# Patient Record
Sex: Male | Born: 1945 | Race: White | Hispanic: No | Marital: Married | Smoking: Never smoker
Health system: Southern US, Community
[De-identification: ages and names within clinical notes are randomized; demographics above are authoritative.]

## PROBLEM LIST (undated history)

## (undated) DIAGNOSIS — I10 Essential (primary) hypertension: Secondary | ICD-10-CM

## (undated) DIAGNOSIS — F329 Major depressive disorder, single episode, unspecified: Secondary | ICD-10-CM

## (undated) DIAGNOSIS — G3109 Other frontotemporal dementia: Secondary | ICD-10-CM

## (undated) DIAGNOSIS — F32A Depression, unspecified: Secondary | ICD-10-CM

## (undated) DIAGNOSIS — G1220 Motor neuron disease, unspecified: Secondary | ICD-10-CM

## (undated) DIAGNOSIS — C801 Malignant (primary) neoplasm, unspecified: Secondary | ICD-10-CM

## (undated) DIAGNOSIS — F0280 Dementia in other diseases classified elsewhere without behavioral disturbance: Secondary | ICD-10-CM

## (undated) HISTORY — DX: Major depressive disorder, single episode, unspecified: F32.9

## (undated) HISTORY — PX: SPINE SURGERY: SHX786

## (undated) HISTORY — DX: Malignant (primary) neoplasm, unspecified: C80.1

## (undated) HISTORY — DX: Depression, unspecified: F32.A

## (undated) HISTORY — DX: Essential (primary) hypertension: I10

---

## 2010-10-01 ENCOUNTER — Ambulatory Visit: Payer: Self-pay | Admitting: Family Medicine

## 2010-10-04 ENCOUNTER — Ambulatory Visit: Payer: Self-pay | Admitting: Family Medicine

## 2010-11-13 ENCOUNTER — Other Ambulatory Visit: Payer: Self-pay | Admitting: Rheumatology

## 2010-12-02 ENCOUNTER — Ambulatory Visit: Payer: Self-pay | Admitting: Family Medicine

## 2010-12-13 ENCOUNTER — Ambulatory Visit: Payer: Self-pay | Admitting: Family Medicine

## 2010-12-15 HISTORY — PX: SKIN CANCER EXCISION: SHX779

## 2011-12-30 DIAGNOSIS — F411 Generalized anxiety disorder: Secondary | ICD-10-CM | POA: Diagnosis not present

## 2011-12-30 DIAGNOSIS — M129 Arthropathy, unspecified: Secondary | ICD-10-CM | POA: Diagnosis not present

## 2011-12-30 DIAGNOSIS — Z23 Encounter for immunization: Secondary | ICD-10-CM | POA: Diagnosis not present

## 2011-12-30 DIAGNOSIS — G3109 Other frontotemporal dementia: Secondary | ICD-10-CM | POA: Diagnosis not present

## 2011-12-30 DIAGNOSIS — F3289 Other specified depressive episodes: Secondary | ICD-10-CM | POA: Diagnosis not present

## 2011-12-30 DIAGNOSIS — F0280 Dementia in other diseases classified elsewhere without behavioral disturbance: Secondary | ICD-10-CM | POA: Diagnosis not present

## 2011-12-30 DIAGNOSIS — F329 Major depressive disorder, single episode, unspecified: Secondary | ICD-10-CM | POA: Diagnosis not present

## 2012-02-13 DIAGNOSIS — F0280 Dementia in other diseases classified elsewhere without behavioral disturbance: Secondary | ICD-10-CM | POA: Diagnosis not present

## 2012-02-13 DIAGNOSIS — L989 Disorder of the skin and subcutaneous tissue, unspecified: Secondary | ICD-10-CM | POA: Diagnosis not present

## 2012-02-17 DIAGNOSIS — D485 Neoplasm of uncertain behavior of skin: Secondary | ICD-10-CM | POA: Diagnosis not present

## 2012-02-17 DIAGNOSIS — B359 Dermatophytosis, unspecified: Secondary | ICD-10-CM | POA: Diagnosis not present

## 2012-02-17 DIAGNOSIS — D1800 Hemangioma unspecified site: Secondary | ICD-10-CM | POA: Diagnosis not present

## 2012-02-17 DIAGNOSIS — L819 Disorder of pigmentation, unspecified: Secondary | ICD-10-CM | POA: Diagnosis not present

## 2012-02-17 DIAGNOSIS — L608 Other nail disorders: Secondary | ICD-10-CM | POA: Diagnosis not present

## 2012-02-24 DIAGNOSIS — L988 Other specified disorders of the skin and subcutaneous tissue: Secondary | ICD-10-CM | POA: Diagnosis not present

## 2012-02-24 DIAGNOSIS — D1800 Hemangioma unspecified site: Secondary | ICD-10-CM | POA: Diagnosis not present

## 2012-02-24 DIAGNOSIS — D485 Neoplasm of uncertain behavior of skin: Secondary | ICD-10-CM | POA: Diagnosis not present

## 2012-02-27 DIAGNOSIS — Z79899 Other long term (current) drug therapy: Secondary | ICD-10-CM | POA: Diagnosis not present

## 2012-02-27 DIAGNOSIS — B359 Dermatophytosis, unspecified: Secondary | ICD-10-CM | POA: Diagnosis not present

## 2012-04-06 DIAGNOSIS — L608 Other nail disorders: Secondary | ICD-10-CM | POA: Diagnosis not present

## 2012-04-06 DIAGNOSIS — R21 Rash and other nonspecific skin eruption: Secondary | ICD-10-CM | POA: Diagnosis not present

## 2012-04-06 DIAGNOSIS — Z79899 Other long term (current) drug therapy: Secondary | ICD-10-CM | POA: Diagnosis not present

## 2012-04-06 DIAGNOSIS — B359 Dermatophytosis, unspecified: Secondary | ICD-10-CM | POA: Diagnosis not present

## 2012-06-01 DIAGNOSIS — H6120 Impacted cerumen, unspecified ear: Secondary | ICD-10-CM | POA: Diagnosis not present

## 2012-07-08 DIAGNOSIS — Z1331 Encounter for screening for depression: Secondary | ICD-10-CM | POA: Diagnosis not present

## 2012-07-08 DIAGNOSIS — F3289 Other specified depressive episodes: Secondary | ICD-10-CM | POA: Diagnosis not present

## 2012-07-08 DIAGNOSIS — Z1339 Encounter for screening examination for other mental health and behavioral disorders: Secondary | ICD-10-CM | POA: Diagnosis not present

## 2012-07-08 DIAGNOSIS — Z0000 Encounter for general adult medical examination without abnormal findings: Secondary | ICD-10-CM | POA: Diagnosis not present

## 2012-07-08 DIAGNOSIS — F329 Major depressive disorder, single episode, unspecified: Secondary | ICD-10-CM | POA: Diagnosis not present

## 2012-07-08 DIAGNOSIS — Z23 Encounter for immunization: Secondary | ICD-10-CM | POA: Diagnosis not present

## 2012-08-09 DIAGNOSIS — Z23 Encounter for immunization: Secondary | ICD-10-CM | POA: Diagnosis not present

## 2012-08-09 DIAGNOSIS — F329 Major depressive disorder, single episode, unspecified: Secondary | ICD-10-CM | POA: Diagnosis not present

## 2012-08-09 DIAGNOSIS — G4730 Sleep apnea, unspecified: Secondary | ICD-10-CM | POA: Diagnosis not present

## 2012-08-09 DIAGNOSIS — F3289 Other specified depressive episodes: Secondary | ICD-10-CM | POA: Diagnosis not present

## 2012-08-17 DIAGNOSIS — F0280 Dementia in other diseases classified elsewhere without behavioral disturbance: Secondary | ICD-10-CM | POA: Diagnosis not present

## 2012-08-17 DIAGNOSIS — G3109 Other frontotemporal dementia: Secondary | ICD-10-CM | POA: Diagnosis not present

## 2012-11-01 DIAGNOSIS — Z23 Encounter for immunization: Secondary | ICD-10-CM | POA: Diagnosis not present

## 2012-11-01 DIAGNOSIS — J069 Acute upper respiratory infection, unspecified: Secondary | ICD-10-CM | POA: Diagnosis not present

## 2012-11-01 DIAGNOSIS — Z1331 Encounter for screening for depression: Secondary | ICD-10-CM | POA: Diagnosis not present

## 2013-01-11 DIAGNOSIS — F0390 Unspecified dementia without behavioral disturbance: Secondary | ICD-10-CM | POA: Diagnosis not present

## 2013-01-11 DIAGNOSIS — G4700 Insomnia, unspecified: Secondary | ICD-10-CM | POA: Diagnosis not present

## 2013-01-11 DIAGNOSIS — R5381 Other malaise: Secondary | ICD-10-CM | POA: Diagnosis not present

## 2013-01-11 DIAGNOSIS — F0280 Dementia in other diseases classified elsewhere without behavioral disturbance: Secondary | ICD-10-CM | POA: Diagnosis not present

## 2013-01-11 DIAGNOSIS — G3109 Other frontotemporal dementia: Secondary | ICD-10-CM | POA: Diagnosis not present

## 2013-01-11 DIAGNOSIS — R5383 Other fatigue: Secondary | ICD-10-CM | POA: Diagnosis not present

## 2013-01-11 DIAGNOSIS — Z79899 Other long term (current) drug therapy: Secondary | ICD-10-CM | POA: Diagnosis not present

## 2013-01-11 LAB — BASIC METABOLIC PANEL
BUN: 16 mg/dL (ref 4–21)
CREATININE: 1.2 mg/dL (ref 0.6–1.3)
GLUCOSE: 87 mg/dL
POTASSIUM: 4.2 mmol/L (ref 3.4–5.3)
SODIUM: 142 mmol/L (ref 137–147)

## 2013-01-11 LAB — HEPATIC FUNCTION PANEL
ALT: 20 U/L (ref 10–40)
AST: 14 U/L (ref 14–40)
Alkaline Phosphatase: 92 U/L (ref 25–125)

## 2013-01-11 LAB — CBC AND DIFFERENTIAL
HEMATOCRIT: 44 % (ref 41–53)
Hemoglobin: 15 g/dL (ref 13.5–17.5)
Neutrophils Absolute: 3 /uL
Platelets: 297 10*3/uL (ref 150–399)
WBC: 6.2 10^3/mL

## 2013-01-11 LAB — TSH: TSH: 0.81 u[IU]/mL (ref 0.41–5.90)

## 2013-03-15 DIAGNOSIS — F0280 Dementia in other diseases classified elsewhere without behavioral disturbance: Secondary | ICD-10-CM | POA: Diagnosis not present

## 2013-03-23 DIAGNOSIS — Z79899 Other long term (current) drug therapy: Secondary | ICD-10-CM | POA: Diagnosis not present

## 2013-03-23 DIAGNOSIS — G4700 Insomnia, unspecified: Secondary | ICD-10-CM | POA: Diagnosis not present

## 2013-03-23 DIAGNOSIS — J329 Chronic sinusitis, unspecified: Secondary | ICD-10-CM | POA: Diagnosis not present

## 2013-03-23 DIAGNOSIS — J209 Acute bronchitis, unspecified: Secondary | ICD-10-CM | POA: Diagnosis not present

## 2013-05-10 DIAGNOSIS — G3109 Other frontotemporal dementia: Secondary | ICD-10-CM | POA: Diagnosis not present

## 2013-05-10 DIAGNOSIS — I10 Essential (primary) hypertension: Secondary | ICD-10-CM | POA: Diagnosis not present

## 2013-05-10 DIAGNOSIS — G4730 Sleep apnea, unspecified: Secondary | ICD-10-CM | POA: Diagnosis not present

## 2013-05-10 DIAGNOSIS — M5412 Radiculopathy, cervical region: Secondary | ICD-10-CM | POA: Diagnosis not present

## 2013-05-10 DIAGNOSIS — F0280 Dementia in other diseases classified elsewhere without behavioral disturbance: Secondary | ICD-10-CM | POA: Diagnosis not present

## 2013-07-12 DIAGNOSIS — G3109 Other frontotemporal dementia: Secondary | ICD-10-CM | POA: Diagnosis not present

## 2013-07-12 DIAGNOSIS — E669 Obesity, unspecified: Secondary | ICD-10-CM | POA: Diagnosis not present

## 2013-07-12 DIAGNOSIS — J309 Allergic rhinitis, unspecified: Secondary | ICD-10-CM | POA: Diagnosis not present

## 2013-07-12 DIAGNOSIS — I10 Essential (primary) hypertension: Secondary | ICD-10-CM | POA: Diagnosis not present

## 2013-07-12 DIAGNOSIS — F0280 Dementia in other diseases classified elsewhere without behavioral disturbance: Secondary | ICD-10-CM | POA: Diagnosis not present

## 2014-03-20 DIAGNOSIS — G3101 Pick's disease: Secondary | ICD-10-CM | POA: Diagnosis not present

## 2014-03-20 DIAGNOSIS — F0280 Dementia in other diseases classified elsewhere without behavioral disturbance: Secondary | ICD-10-CM | POA: Diagnosis not present

## 2014-04-20 DIAGNOSIS — L989 Disorder of the skin and subcutaneous tissue, unspecified: Secondary | ICD-10-CM | POA: Diagnosis not present

## 2014-04-20 DIAGNOSIS — D1039 Benign neoplasm of other parts of mouth: Secondary | ICD-10-CM | POA: Diagnosis not present

## 2014-05-03 ENCOUNTER — Ambulatory Visit: Payer: Self-pay | Admitting: Family Medicine

## 2014-05-03 DIAGNOSIS — J189 Pneumonia, unspecified organism: Secondary | ICD-10-CM | POA: Diagnosis not present

## 2014-05-03 DIAGNOSIS — R509 Fever, unspecified: Secondary | ICD-10-CM | POA: Diagnosis not present

## 2014-05-03 DIAGNOSIS — R059 Cough, unspecified: Secondary | ICD-10-CM | POA: Diagnosis not present

## 2014-05-03 DIAGNOSIS — R079 Chest pain, unspecified: Secondary | ICD-10-CM | POA: Diagnosis not present

## 2014-05-03 DIAGNOSIS — R05 Cough: Secondary | ICD-10-CM | POA: Diagnosis not present

## 2014-05-03 DIAGNOSIS — J42 Unspecified chronic bronchitis: Secondary | ICD-10-CM | POA: Diagnosis not present

## 2014-06-08 DIAGNOSIS — R509 Fever, unspecified: Secondary | ICD-10-CM | POA: Diagnosis not present

## 2014-06-08 DIAGNOSIS — R059 Cough, unspecified: Secondary | ICD-10-CM | POA: Diagnosis not present

## 2014-06-08 DIAGNOSIS — J189 Pneumonia, unspecified organism: Secondary | ICD-10-CM | POA: Diagnosis not present

## 2014-06-08 DIAGNOSIS — R05 Cough: Secondary | ICD-10-CM | POA: Diagnosis not present

## 2014-06-08 DIAGNOSIS — R079 Chest pain, unspecified: Secondary | ICD-10-CM | POA: Diagnosis not present

## 2014-12-27 DIAGNOSIS — H9193 Unspecified hearing loss, bilateral: Secondary | ICD-10-CM | POA: Diagnosis not present

## 2014-12-27 DIAGNOSIS — G3109 Other frontotemporal dementia: Secondary | ICD-10-CM | POA: Diagnosis not present

## 2014-12-27 DIAGNOSIS — H6123 Impacted cerumen, bilateral: Secondary | ICD-10-CM | POA: Diagnosis not present

## 2015-03-21 DIAGNOSIS — F0280 Dementia in other diseases classified elsewhere without behavioral disturbance: Secondary | ICD-10-CM | POA: Diagnosis not present

## 2015-03-21 DIAGNOSIS — G3109 Other frontotemporal dementia: Secondary | ICD-10-CM | POA: Diagnosis not present

## 2015-05-21 ENCOUNTER — Other Ambulatory Visit: Payer: Self-pay | Admitting: Family Medicine

## 2015-05-21 DIAGNOSIS — G4700 Insomnia, unspecified: Secondary | ICD-10-CM

## 2015-05-21 MED ORDER — MIRTAZAPINE 15 MG PO TABS: 15.0000 mg | ORAL_TABLET | Freq: Every day | ORAL | Status: DC

## 2015-06-17 ENCOUNTER — Other Ambulatory Visit: Payer: Self-pay | Admitting: Family Medicine

## 2016-01-05 ENCOUNTER — Other Ambulatory Visit: Payer: Self-pay | Admitting: Family Medicine

## 2016-01-06 NOTE — Telephone Encounter (Signed)
Needs appointment

## 2016-02-27 ENCOUNTER — Other Ambulatory Visit: Payer: Self-pay | Admitting: Family Medicine

## 2016-02-27 NOTE — Telephone Encounter (Signed)
Pt needs appt for f/u--not seen in about 1 year

## 2016-03-12 ENCOUNTER — Other Ambulatory Visit: Payer: Self-pay | Admitting: Family Medicine

## 2016-03-12 ENCOUNTER — Ambulatory Visit (INDEPENDENT_AMBULATORY_CARE_PROVIDER_SITE_OTHER): Payer: Medicare Other | Admitting: Family Medicine

## 2016-03-12 VITALS — BP 122/84 | HR 68 | Temp 97.7°F | Resp 14 | Wt 281.0 lb

## 2016-03-12 DIAGNOSIS — F0280 Dementia in other diseases classified elsewhere without behavioral disturbance: Secondary | ICD-10-CM | POA: Insufficient documentation

## 2016-03-12 DIAGNOSIS — M5412 Radiculopathy, cervical region: Secondary | ICD-10-CM | POA: Insufficient documentation

## 2016-03-12 DIAGNOSIS — F329 Major depressive disorder, single episode, unspecified: Secondary | ICD-10-CM

## 2016-03-12 DIAGNOSIS — G3109 Other frontotemporal dementia: Secondary | ICD-10-CM

## 2016-03-12 DIAGNOSIS — I10 Essential (primary) hypertension: Secondary | ICD-10-CM | POA: Diagnosis not present

## 2016-03-12 DIAGNOSIS — H9193 Unspecified hearing loss, bilateral: Secondary | ICD-10-CM

## 2016-03-12 DIAGNOSIS — M353 Polymyalgia rheumatica: Secondary | ICD-10-CM

## 2016-03-12 DIAGNOSIS — G4730 Sleep apnea, unspecified: Secondary | ICD-10-CM | POA: Insufficient documentation

## 2016-03-12 DIAGNOSIS — J309 Allergic rhinitis, unspecified: Secondary | ICD-10-CM | POA: Insufficient documentation

## 2016-03-12 DIAGNOSIS — F0390 Unspecified dementia without behavioral disturbance: Secondary | ICD-10-CM | POA: Insufficient documentation

## 2016-03-12 DIAGNOSIS — M810 Age-related osteoporosis without current pathological fracture: Secondary | ICD-10-CM | POA: Insufficient documentation

## 2016-03-12 DIAGNOSIS — F32A Depression, unspecified: Secondary | ICD-10-CM | POA: Insufficient documentation

## 2016-03-12 DIAGNOSIS — G8929 Other chronic pain: Secondary | ICD-10-CM | POA: Insufficient documentation

## 2016-03-12 NOTE — Progress Notes (Signed)
Patient ID: Vincent Ayala, male   DOB: [birth year], 70 y.o.   MRN: [id number]    Subjective:  HPI  Hypertension, follow-up:  BP Readings from Last 3 Encounters:  12/27/14 124/82  03/12/16 122/84    He was last seen for hypertension 1 years ago.  BP at that visit was 124/82 . Management since that visit includes none. He reports good compliance with treatment. He is not having side effects.  He is not exercising. He is adherent to low salt diet.   Outside blood pressures are not being checked. He is experiencing none.  Patient denies chest pain, chest pressure/discomfort, claudication, dyspnea, exertional chest pressure/discomfort, fatigue, irregular heart beat, lower extremity edema, near-syncope, orthopnea and palpitations.    Wt Readings from Last 3 Encounters:  12/27/14 276 lb (125.193 kg)  03/12/16 281 lb (127.461 kg)  ------------------------------------------------------------------------   Depression- Pt reports that he feels depressed everyday. He is taking 150 mg Sertraline. Wife reports that she does not know that adjusting medications would help because he is just not used to not doing things that he used to.  Dementia- Wife reports that dementia has gotten worse and does not remember people, but feels like pt is doing well considering the amount of time he has had this disease.   Prior to Admission medications   Medication Sig Start Date End Date Taking? Authorizing Provider  ALPRAZolam Duanne Moron) 0.5 MG tablet Take 0.5 mg by mouth at bedtime as needed for anxiety.    Historical Provider, MD  calcium-vitamin D 250-100 MG-UNIT tablet Take 1 tablet by mouth 2 (two) times daily.    Historical Provider, MD  fluticasone (FLONASE) 50 MCG/ACT nasal spray Place into both nostrils daily.    Historical Provider, MD  lamoTRIgine (LAMICTAL) 100 MG tablet Take 100 mg by mouth 2 (two) times daily.    Historical Provider, MD  losartan (COZAAR) 50 MG tablet Take 50 mg by mouth daily.     Historical Provider, MD  Melatonin 10 MG CAPS Take by mouth.    Historical Provider, MD  memantine (NAMENDA) 10 MG tablet Take 10 mg by mouth 2 (two) times daily.    Historical Provider, MD  mirtazapine (REMERON) 15 MG tablet TAKE ONE TABLET BY MOUTH AT BEDTIME 05/21/15   Jerrol Banana., MD  mirtazapine (REMERON) 15 MG tablet Take 1 tablet (15 mg total) by mouth at bedtime. 05/21/15   Jerrol Banana., MD  Multiple Vitamin (MULTIVITAMIN) tablet Take 1 tablet by mouth daily.    Historical Provider, MD  sertraline (ZOLOFT) 100 MG tablet TAKE ONE & ONE-HALF TABLETS BY MOUTH ONCE DAILY 02/27/16   Jerrol Banana., MD    Patient Active Problem List   Diagnosis Date Noted  . Dementia 03/12/2016  . Depression 03/12/2016  . 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 Sleep apnea 03/12/2016  . Chronic pain 03/12/2016  . 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 Hypertension 03/12/2016  . Allergic rhinitis 03/12/2016  . Osteoporosis 03/12/2016  . Polymyalgia rheumatica ([place]) 03/12/2016  . Cervical radiculopathy 03/12/2016    No past medical history on file.  Social History   Social History  . Marital Status: Married    Spouse Name: N/A  . Number of Children: N/A  . Years of Education: N/A   Occupational History  . Not on file.   Social History Main Topics  . Smoking status: Not on file  . Smokeless tobacco: Not on file  . Alcohol Use: Not on  file  . Drug Use: Not on file  . Sexual Activity: Not on file   Other Topics Concern  . Not on file   Social History Narrative  . No narrative on file    Allergies  Allergen Reactions  . Penicillins     Review of Systems  Constitutional: Negative.   HENT: Negative.   Eyes: Negative.   Respiratory: Negative.   Cardiovascular: Negative.   Gastrointestinal: Negative.   Genitourinary: Negative.   Musculoskeletal: Negative.   Skin: Negative.   Neurological: Negative.   Endo/Heme/Allergies: Negative.     Psychiatric/Behavioral: Positive for depression and memory loss.     There is no immunization history on file for this patient. Objective:  BP 122/84 mmHg  Pulse 68  Temp(Src) 97.7 F (36.5 C) (Oral)  Resp 14  Wt 281 lb (127.461 kg)  Physical Exam  Constitutional: He is oriented to person, place, and time and well-developed, well-nourished, and in no distress.  HENT:  Head: Normocephalic and atraumatic.  Right Ear: External ear normal.  Left Ear: External ear normal.  Nose: Nose normal.  Eyes: Conjunctivae and EOM are normal. Pupils are equal, round, and reactive to light.  Neck: Normal range of motion. Neck supple.  Cardiovascular: Normal rate, regular rhythm, normal heart sounds and intact distal pulses.   Pulmonary/Chest: Effort normal and breath sounds normal.  Abdominal: Soft.  Musculoskeletal: Normal range of motion.  Neurological: He is alert and oriented to person, place, and time. He has normal reflexes. Gait normal. GCS score is 15.  Skin: Skin is warm and dry.  Psychiatric: Mood, affect and judgment normal.    No results found for: WBC, HGB, HCT, PLT, GLUCOSE, CHOL, TRIG, HDL, LDLDIRECT, LDLCALC, TSH, PSA, INR, GLUF, HGBA1C, MICROALBUR  CMP  No results found for: NA, K, CL, CO2, GLUCOSE, BUN, CREATININE, CALCIUM, PROT, ALBUMIN, AST, ALT, ALKPHOS, BILITOT, GFRNONAA, GFRAA  Assessment and Plan :  1. Frontotemporal dementia Progressive. Medications of controlled patient's outbursts of violence and temper. Followed by neurology,Dr Manuella Ghazi, who is doing a great job caring for this patient. More than 50% of this visit spent in counseling and overall management of disease states. 2. Essential hypertension Stable  - CBC with Differential/Platelet - TSH - Comprehensive metabolic panel  3. Sleep apnea Encouraged the use of CPAP. Due to dementia and did not think the patient will be able to cooperate with this request. 4. Depression 5. Poor dentition Strongly  recommended seeing dentist at least to consider pulling teeth and getting dentures. Patient was seen and examined by Dr. Miguel Aschoff, and noted scribed by Webb Laws, CMA I have done the exam and reviewed the above chart and it is accurate to the best of my knowledge.  Miguel Aschoff MD [facility] 03/12/2016 11:36 AM

## 2016-03-13 LAB — COMPREHENSIVE METABOLIC PANEL
ALK PHOS: 74 IU/L (ref 39–117)
ALT: 21 IU/L (ref 0–44)
AST: 18 IU/L (ref 0–40)
Albumin/Globulin Ratio: 1.8 (ref 1.2–2.2)
Albumin: 4.3 g/dL (ref 3.5–4.8)
BILIRUBIN TOTAL: 0.3 mg/dL (ref 0.0–1.2)
BUN / CREAT RATIO: 13 (ref 10–22)
BUN: 16 mg/dL (ref 8–27)
CHLORIDE: 102 mmol/L (ref 96–106)
CO2: 22 mmol/L (ref 18–29)
CREATININE: 1.27 mg/dL (ref 0.76–1.27)
Calcium: 9.3 mg/dL (ref 8.6–10.2)
GFR calc Af Amer: 66 mL/min/{1.73_m2} (ref >59)
GFR calc non Af Amer: 57 mL/min/{1.73_m2} — ABNORMAL LOW (ref >59)
GLUCOSE: 109 mg/dL — AB (ref 65–99)
Globulin, Total: 2.4 g/dL (ref 1.5–4.5)
Potassium: 4.1 mmol/L (ref 3.5–5.2)
Sodium: 142 mmol/L (ref 134–144)
Total Protein: 6.7 g/dL (ref 6.0–8.5)

## 2016-03-13 LAB — CBC WITH DIFFERENTIAL/PLATELET
BASOS ABS: 0.1 10*3/uL (ref 0.0–0.2)
Basos: 1 %
EOS (ABSOLUTE): 0.3 10*3/uL (ref 0.0–0.4)
Eos: 5 %
HEMOGLOBIN: 15 g/dL (ref 12.6–17.7)
Hematocrit: 44 % (ref 37.5–51.0)
Immature Grans (Abs): 0 10*3/uL (ref 0.0–0.1)
Immature Granulocytes: 0 %
LYMPHS ABS: 2.1 10*3/uL (ref 0.7–3.1)
Lymphs: 33 %
MCH: 31.1 pg (ref 26.6–33.0)
MCHC: 34.1 g/dL (ref 31.5–35.7)
MCV: 91 fL (ref 79–97)
MONOCYTES: 8 %
Monocytes Absolute: 0.5 10*3/uL (ref 0.1–0.9)
NEUTROS ABS: 3.4 10*3/uL (ref 1.4–7.0)
Neutrophils: 53 %
PLATELETS: 298 10*3/uL (ref 150–379)
RBC: 4.83 x10E6/uL (ref 4.14–5.80)
RDW: 14.1 % (ref 12.3–15.4)
WBC: 6.3 10*3/uL (ref 3.4–10.8)

## 2016-03-13 LAB — TSH: TSH: 0.898 u[IU]/mL (ref 0.450–4.500)

## 2016-03-19 DIAGNOSIS — G3109 Other frontotemporal dementia: Secondary | ICD-10-CM | POA: Diagnosis not present

## 2016-03-19 DIAGNOSIS — F0280 Dementia in other diseases classified elsewhere without behavioral disturbance: Secondary | ICD-10-CM | POA: Diagnosis not present

## 2016-03-19 DIAGNOSIS — F39 Unspecified mood [affective] disorder: Secondary | ICD-10-CM | POA: Diagnosis not present

## 2016-04-10 ENCOUNTER — Other Ambulatory Visit: Payer: Self-pay

## 2016-04-10 MED ORDER — SERTRALINE HCL 100 MG PO TABS: 150.0000 mg | ORAL_TABLET | Freq: Every day | ORAL | Status: DC

## 2016-05-04 ENCOUNTER — Other Ambulatory Visit: Payer: Self-pay | Admitting: Family Medicine

## 2016-05-05 ENCOUNTER — Other Ambulatory Visit: Payer: Self-pay

## 2016-05-05 MED ORDER — SERTRALINE HCL 100 MG PO TABS: 150.0000 mg | ORAL_TABLET | Freq: Every day | ORAL | Status: DC

## 2016-06-06 ENCOUNTER — Other Ambulatory Visit: Payer: Self-pay | Admitting: Family Medicine

## 2016-06-18 ENCOUNTER — Other Ambulatory Visit: Payer: Self-pay | Admitting: Family Medicine

## 2016-08-07 ENCOUNTER — Other Ambulatory Visit: Payer: Self-pay

## 2016-08-07 MED ORDER — SERTRALINE HCL 100 MG PO TABS: 150.0000 mg | ORAL_TABLET | Freq: Every day | ORAL | 3 refills | Status: DC

## 2016-08-07 MED ORDER — MIRTAZAPINE 15 MG PO TABS: 15.0000 mg | ORAL_TABLET | Freq: Every day | ORAL | 1 refills | Status: DC

## 2016-08-07 MED ORDER — LOSARTAN POTASSIUM 50 MG PO TABS: 50.0000 mg | ORAL_TABLET | Freq: Every day | ORAL | 3 refills | Status: DC

## 2016-08-07 NOTE — Addendum Note (Signed)
Addended by: Arnette Norris on: 08/07/2016 10:40 AM   Modules accepted: Orders

## 2016-08-07 NOTE — Addendum Note (Signed)
Addended by: Arnette Norris on: 08/07/2016 10:41 AM   Modules accepted: Orders

## 2016-10-01 ENCOUNTER — Encounter: Payer: Self-pay | Admitting: Family Medicine

## 2016-10-01 ENCOUNTER — Ambulatory Visit (INDEPENDENT_AMBULATORY_CARE_PROVIDER_SITE_OTHER): Payer: Medicare Other | Admitting: Family Medicine

## 2016-10-01 VITALS — BP 128/78 | HR 78 | Temp 98.0°F | Resp 16 | Ht 76.0 in | Wt 281.0 lb

## 2016-10-01 DIAGNOSIS — Z0000 Encounter for general adult medical examination without abnormal findings: Secondary | ICD-10-CM

## 2016-10-01 DIAGNOSIS — Z23 Encounter for immunization: Secondary | ICD-10-CM

## 2016-10-01 NOTE — Progress Notes (Signed)
Patient: Vincent Ayala, Male    DOB: [birth year], 70 y.o.   MRN: [id number] Visit Date: 10/01/2016  Today's Provider: Wilhemena Durie, MD   Chief Complaint  Patient presents with  . Annual Wellness Exam   Subjective:    Annual wellness visit Vincent Ayala is a 70 y.o. male. He feels well. He reports exercising none. He reports he is sleeping fairly well.  Colonoscopy- never.    Review of Systems  Constitutional: Negative.   HENT: Negative.   Eyes: Negative.   Respiratory: Negative.   Cardiovascular: Negative.   Gastrointestinal: Negative.   Endocrine: Negative.   Genitourinary: Negative.   Musculoskeletal: Negative.   Skin: Negative.   Allergic/Immunologic: Negative.   Neurological: Negative.   Hematological: Negative.   Psychiatric/Behavioral: Negative.     Social History   Social History  . Marital status: Married    Spouse name: N/A  . Number of children: N/A  . Years of education: N/A   Occupational History  . Not on file.   Social History Main Topics  . Smoking status: Never Smoker  . Smokeless tobacco: Not on file  . Alcohol use Not on file  . Drug use: Unknown  . Sexual activity: Not on file   Other Topics Concern  . Not on file   Social History Narrative  . No narrative on file    No past medical history on file.   Patient Active Problem List   Diagnosis Date Noted  . Dementia 03/12/2016  . Depression 03/12/2016  . 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 Sleep apnea 03/12/2016  . Chronic pain 03/12/2016  . 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 Hypertension 03/12/2016  . Allergic rhinitis 03/12/2016  . Osteoporosis 03/12/2016  . Polymyalgia rheumatica ([place]) 03/12/2016  . Cervical radiculopathy 03/12/2016    Past Surgical History:  Procedure Laterality Date  . 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 SPINE SURGERY     ruptured disc    His family history is not  on file.    Current Meds  Medication Sig  . ALPRAZolam (XANAX) 0.5 MG tablet Take 0.5 mg by mouth at bedtime as needed for anxiety.  . calcium-vitamin D 250-100 MG-UNIT tablet Take 1 tablet by mouth 2 (two) times daily.  Marland Kitchen lamoTRIgine (LAMICTAL) 100 MG tablet Take 100 mg by mouth 2 (two) times daily.  Marland Kitchen losartan (COZAAR) 50 MG tablet Take 1 tablet (50 mg total) by mouth daily.  . Melatonin 10 MG CAPS Take by mouth.  . memantine (NAMENDA) 10 MG tablet Take 10 mg by mouth 2 (two) times daily.  . mirtazapine (REMERON) 15 MG tablet Take 1 tablet (15 mg total) by mouth at bedtime.  . Multiple Vitamin (MULTIVITAMIN) tablet Take 1 tablet by mouth daily.  . sertraline (ZOLOFT) 100 MG tablet Take 1.5 tablets (150 mg total) by mouth daily.    Patient Care Team: Jerrol Banana., MD as PCP - General (Family Medicine)    Objective:   Vitals: BP 128/78 (BP Location: Left Arm, Patient Position: Sitting, Cuff Size: Normal)   Pulse 78   Temp 98 F (36.7 C)   Resp 16   Ht 6\' 4"  (1.93 m)   Wt 281 lb (127.5 kg)   BMI 34.20 kg/m   Physical Exam  Constitutional: He is oriented to person, place, and time. He appears well-developed and well-nourished.  HENT:  Head: Normocephalic and atraumatic.  Right Ear: External ear normal.  Left Ear: External ear normal.  Nose: Nose normal.  Mouth/Throat: Oropharynx is clear and moist.  Bilateral TMs occluded with wax.  Patient has poor dentition  Eyes: Conjunctivae and EOM are normal. Pupils are equal, round, and reactive to light.  Neck: Normal range of motion. Neck supple.  Cardiovascular: Normal rate, regular rhythm, normal heart sounds and intact distal pulses.   Pulmonary/Chest: Effort normal and breath sounds normal.  Abdominal: Soft. Bowel sounds are normal.  Musculoskeletal: Normal range of motion.  Neurological: He is alert and oriented to person, place, and time.  Skin: Skin is warm and dry.  Psychiatric: He has a normal mood and affect.  His behavior is normal.    Activities of Daily Living In your present state of health, do you have any difficulty performing the following activities: 10/01/2016 03/12/2016  Hearing? Y N  Vision? N N  Difficulty concentrating or making decisions? Tempie Donning  Walking or climbing stairs? N N  Dressing or bathing? N N  Doing errands, shopping? Y Y  Some recent data might be hidden    Fall Risk Assessment Fall Risk  10/01/2016 03/12/2016  Falls in the past year? No No     Depression Screen PHQ 2/9 Scores 10/01/2016 03/12/2016  PHQ - 2 Score 0 6    Cognitive Testing - 6-CIT  Correct? Score   What year is it? no 4 0 or 4  What month is it? no 3 0 or 3  Memorize:    Vincent Ayala,  41,  [address]  [place],      What time is it? (within 1 hour) no 3 0 or 3  Count backwards from 20 yes 0 0, 2, or 4  Name the months of the year no 4 0, 2, or 4  Repeat name & address above no 8 0, 2, 4, 6, 8, or 10       TOTAL SCORE  22/28   Interpretation:  Abnormal- 22  Normal (0-7) Abnormal (8-28)       Assessment & Plan:     Annual Wellness Visit  Reviewed patient's Family Medical History Reviewed and updated list of patient's medical providers Assessment of cognitive impairment was done Assessed patient's functional ability Established a written schedule for health screening [place] Completed and Reviewed  Exercise Activities and Dietary recommendations Goals    None      Immunization History  Administered Date(s) Administered  . Influenza, High Dose Seasonal PF 10/01/2016  . Pneumococcal Polysaccharide-23 12/30/2011  . Td 07/20/1995    Health Maintenance  Topic Date Due  . Hepatitis C Screening  03-24-1946  . COLONOSCOPY  07/05/1995  . ZOSTAVAX  07/04/2005  . TETANUS/TDAP  07/19/2005  . PNA vac Low Risk Adult (2 of 2 - PCV13) 12/29/2012  . INFLUENZA VACCINE  10/12/2016 (Originally 07/15/2016)      Discussed health benefits of physical activity, and  encouraged him to engage in regular exercise appropriate for his age and condition.   1. Medicare annual wellness visit, subsequent   2. Need for influenza vaccination  - Flu vaccine HIGH DOSE PF (Fluzone High dose)    I have done the exam and reviewed the above chart and it is accurate to the best of my knowledge.  Anayla Giannetti Cranford Mon, MD  [facility]

## 2016-10-01 NOTE — Patient Instructions (Signed)
May start Debrox ear drops once a week to soften wax in both ears.

## 2016-12-17 ENCOUNTER — Other Ambulatory Visit: Payer: Self-pay

## 2016-12-17 MED ORDER — MIRTAZAPINE 15 MG PO TABS: 15.0000 mg | ORAL_TABLET | Freq: Every day | ORAL | 1 refills | Status: DC

## 2016-12-17 NOTE — Telephone Encounter (Signed)
Fax from [facility] stating patient changed pharmacy and they need RX refill for Mirtazapine sent over-aa

## 2017-03-30 DIAGNOSIS — F0280 Dementia in other diseases classified elsewhere without behavioral disturbance: Secondary | ICD-10-CM | POA: Diagnosis not present

## 2017-03-30 DIAGNOSIS — G479 Sleep disorder, unspecified: Secondary | ICD-10-CM | POA: Diagnosis not present

## 2017-03-30 DIAGNOSIS — F39 Unspecified mood [affective] disorder: Secondary | ICD-10-CM | POA: Diagnosis not present

## 2017-03-30 DIAGNOSIS — G3109 Other frontotemporal dementia: Secondary | ICD-10-CM | POA: Diagnosis not present

## 2017-04-01 ENCOUNTER — Encounter: Payer: Self-pay | Admitting: Family Medicine

## 2017-04-01 ENCOUNTER — Ambulatory Visit (INDEPENDENT_AMBULATORY_CARE_PROVIDER_SITE_OTHER): Payer: PPO | Admitting: Family Medicine

## 2017-04-01 VITALS — BP 112/66 | HR 84 | Temp 97.4°F | Resp 16 | Wt 282.0 lb

## 2017-04-01 DIAGNOSIS — I10 Essential (primary) hypertension: Secondary | ICD-10-CM | POA: Diagnosis not present

## 2017-04-01 DIAGNOSIS — Z23 Encounter for immunization: Secondary | ICD-10-CM | POA: Diagnosis not present

## 2017-04-01 DIAGNOSIS — R2681 Unsteadiness on feet: Secondary | ICD-10-CM | POA: Diagnosis not present

## 2017-04-01 DIAGNOSIS — G3109 Other frontotemporal dementia: Secondary | ICD-10-CM | POA: Diagnosis not present

## 2017-04-01 DIAGNOSIS — L989 Disorder of the skin and subcutaneous tissue, unspecified: Secondary | ICD-10-CM

## 2017-04-01 DIAGNOSIS — F3289 Other specified depressive episodes: Secondary | ICD-10-CM

## 2017-04-01 DIAGNOSIS — F0280 Dementia in other diseases classified elsewhere without behavioral disturbance: Secondary | ICD-10-CM | POA: Diagnosis not present

## 2017-04-01 DIAGNOSIS — Z13220 Encounter for screening for lipoid disorders: Secondary | ICD-10-CM

## 2017-04-01 NOTE — Progress Notes (Addendum)
Patient: Vincent Ayala. [place] Male    DOB: January 28, 1946   71 y.o.   MRN: [id number] Visit Date: 04/01/2017  Today's Provider: Wilhemena Durie, MD   Chief Complaint  Patient presents with  . Hypertension  . Depression   Subjective:    HPI      Hypertension, follow-up:  BP Readings from Last 3 Encounters:  04/01/17 112/66  10/01/16 128/78  12/27/14 124/82    He was last seen for hypertension 6 months ago.  BP at that visit was 128/78. Management since that visit includes none. He reports excellent compliance with treatment. He is not having side effects.  He is not exercising. He is adherent to low salt diet.   Outside blood pressures are not being checked. He is experiencing fatigue.  Patient denies chest pain, chest pressure/discomfort, claudication, dyspnea, exertional chest pressure/discomfort, irregular heart beat, lower extremity edema, near-syncope, orthopnea, palpitations and syncope.   Cardiovascular risk factors include advanced age (older than 67 for men, 39 for women), hypertension, male gender and obesity (BMI >= 30 kg/m2).    Weight trend: increasing steadily Wt Readings from Last 3 Encounters:  04/01/17 282 lb (127.9 kg)  10/01/16 281 lb (127.5 kg)  12/27/14 276 lb (125.2 kg)    Current diet: well balanced. Wife states pt eats a cheeseburger from [employer] at least once a day. Pt also has a sweet tooth. Pt eats one danish and two honey buns every day.  ------------------------------------------------------------------------  Depression, Follow-up  He  was last seen for this 6 months ago. Changes made at last visit include none.   He reports excellent compliance with treatment. He is not having side effects.   He reports excellent tolerance of treatment. Current symptoms include: fatigue He feels he is Unchanged since last visit. Pt was having insomnia. Pt saw neurology earlier this week, and Vincent Ayala decreased pt's Mirtazapine to 7.5 mg  po qhs, and increased his Melatonin to 6 mg po qhs. Pt is now sleeping better.  ------------------------------------------------------------------------ Wife reports that pt's gait is becoming unsteady. He is now shuffling his feet. Denies falls.   Allergies  Allergen Reactions  . Penicillins      Current Outpatient Prescriptions:  .  calcium-vitamin D 250-100 MG-UNIT tablet, Take 1 tablet by mouth 2 (two) times daily., Disp: , Rfl:  .  lamoTRIgine (LAMICTAL) 100 MG tablet, Take 100 mg by mouth 2 (two) times daily., Disp: , Rfl:  .  losartan (COZAAR) 50 MG tablet, Take 1 tablet (50 mg total) by mouth daily., Disp: 90 tablet, Rfl: 3 .  Melatonin 3 MG TABS, Take 6 mg by mouth at bedtime. , Disp: , Rfl:  .  memantine (NAMENDA) 10 MG tablet, Take 10 mg by mouth 2 (two) times daily., Disp: , Rfl:  .  mirtazapine (REMERON) 15 MG tablet, Take 1 tablet (15 mg total) by mouth at bedtime. (Patient taking differently: Take 7.5 mg by mouth at bedtime. ), Disp: 90 tablet, Rfl: 1 .  Multiple Vitamin (MULTIVITAMIN) tablet, Take 1 tablet by mouth daily., Disp: , Rfl:  .  sertraline (ZOLOFT) 100 MG tablet, Take 1.5 tablets (150 mg total) by mouth daily., Disp: 150 tablet, Rfl: 3  Review of Systems  Constitutional: Positive for fatigue and unexpected weight change (gain). Negative for activity change, appetite change, chills, diaphoresis and fever.  HENT: Negative.   Eyes: Negative.   Respiratory: Negative for shortness of breath.   Cardiovascular: Negative for chest pain, palpitations and  leg swelling.  Endocrine: Negative.   Musculoskeletal: Negative.   Allergic/Immunologic: Negative.   Hematological: Negative.   Psychiatric/Behavioral: Negative for dysphoric mood, sleep disturbance and suicidal ideas.    Social History  Substance Use Topics  . Smoking status: Never Smoker  . Smokeless tobacco: Never Used  . Alcohol use No   Objective:   BP 112/66 (BP Location: Left Arm, Patient Position:  Sitting, Cuff Size: Large)   Pulse 84   Temp 97.4 F (36.3 C) (Oral)   Resp 16   Wt 282 lb (127.9 kg)   BMI 34.33 kg/m  Vitals:   04/01/17 1352  BP: 112/66  Pulse: 84  Resp: 16  Temp: 97.4 F (36.3 C)  TempSrc: Oral  Weight: 282 lb (127.9 kg)     Physical Exam  Constitutional: He appears well-developed and well-nourished.  HENT:  Head: Normocephalic and atraumatic.  Right Ear: External ear normal.  Left Ear: External ear normal.  Nose: Nose normal.  Eyes: Conjunctivae are normal.  Neck: Normal range of motion.  Cardiovascular: Normal rate, regular rhythm and normal heart sounds.   No carotid bruit  Pulmonary/Chest: Effort normal and breath sounds normal. No respiratory distress.  Abdominal: Soft.  Neurological: He is alert. No cranial nerve deficit. He exhibits normal muscle tone. Coordination normal.  Gait normal in office today.  Skin: Skin is warm and dry.  Left preauricular lesion c/w BCC.  Psychiatric: His behavior is normal.        Assessment & Plan:     1. Essential hypertension Stable. FU pending labs. - CBC with Differential/Platelet - Comprehensive metabolic panel - TSH  2. Other depression Stable.  3. Frontotemporal dementia F/B neuro. FU as scheduled.  4. Unsteady gait Discussed falls preventions.  5. Screening for cholesterol level FY pending results. - Lipid panel  6. Skin lesion Refer to R/O basal cell carcinoma. - Ambulatory referral to Dermatology  7. Need for pneumococcal vaccination Administered today. Tdap due. Advised pt and wife to call to schedule if pt has laceration. - Pneumococcal conjugate vaccine 13-valent IM     Patient seen and examined by Miguel Aschoff, MD, and note scribed by Renaldo Fiddler, CMA. I have done the exam and reviewed the above chart and it is accurate to the best of my knowledge. Development worker, community has been used in this note in any air is in the dictation or transcription are  unintentional.  Wilhemena Durie, MD  [facility] [facility]

## 2017-04-27 DIAGNOSIS — D2262 Melanocytic nevi of left upper limb, including shoulder: Secondary | ICD-10-CM | POA: Diagnosis not present

## 2017-04-27 DIAGNOSIS — D225 Melanocytic nevi of trunk: Secondary | ICD-10-CM | POA: Diagnosis not present

## 2017-04-27 DIAGNOSIS — D2261 Melanocytic nevi of right upper limb, including shoulder: Secondary | ICD-10-CM | POA: Diagnosis not present

## 2017-04-27 DIAGNOSIS — C44219 Basal cell carcinoma of skin of left ear and external auricular canal: Secondary | ICD-10-CM | POA: Diagnosis not present

## 2017-04-27 DIAGNOSIS — C44519 Basal cell carcinoma of skin of other part of trunk: Secondary | ICD-10-CM | POA: Diagnosis not present

## 2017-04-27 DIAGNOSIS — D485 Neoplasm of uncertain behavior of skin: Secondary | ICD-10-CM | POA: Diagnosis not present

## 2017-06-02 ENCOUNTER — Other Ambulatory Visit: Payer: Self-pay | Admitting: Family Medicine

## 2017-06-19 DIAGNOSIS — L905 Scar conditions and fibrosis of skin: Secondary | ICD-10-CM | POA: Diagnosis not present

## 2017-06-19 DIAGNOSIS — C44319 Basal cell carcinoma of skin of other parts of face: Secondary | ICD-10-CM | POA: Diagnosis not present

## 2017-06-26 DIAGNOSIS — C44519 Basal cell carcinoma of skin of other part of trunk: Secondary | ICD-10-CM | POA: Diagnosis not present

## 2017-07-09 ENCOUNTER — Ambulatory Visit (INDEPENDENT_AMBULATORY_CARE_PROVIDER_SITE_OTHER): Payer: PPO

## 2017-07-09 VITALS — BP 132/78 | HR 72 | Temp 98.9°F | Ht 76.0 in | Wt 274.4 lb

## 2017-07-09 DIAGNOSIS — Z0000 Encounter for general adult medical examination without abnormal findings: Secondary | ICD-10-CM

## 2017-07-09 NOTE — Progress Notes (Signed)
Subjective:   Vincent Ayala is a 71 y.o. male who presents for Medicare Annual/Subsequent preventive examination.  Review of Systems:  N/A  Cardiac Risk Factors include: advanced age (>80men, >57 women);hypertension;male gender;obesity (BMI >30kg/m2)     Objective:    Vitals: BP 132/78 (BP Location: Right Arm)   Pulse 72   Temp 98.9 F (37.2 C) (Oral)   Ht 6\' 4"  (1.93 m)   Wt 274 lb 6.4 oz (124.5 kg)   BMI 33.40 kg/m   Body mass index is 33.4 kg/m.  Tobacco History  Smoking Status  . Never Smoker  Smokeless Tobacco  . Never Used     Counseling given: Not Answered   Past Medical History:  Diagnosis Date  . Cancer ([place])    [place] on left earlobe and back   Past Surgical History:  Procedure Laterality Date  . 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 SPINE SURGERY     ruptured disc   History reviewed. No pertinent family history. History  Sexual Activity  . Sexual activity: Not on file    Outpatient Encounter Prescriptions as of 07/09/2017  Medication Sig  . calcium-vitamin D 250-100 MG-UNIT tablet Take 1 tablet by mouth 2 (two) times daily.  Marland Kitchen lamoTRIgine (LAMICTAL) 100 MG tablet Take 100 mg by mouth 2 (two) times daily.  Marland Kitchen losartan (COZAAR) 50 MG tablet TAKE ONE TABLET EVERY DAY  . Melatonin 3 MG TABS Take 6 mg by mouth at bedtime.   . memantine (NAMENDA) 10 MG tablet Take 10 mg by mouth daily.   . mirtazapine (REMERON) 15 MG tablet Take 1 tablet (15 mg total) by mouth at bedtime.  . Multiple Vitamin (MULTIVITAMIN) tablet Take 1 tablet by mouth daily.  . sertraline (ZOLOFT) 100 MG tablet TAKE 1 AND 1/2 TABLET EVERY DAY   No facility-administered encounter medications on file as of 07/09/2017.     Activities of Daily Living In your present state of health, do you have any difficulty performing the following activities: 07/09/2017 10/01/2016  Hearing? Tempie Donning  Vision? N N  Difficulty concentrating or making  decisions? Tempie Donning  Walking or climbing stairs? Y N  Dressing or bathing? N N  Doing errands, shopping? Tempie Donning  Preparing Food and eating ? N -  Using the Toilet? N -  In the past six months, have you accidently leaked urine? N -  Do you have problems with loss of bowel control? N -  Managing your Medications? Y -  Managing your Finances? Y -  Housekeeping or managing your Housekeeping? N -  Some recent data might be hidden    Patient Care Team: Jerrol Banana., MD as PCP - General (Family Medicine) Vladimir Crofts, MD as Consulting Physician (Neurology) Oneta Rack, MD as Consulting Physician (Dermatology)   Assessment:     Exercise Activities and Dietary recommendations Current Exercise Habits: The patient does not participate in regular exercise at present  Goals    . Increase water intake          Recommend increasing water intake to 3 glasses a day.       Fall Risk Fall Risk  07/09/2017 10/01/2016 03/12/2016  Falls in the past year? No No No   Depression Screen PHQ 2/9 Scores 07/09/2017 10/01/2016 03/12/2016  PHQ - 2 Score 2 0 6  PHQ- 9 Score 8 - -    Cognitive  Function- declined screening today.        Immunization History  Administered Date(s) Administered  . Influenza, High Dose Seasonal PF 10/01/2016  . Pneumococcal Conjugate-13 04/01/2017  . Pneumococcal Polysaccharide-23 12/30/2011  . Td 07/20/1995   Screening Tests Health Maintenance  Topic Date Due  . Hepatitis C Screening  10/06/2017 (Originally 1946/03/15)  . COLONOSCOPY  12/15/2026 (Originally 07/05/1995)  . TETANUS/TDAP  12/15/2026 (Originally 07/19/2005)  . INFLUENZA VACCINE  07/15/2017  . PNA vac Low Risk Adult  Completed      Plan:  I have personally reviewed and addressed the Medicare Annual Wellness questionnaire and have noted the following in the patient's chart:  A. Medical and social history B. Use of alcohol, tobacco or illicit drugs  C. Current medications and  supplements D. Functional ability and status E.  Nutritional status F.  Physical activity G. Advance directives H. List of other physicians I.  Hospitalizations, surgeries, and ER visits in previous 12 months J.  [place] such as hearing and vision if needed, cognitive and depression L. Referrals and appointments - none  In addition, I have reviewed and discussed with patient certain preventive protocols, quality metrics, and best practice recommendations. A written personalized care plan for preventive services as well as general preventive health recommendations were provided to patient.  See attached scanned questionnaire for additional information.   Signed,  Fabio Neighbors, LPN Nurse Health Advisor   MD Recommendations: Pt would like to have the Hepatitis C screening done at next OV on 10/07/17. Pt declined colonoscopy referral and tetanus vaccine today.

## 2017-07-09 NOTE — Patient Instructions (Signed)
Vincent Ayala , Thank you for taking time to come for your Medicare Wellness Visit. I appreciate your ongoing commitment to your health goals. Please review the following plan we discussed and let me know if I can assist you in the future.   Screening recommendations/referrals: Colonoscopy: declined Recommended yearly ophthalmology/optometry visit for glaucoma screening and checkup Recommended yearly dental visit for hygiene and checkup  Vaccinations: Influenza vaccine: due 08/2017 Pneumococcal vaccine: completed series Tdap vaccine: declined Shingles vaccine: declined   Advanced directives: Please bring a copy of your POA (Power of Attorney) and/or Living Will to your next appointment.   Conditions/risks identified: Recommend increasing water intake to 3 glasses a day.   Next appointment: 10/07/17  Preventive Care 72 Years and Older, Male Preventive care refers to lifestyle choices and visits with your health care provider that can promote health and wellness. What does preventive care include?  A yearly physical exam. This is also called an annual well check.  Dental exams once or twice a year.  Routine eye exams. Ask your health care provider how often you should have your eyes checked.  Personal lifestyle choices, including:  Daily care of your teeth and gums.  Regular physical activity.  Eating a healthy diet.  Avoiding tobacco and drug use.  Limiting alcohol use.  Practicing safe sex.  Taking low doses of aspirin every day.  Taking vitamin and mineral supplements as recommended by your health care provider. What happens during an annual well check? The services and screenings done by your health care provider during your annual well check will depend on your age, overall health, lifestyle risk factors, and family history of disease. Counseling  Your health care provider may ask you questions about your:  Alcohol use.  Tobacco use.  Drug use.  Emotional  well-being.  Home and relationship well-being.  Sexual activity.  Eating habits.  History of falls.  Memory and ability to understand (cognition).  Work and work Statistician. Screening  You may have the following tests or measurements:  Height, weight, and BMI.  Blood pressure.  Lipid and cholesterol levels. These may be checked every 5 years, or more frequently if you are over 38 years old.  Skin check.  Lung cancer screening. You may have this screening every year starting at age 69 if you have a 30-pack-year history of smoking and currently smoke or have quit within the past 15 years.  Fecal occult blood test (FOBT) of the stool. You may have this test every year starting at age 21.  Flexible sigmoidoscopy or colonoscopy. You may have a sigmoidoscopy every 5 years or a colonoscopy every 10 years starting at age 35.  Prostate cancer screening. Recommendations will vary depending on your family history and other risks.  Hepatitis C blood test.  Hepatitis B blood test.  Sexually transmitted disease (STD) testing.  Diabetes screening. This is done by checking your blood sugar (glucose) after you have not eaten for a while (fasting). You may have this done every 1-3 years.  Abdominal aortic aneurysm (AAA) screening. You may need this if you are a current or former smoker.  Osteoporosis. You may be screened starting at age 69 if you are at high risk. Talk with your health care provider about your test results, treatment options, and if necessary, the need for more tests. Vaccines  Your health care provider may recommend certain vaccines, such as:  Influenza vaccine. This is recommended every year.  Tetanus, diphtheria, and acellular pertussis (Tdap, Td) vaccine.  You may need a Td booster every 10 years.  Zoster vaccine. You may need this after age 64.  Pneumococcal 13-valent conjugate (PCV13) vaccine. One dose is recommended after age 60.  Pneumococcal  polysaccharide (PPSV23) vaccine. One dose is recommended after age 23. Talk to your health care provider about which screenings and vaccines you need and how often you need them. This information is not intended to replace advice given to you by your health care provider. Make sure you discuss any questions you have with your health care provider. Document Released: 12/28/2015 Document Revised: 08/20/2016 Document Reviewed: 10/02/2015 Elsevier Interactive Patient Education  2017 [employer] Prevention in the Home Falls can cause injuries. They can happen to people of all ages. There are many things you can do to make your home safe and to help prevent falls. What can I do on the outside of my home?  Regularly fix the edges of walkways and driveways and fix any cracks.  Remove anything that might make you trip as you walk through a door, such as a raised step or threshold.  Trim any bushes or trees on the path to your home.  Use bright outdoor lighting.  Clear any walking paths of anything that might make someone trip, such as rocks or tools.  Regularly check to see if handrails are loose or broken. Make sure that both sides of any steps have handrails.  Any raised decks and porches should have guardrails on the edges.  Have any leaves, snow, or ice cleared regularly.  Use sand or salt on walking paths during winter.  Clean up any spills in your garage right away. This includes oil or grease spills. What can I do in the bathroom?  Use night lights.  Install grab bars by the toilet and in the tub and shower. Do not use towel bars as grab bars.  Use non-skid mats or decals in the tub or shower.  If you need to sit down in the shower, use a plastic, non-slip stool.  Keep the floor dry. Clean up any water that spills on the floor as soon as it happens.  Remove soap buildup in the tub or shower regularly.  Attach bath mats securely with double-sided non-slip rug  tape.  Do not have throw rugs and other things on the floor that can make you trip. What can I do in the bedroom?  Use night lights.  Make sure that you have a light by your bed that is easy to reach.  Do not use any sheets or blankets that are too big for your bed. They should not hang down onto the floor.  Have a firm chair that has side arms. You can use this for support while you get dressed.  Do not have throw rugs and other things on the floor that can make you trip. What can I do in the kitchen?  Clean up any spills right away.  Avoid walking on wet floors.  Keep items that you use a lot in easy-to-reach places.  If you need to reach something above you, use a strong step stool that has a grab bar.  Keep electrical cords out of the way.  Do not use floor polish or wax that makes floors slippery. If you must use wax, use non-skid floor wax.  Do not have throw rugs and other things on the floor that can make you trip. What can I do with my stairs?  Do not leave any  items on the stairs.  Make sure that there are handrails on both sides of the stairs and use them. Fix handrails that are broken or loose. Make sure that handrails are as long as the stairways.  Check any carpeting to make sure that it is firmly attached to the stairs. Fix any carpet that is loose or worn.  Avoid having throw rugs at the top or bottom of the stairs. If you do have throw rugs, attach them to the floor with carpet tape.  Make sure that you have a light switch at the top of the stairs and the bottom of the stairs. If you do not have them, ask someone to add them for you. What else can I do to help prevent falls?  Wear shoes that:  Do not have high heels.  Have rubber bottoms.  Are comfortable and fit you well.  Are closed at the toe. Do not wear sandals.  If you use a stepladder:  Make sure that it is fully opened. Do not climb a closed stepladder.  Make sure that both sides of the  stepladder are locked into place.  Ask someone to hold it for you, if possible.  Clearly mark and make sure that you can see:  Any grab bars or handrails.  First and last steps.  Where the edge of each step is.  Use tools that help you move around (mobility aids) if they are needed. These include:  Canes.  Walkers.  Scooters.  Crutches.  Turn on the lights when you go into a dark area. Replace any light bulbs as soon as they burn out.  Set up your furniture so you have a clear path. Avoid moving your furniture around.  If any of your floors are uneven, fix them.  If there are any pets around you, be aware of where they are.  Review your medicines with your doctor. Some medicines can make you feel dizzy. This can increase your chance of falling. Ask your doctor what other things that you can do to help prevent falls. This information is not intended to replace advice given to you by your health care provider. Make sure you discuss any questions you have with your health care provider. Document Released: 09/27/2009 Document Revised: 05/08/2016 Document Reviewed: 01/05/2015 Elsevier Interactive Patient Education  2017 [employer].

## 2017-08-01 ENCOUNTER — Encounter: Payer: Self-pay | Admitting: Emergency Medicine

## 2017-08-01 ENCOUNTER — Emergency Department
Admission: EM | Admit: 2017-08-01 | Discharge: 2017-08-02 | Disposition: A | Discharge status: Home / Self Care | Payer: PPO | Attending: Emergency Medicine | Admitting: Emergency Medicine

## 2017-08-01 ENCOUNTER — Emergency Department: Payer: PPO

## 2017-08-01 DIAGNOSIS — K047 Periapical abscess without sinus: Secondary | ICD-10-CM | POA: Insufficient documentation

## 2017-08-01 DIAGNOSIS — Z79899 Other long term (current) drug therapy: Secondary | ICD-10-CM | POA: Diagnosis not present

## 2017-08-01 DIAGNOSIS — K0889 Other specified disorders of teeth and supporting structures: Secondary | ICD-10-CM | POA: Diagnosis present

## 2017-08-01 DIAGNOSIS — R220 Localized swelling, mass and lump, head: Secondary | ICD-10-CM

## 2017-08-01 DIAGNOSIS — R600 Localized edema: Secondary | ICD-10-CM | POA: Diagnosis not present

## 2017-08-01 DIAGNOSIS — I10 Essential (primary) hypertension: Secondary | ICD-10-CM | POA: Insufficient documentation

## 2017-08-01 DIAGNOSIS — G8929 Other chronic pain: Secondary | ICD-10-CM | POA: Insufficient documentation

## 2017-08-01 HISTORY — DX: Other frontotemporal neurocognitive disorder: G31.09

## 2017-08-01 HISTORY — DX: Motor neuron disease, unspecified: G12.20

## 2017-08-01 HISTORY — DX: Dementia in other diseases classified elsewhere, unspecified severity, without behavioral disturbance, psychotic disturbance, mood disturbance, and anxiety: F02.80

## 2017-08-01 LAB — COMPREHENSIVE METABOLIC PANEL
ALBUMIN: 4.1 g/dL (ref 3.5–5.0)
ALT: 15 U/L — ABNORMAL LOW (ref 17–63)
ANION GAP: 10 (ref 5–15)
AST: 21 U/L (ref 15–41)
Alkaline Phosphatase: 82 U/L (ref 38–126)
BUN: 12 mg/dL (ref 6–20)
CALCIUM: 9.3 mg/dL (ref 8.9–10.3)
CHLORIDE: 102 mmol/L (ref 101–111)
CO2: 26 mmol/L (ref 22–32)
CREATININE: 1.38 mg/dL — AB (ref 0.61–1.24)
GFR calc non Af Amer: 50 mL/min — ABNORMAL LOW (ref >60)
GFR, EST AFRICAN AMERICAN: 57 mL/min — AB (ref >60)
Glucose, Bld: 144 mg/dL — ABNORMAL HIGH (ref 65–99)
POTASSIUM: 3.4 mmol/L — AB (ref 3.5–5.1)
SODIUM: 138 mmol/L (ref 135–145)
TOTAL PROTEIN: 6.9 g/dL (ref 6.5–8.1)
Total Bilirubin: 0.8 mg/dL (ref 0.3–1.2)

## 2017-08-01 LAB — CBC WITH DIFFERENTIAL/PLATELET
BASOS PCT: 1 %
Basophils Absolute: 0.1 10*3/uL (ref 0–0.1)
EOS ABS: 0.1 10*3/uL (ref 0–0.7)
EOS PCT: 1 %
HCT: 42 % (ref 40.0–52.0)
Hemoglobin: 14.5 g/dL (ref 13.0–18.0)
LYMPHS ABS: 1.5 10*3/uL (ref 1.0–3.6)
Lymphocytes Relative: 13 %
MCH: 31.4 pg (ref 26.0–34.0)
MCHC: 34.6 g/dL (ref 32.0–36.0)
MCV: 90.8 fL (ref 80.0–100.0)
MONOS PCT: 12 %
Monocytes Absolute: 1.4 10*3/uL — ABNORMAL HIGH (ref 0.2–1.0)
NEUTROS PCT: 73 %
Neutro Abs: 8.8 10*3/uL — ABNORMAL HIGH (ref 1.4–6.5)
PLATELETS: 300 10*3/uL (ref 150–440)
RBC: 4.62 MIL/uL (ref 4.40–5.90)
RDW: 13.9 % (ref 11.5–14.5)
WBC: 11.9 10*3/uL — AB (ref 3.8–10.6)

## 2017-08-01 MED ORDER — IOPAMIDOL (ISOVUE-300) INJECTION 61%
75.0000 mL | Freq: Once | INTRAVENOUS | Status: AC | PRN
  Administered 2017-08-01: 75 mL via INTRAVENOUS

## 2017-08-01 MED ORDER — CLINDAMYCIN HCL 300 MG PO CAPS: 300.0000 mg | ORAL_CAPSULE | Freq: Three times a day (TID) | ORAL | 0 refills | Status: AC

## 2017-08-01 MED ORDER — CLINDAMYCIN PHOSPHATE 900 MG/50ML IV SOLN
900.0000 mg | Freq: Once | INTRAVENOUS | Status: AC
  Administered 2017-08-01: 900 mg via INTRAVENOUS
  Filled 2017-08-01: qty 50

## 2017-08-01 NOTE — ED Provider Notes (Signed)
[facility] Emergency Department Provider Note   ____________________________________________   First MD Initiated Contact with Patient 08/01/17 2223     (approximate)  I have reviewed the triage vital signs and the nursing notes.   HISTORY  Chief Complaint Dental Pain and Facial Swelling    HPI Vincent Ayala is a 71 y.o. male patient developed swelling of his lower face and chin today. He has a history of dementia and is refusing to see the dentist. He has very bad dentition. He reports that he has no other health problems. His wife agrees except for the dementia. He denies any shortness of breath difficulty swallowing or any other problems and for some pain with the swelling.   Past Medical History:  Diagnosis Date  . 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 Dementia 03/12/2016  . Depression 03/12/2016  . 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 Sleep apnea 03/12/2016  . Chronic pain 03/12/2016  . 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 Hypertension 03/12/2016  . Allergic rhinitis 03/12/2016  . Osteoporosis 03/12/2016  . Polymyalgia rheumatica ([place]) 03/12/2016  . Cervical radiculopathy 03/12/2016    Past Surgical History:  Procedure Laterality Date  . 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 SPINE SURGERY     ruptured disc    Prior to Admission medications   Medication Sig Start Date End Date Taking? Authorizing Provider  calcium-vitamin D 250-100 MG-UNIT tablet Take 1 tablet by mouth 2 (two) times daily.    [provider]  clindamycin (CLEOCIN) 300 MG capsule Take 1 capsule (300 mg total) by mouth 3 (three) times daily. 08/01/17 08/11/17  Nena Polio, MD  lamoTRIgine (LAMICTAL) 100 MG tablet Take 100 mg by mouth 2 (two) times daily.     [provider]  losartan (COZAAR) 50 MG tablet TAKE ONE TABLET EVERY DAY 06/02/17   Jerrol Banana., MD  Melatonin 3 MG TABS Take 6 mg by mouth at bedtime.     [provider]  memantine (NAMENDA) 10 MG tablet Take 10 mg by mouth daily.     [provider]  mirtazapine (REMERON) 15 MG tablet Take 1 tablet (15 mg total) by mouth at bedtime. 12/17/16   Jerrol Banana., MD  Multiple Vitamin (MULTIVITAMIN) tablet Take 1 tablet by mouth daily.    [provider]  sertraline (ZOLOFT) 100 MG tablet TAKE 1 AND 1/2 TABLET EVERY DAY 06/02/17   Jerrol Banana., MD    Allergies Penicillins  History reviewed. No pertinent family history.  Social History Social History  Substance Use Topics  . Smoking status: Never Smoker  . Smokeless tobacco: Never Used  . Alcohol use No    Review of Systems Constitutional: No fever/chills Eyes: No visual changes. ENT: No sore throat. Cardiovascular: Denies chest pain. Respiratory: Denies shortness of breath. Gastrointestinal: No abdominal pain.  No nausea, no vomiting.  No diarrhea.  No constipation. Genitourinary: Negative for dysuria. Musculoskeletal: Negative for back pain. Skin: Negative for rash. Neurological: Negative for headaches, focal weakness   ____________________________________________   PHYSICAL EXAM:  VITAL SIGNS: ED Triage Vitals  Enc Vitals Group     BP 08/01/17 1935 (!) 161/82     Pulse  Rate 08/01/17 1935 98     Resp 08/01/17 1935 18     Temp 08/01/17 1935 99.8 F (37.7 C)     Temp Source 08/01/17 1935 Oral     SpO2 08/01/17 1935 98 %     Weight --      Height 08/01/17 1936 6\' 4"  (1.93 m)     Head Circumference --      Peak Flow --      Pain Score 08/01/17 1935 10     Pain Loc --      Pain Edu? --      Excl. in [place]? --     Constitutional: Alert and oriented. Well appearing and in no acute distress. Eyes: Conjunctivae are normal. . Head: AtraumaticExcept for  redness warmth and swelling around the point of the chin including the whole anterior chin. Nose: No congestion/rhinnorhea. Mouth/Throat: Mucous membranes are moist.  Oropharynx non-erythematous. Neck: No strido . Cardiovascular: Normal rate, regular rhythm.   Good peripheral circulation. Respiratory: Normal respiratory effort.  No retractions.  Gastrointestinal: Soft and nontender. No distention. No abdominal bruits. No CVA tenderness. Musculoskeletal: No lower extremity tenderness nor edema.  No joint effusions. Neurologic:  Normal speech and language. No gross focal neurologic deficits are appreciated. No gait instability. Skin:  Skin is warm, dry and intact. No rash noted. Psychiatric: Mood and affect are normal. Speech and behavior are normal.  ____________________________________________   LABS (all labs ordered are listed, but only abnormal results are displayed)  Labs Reviewed  CBC WITH DIFFERENTIAL/PLATELET - Abnormal; Notable for the following:       Result Value   WBC 11.9 (*)    Neutro Abs 8.8 (*)    Monocytes Absolute 1.4 (*)    All other components within normal limits  COMPREHENSIVE METABOLIC PANEL - Abnormal; Notable for the following:    Potassium 3.4 (*)    Glucose, Bld 144 (*)    Creatinine, Ser 1.38 (*)    ALT 15 (*)    GFR calc non Af Amer 50 (*)    GFR calc Af Amer 57 (*)    All other components within normal limits   ____________________________________________  EKG   ____________________________________________  RADIOLOGY  IMPRESSION: 1. Motion degraded examination. 2. Lower facial cellulitis, likely on odontogenic basis given poor dentition. No drainable fluid collection.   Electronically Signed   By: Elon Alas M.D.   On: 08/02/2017 00:05 ____________________________________________   PROCEDURES  Procedure(s) performed:   Procedures  Critical Care performed:   ____________________________________________   INITIAL  IMPRESSION / ASSESSMENT AND PLAN / ED COURSE  Pertinent labs & imaging results that were available during my care of the patient were reviewed by me and considered in my medical decision making (see chart for details).        ____________________________________________   FINAL CLINICAL IMPRESSION(S) / ED DIAGNOSES  Final diagnoses:  Facial swelling  Dental abscess      NEW MEDICATIONS STARTED DURING THIS VISIT:  New Prescriptions   CLINDAMYCIN (CLEOCIN) 300 MG CAPSULE    Take 1 capsule (300 mg total) by mouth 3 (three) times daily.     Note:  This document was prepared using Dragon voice recognition software and may include unintentional dictation errors.    Nena Polio, MD 08/02/17 Laureen Abrahams

## 2017-08-01 NOTE — Discharge Instructions (Signed)
Please follow-up with one of the dentist in the list I gave you. Call them Monday morning to set up the appointment. Please return here for increasing swelling any trouble swallowing or breathing or if he gets a fever or axis.. If he has any trouble swallowing or breathing [facility] ambulance immediately. Take the clindamycin 1 pill 3 times a day. Sometimes as can cause diarrhea. If he gets bad diarrhea please return here also.

## 2017-08-01 NOTE — ED Triage Notes (Signed)
Wife reports pt has been c/o pain with chewing for several days; for the last 2 days he's begun having lower facial swelling to chin and left jaw line; pt's wife says he has not seen a dentist in 5 years, since he was told he needed teeth pulled but refused; lower jaw with redness, warmth and swelling;

## 2017-08-01 NOTE — ED Notes (Signed)
Presenting symptoms and VS discussed with Dr Corky Downs; verbal orders for bloodwork given at this time

## 2017-08-01 NOTE — ED Notes (Signed)
Patient r/f CT. 

## 2017-08-01 NOTE — ED Notes (Signed)
CT notified patient has IV placed and is demented with patient's wife at bedside trying to keep his attention diverted.  This RN applied Koban to right hand to obscure it.

## 2017-08-10 DIAGNOSIS — B351 Tinea unguium: Secondary | ICD-10-CM | POA: Diagnosis not present

## 2017-08-10 DIAGNOSIS — M79674 Pain in right toe(s): Secondary | ICD-10-CM | POA: Diagnosis not present

## 2017-09-28 DIAGNOSIS — F0280 Dementia in other diseases classified elsewhere without behavioral disturbance: Secondary | ICD-10-CM | POA: Diagnosis not present

## 2017-09-28 DIAGNOSIS — G3109 Other frontotemporal dementia: Secondary | ICD-10-CM | POA: Diagnosis not present

## 2017-09-28 DIAGNOSIS — F39 Unspecified mood [affective] disorder: Secondary | ICD-10-CM | POA: Diagnosis not present

## 2017-09-28 DIAGNOSIS — G479 Sleep disorder, unspecified: Secondary | ICD-10-CM | POA: Diagnosis not present

## 2017-10-07 ENCOUNTER — Ambulatory Visit (INDEPENDENT_AMBULATORY_CARE_PROVIDER_SITE_OTHER): Payer: PPO | Admitting: Family Medicine

## 2017-10-07 VITALS — BP 118/76 | HR 78 | Temp 97.6°F | Resp 14 | Wt 256.0 lb

## 2017-10-07 DIAGNOSIS — R634 Abnormal weight loss: Secondary | ICD-10-CM

## 2017-10-07 DIAGNOSIS — I10 Essential (primary) hypertension: Secondary | ICD-10-CM | POA: Diagnosis not present

## 2017-10-07 DIAGNOSIS — K0510 Chronic gingivitis, plaque induced: Secondary | ICD-10-CM | POA: Diagnosis not present

## 2017-10-07 DIAGNOSIS — G3109 Other frontotemporal dementia: Secondary | ICD-10-CM | POA: Diagnosis not present

## 2017-10-07 DIAGNOSIS — F0280 Dementia in other diseases classified elsewhere without behavioral disturbance: Secondary | ICD-10-CM | POA: Diagnosis not present

## 2017-10-07 DIAGNOSIS — G4733 Obstructive sleep apnea (adult) (pediatric): Secondary | ICD-10-CM

## 2017-10-07 DIAGNOSIS — F3289 Other specified depressive episodes: Secondary | ICD-10-CM

## 2017-10-07 LAB — COMPLETE METABOLIC PANEL WITH GFR
AG RATIO: 1.7 (calc) (ref 1.0–2.5)
ALKALINE PHOSPHATASE (APISO): 76 U/L (ref 40–115)
ALT: 12 U/L (ref 9–46)
AST: 13 U/L (ref 10–35)
Albumin: 4 g/dL (ref 3.6–5.1)
BILIRUBIN TOTAL: 0.5 mg/dL (ref 0.2–1.2)
BUN / CREAT RATIO: 8 (calc) (ref 6–22)
BUN: 11 mg/dL (ref 7–25)
CO2: 29 mmol/L (ref 20–32)
Calcium: 9.4 mg/dL (ref 8.6–10.3)
Chloride: 105 mmol/L (ref 98–110)
Creat: 1.39 mg/dL — ABNORMAL HIGH (ref 0.70–1.18)
GFR, EST AFRICAN AMERICAN: 58 mL/min/{1.73_m2} — AB (ref >=60)
GFR, Est Non African American: 50 mL/min/{1.73_m2} — ABNORMAL LOW (ref >=60)
GLUCOSE: 107 mg/dL — AB (ref 65–99)
Globulin: 2.4 g/dL (calc) (ref 1.9–3.7)
POTASSIUM: 3.9 mmol/L (ref 3.5–5.3)
SODIUM: 141 mmol/L (ref 135–146)
TOTAL PROTEIN: 6.4 g/dL (ref 6.1–8.1)

## 2017-10-07 LAB — CBC WITH DIFFERENTIAL/PLATELET
BASOS ABS: 50 {cells}/uL (ref 0–200)
Basophils Relative: 0.9 %
EOS PCT: 3.2 %
Eosinophils Absolute: 176 cells/uL (ref 15–500)
HCT: 40.2 % (ref 38.5–50.0)
HEMOGLOBIN: 14 g/dL (ref 13.2–17.1)
Lymphs Abs: 1419 cells/uL (ref 850–3900)
MCH: 31.5 pg (ref 27.0–33.0)
MCHC: 34.8 g/dL (ref 32.0–36.0)
MCV: 90.5 fL (ref 80.0–100.0)
MONOS PCT: 8.5 %
MPV: 9.4 fL (ref 7.5–12.5)
NEUTROS ABS: 3388 {cells}/uL (ref 1500–7800)
Neutrophils Relative %: 61.6 %
PLATELETS: 358 10*3/uL (ref 140–400)
RBC: 4.44 10*6/uL (ref 4.20–5.80)
RDW: 13.1 % (ref 11.0–15.0)
TOTAL LYMPHOCYTE: 25.8 %
WBC mixed population: 468 cells/uL (ref 200–950)
WBC: 5.5 10*3/uL (ref 3.8–10.8)

## 2017-10-07 LAB — TSH: TSH: 0.36 m[IU]/L — AB (ref 0.40–4.50)

## 2017-10-07 NOTE — Progress Notes (Signed)
Vincent Ayala  MRN: [id number] DOB: 10-25-1946  Subjective:  HPI  Patient is here for 6 months follow up. Last office visit was on 04/01/17. Hypertension: patient is not checking his b/p. No cardiac symptoms. BP Readings from Last 3 Encounters:  10/07/17 118/76  08/02/17 (!) 160/80  07/09/17 132/78   Wt Readings from Last 3 Encounters:  10/07/17 256 lb (116.1 kg)  07/09/17 274 lb 6.4 oz (124.5 kg)  04/01/17 282 lb (127.9 kg)   Depression: doing well per patient and his wife. Depression screen [facility] 2/9 07/09/2017 10/01/2016 03/12/2016  Decreased Interest 0 0 3  Down, Depressed, Hopeless 2 0 3  PHQ - 2 Score 2 0 6  Altered sleeping 3 - -  Tired, decreased energy 3 - -  Change in appetite 0 - -  Feeling bad or failure about yourself  0 - -  Trouble concentrating 0 - -  Moving slowly or fidgety/restless 0 - -  Suicidal thoughts 0 - -  PHQ-9 Score 8 - -  Difficult doing work/chores Not difficult at all - -   Dementia; patient is following neurologist and last office visit was on 09/28/17. His Trazodone was stopped and patient has been started on Seroquel and has taking this for a week now. This change was made due to patient's bad sleep pattern.  Patient Active Problem List   Diagnosis Date Noted  . Dementia 03/12/2016  . Depression 03/12/2016  . 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 Sleep apnea 03/12/2016  . Chronic pain 03/12/2016  . 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 Hypertension 03/12/2016  . Allergic rhinitis 03/12/2016  . Osteoporosis 03/12/2016  . Polymyalgia rheumatica ([place]) 03/12/2016  . Cervical radiculopathy 03/12/2016    Past Medical History:  Diagnosis Date  . 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 Marital status: Married    Spouse name: N/A  . Number of children: N/A  . Years of education: N/A   Occupational History    . Not on file.   Social History Main Topics  . Smoking status: Never Smoker  . Smokeless tobacco: Never Used  . Alcohol use No  . Drug use: No  . Sexual activity: Not on file   Other Topics Concern  . Not on file   Social History Narrative  . No narrative on file    Outpatient Encounter Prescriptions as of 10/07/2017  Medication Sig  . calcium-vitamin D 250-100 MG-UNIT tablet Take 1 tablet by mouth 2 (two) times daily.  Marland Kitchen FLUZONE HIGH-DOSE 0.5 ML injection   . lamoTRIgine (LAMICTAL) 100 MG tablet Take 100 mg by mouth 2 (two) times daily.  Marland Kitchen losartan (COZAAR) 50 MG tablet TAKE ONE TABLET EVERY DAY  . Melatonin 3 MG TABS Take 6 mg by mouth at bedtime.   . memantine (NAMENDA) 10 MG tablet Take 10 mg by mouth daily.   . mirtazapine (REMERON) 15 MG tablet Take 1 tablet (15 mg total) by mouth at bedtime.  . Multiple Vitamin (MULTIVITAMIN) tablet Take 1 tablet by mouth daily.  . QUEtiapine (SEROQUEL) 25 MG tablet   . sertraline (ZOLOFT) 100 MG tablet TAKE 1 AND 1/2 TABLET EVERY DAY   No facility-administered encounter medications on file as of 10/07/2017.     Allergies  Allergen Reactions  . Penicillins     Review of  Systems  Constitutional: Negative.   Eyes: Negative.   Respiratory: Negative.   Cardiovascular: Negative.   Gastrointestinal: Negative.   Musculoskeletal: Negative.   Skin: Negative.   Neurological: Negative.   Endo/Heme/Allergies: Negative.   Psychiatric/Behavioral: Positive for memory loss. The patient has insomnia.     Objective:  BP 118/76   Pulse 78   Temp 97.6 F (36.4 C)   Resp 14   Wt 256 lb (116.1 kg)   BMI 31.16 kg/m   Physical Exam  Constitutional: He is oriented to person, place, and time and well-developed, well-nourished, and in no distress.  HENT:  Head: Normocephalic and atraumatic.  Eyes: Conjunctivae are normal. No scleral icterus.  Neck: No thyromegaly present.  Cardiovascular: Normal rate, regular rhythm and normal heart  sounds.   Pulmonary/Chest: Effort normal and breath sounds normal.  Abdominal: Soft.  Neurological: He is alert and oriented to person, place, and time. Gait normal. GCS score is 15.  Skin: Skin is warm and dry.  Psychiatric: Mood and affect normal.    Assessment and Plan :  1. Essential hypertension Stable. - CBC with Differential/Platelet - Comprehensive metabolic panel  2. Other depression Stable. - TSH  3. Frontotemporal dementia Following neurologist. - TSH  4. Weight loss Unintentional, probably due to dental work/extraction of teeth.Possibly due to dementia.  5. Gingivitis  HPI, Exam and A&P transcribed by Tiffany Kocher, RMA under direction and in the presence of Miguel Aschoff, MD. I have done the exam and reviewed the chart and it is accurate to the best of my knowledge. Development worker, community has been used and  any errors in dictation or transcription are unintentional. Miguel Aschoff M.D. [facility]

## 2017-10-13 ENCOUNTER — Telehealth: Payer: Self-pay

## 2017-10-13 DIAGNOSIS — R7989 Other specified abnormal findings of blood chemistry: Secondary | ICD-10-CM

## 2017-10-13 DIAGNOSIS — R634 Abnormal weight loss: Secondary | ICD-10-CM

## 2017-10-13 NOTE — Telephone Encounter (Signed)
-----   Message from Jerrol Banana., MD sent at 10/09/2017 10:02 AM EDT ----- With the weight loss he has had would refer to endocrinology for  Possible hyperthyroidism.

## 2017-10-13 NOTE — Telephone Encounter (Signed)
Pt's wife [name] advised. She agrees with treatment plan.

## 2017-10-23 ENCOUNTER — Encounter: Payer: Self-pay | Admitting: Family Medicine

## 2017-10-23 ENCOUNTER — Ambulatory Visit (INDEPENDENT_AMBULATORY_CARE_PROVIDER_SITE_OTHER): Payer: PPO | Admitting: Family Medicine

## 2017-10-23 VITALS — BP 162/88 | HR 66 | Temp 98.4°F | Wt 258.2 lb

## 2017-10-23 DIAGNOSIS — F0280 Dementia in other diseases classified elsewhere without behavioral disturbance: Secondary | ICD-10-CM

## 2017-10-23 DIAGNOSIS — I10 Essential (primary) hypertension: Secondary | ICD-10-CM

## 2017-10-23 DIAGNOSIS — H6123 Impacted cerumen, bilateral: Secondary | ICD-10-CM | POA: Diagnosis not present

## 2017-10-23 DIAGNOSIS — G3109 Other frontotemporal dementia: Secondary | ICD-10-CM

## 2017-10-23 MED ORDER — NEOMYCIN-POLYMYXIN-HC 1 % OT SOLN: 3.0000 [drp] | Freq: Three times a day (TID) | OTIC | 0 refills | Status: DC

## 2017-10-23 NOTE — Patient Instructions (Signed)
Earwax Buildup, Adult The ears produce a substance called earwax that helps keep bacteria out of the ear and protects the skin in the ear canal. Occasionally, earwax can build up in the ear and cause discomfort or hearing loss. What increases the risk? This condition is more likely to develop in people who:  Are male.  Are elderly.  Naturally produce more earwax.  Clean their ears often with cotton swabs.  Use earplugs often.  Use in-ear headphones often.  Wear hearing aids.  Have narrow ear canals.  Have earwax that is overly thick or sticky.  Have eczema.  Are dehydrated.  Have excess hair in the ear canal.  What are the signs or symptoms? Symptoms of this condition include:  Reduced or muffled hearing.  A feeling of fullness in the ear or feeling that the ear is plugged.  Fluid coming from the ear.  Ear pain.  Ear itch.  Ringing in the ear.  Coughing.  An obvious piece of earwax that can be seen inside the ear canal.  How is this diagnosed? This condition may be diagnosed based on:  Your symptoms.  Your medical history.  An ear exam. During the exam, your health care provider will look into your ear with an instrument called an otoscope.  You may have tests, including a hearing test. How is this treated? This condition may be treated by:  Using ear drops to soften the earwax.  Having the earwax removed by a health care provider. The health care provider may: ? Flush the ear with water. ? Use an instrument that has a loop on the end (curette). ? Use a suction device.  Surgery to remove the wax buildup. This may be done in severe cases.  Follow these instructions at home:  Take over-the-counter and prescription medicines only as told by your health care provider.  Do not put any objects, including cotton swabs, into your ear. You can clean the opening of your ear canal with a washcloth or facial tissue.  Follow instructions from your health  care provider about cleaning your ears. Do not over-clean your ears.  Drink enough fluid to keep your urine clear or pale yellow. This will help to thin the earwax.  Keep all follow-up visits as told by your health care provider. If earwax builds up in your ears often or if you use hearing aids, consider seeing your health care provider for routine, preventive ear cleanings. Ask your health care provider how often you should schedule your cleanings.  If you have hearing aids, clean them according to instructions from the manufacturer and your health care provider. Contact a health care provider if:  You have ear pain.  You develop a fever.  You have blood, pus, or other fluid coming from your ear.  You have hearing loss.  You have ringing in your ears that does not go away.  Your symptoms do not improve with treatment.  You feel like the room is spinning (vertigo). Summary  Earwax can build up in the ear and cause discomfort or hearing loss.  The most common symptoms of this condition include reduced or muffled hearing and a feeling of fullness in the ear or feeling that the ear is plugged.  This condition may be diagnosed based on your symptoms, your medical history, and an ear exam.  This condition may be treated by using ear drops to soften the earwax or by having the earwax removed by a health care provider.  Do   not put any objects, including cotton swabs, into your ear. You can clean the opening of your ear canal with a washcloth or facial tissue. This information is not intended to replace advice given to you by your health care provider. Make sure you discuss any questions you have with your health care provider. Document Released: 01/08/2005 Document Revised: 02/11/2017 Document Reviewed: 02/11/2017 Elsevier Interactive Patient Education  2018 [employer]vier Inc.  

## 2017-10-23 NOTE — Progress Notes (Signed)
Patient: Vincent Ayala. [place] Male    DOB: 08-13-1946   71 y.o.   MRN: [id number] Visit Date: 10/23/2017  Today's Provider: Vernie Murders, PA   Chief Complaint  Patient presents with  . Ear Fullness   Subjective:    Ear Fullness   There is pain in both ears. This is a recurrent problem. The problem occurs constantly. The problem has been gradually worsening. There has been no fever. Associated symptoms include hearing loss. He has tried nothing for the symptoms. wax buildup   Past Medical History:  Diagnosis Date  . 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 SPINE SURGERY     ruptured disc   History reviewed. No pertinent family history.  Allergies  Allergen Reactions  . Penicillins     Current Outpatient Medications:  .  calcium-vitamin D 250-100 MG-UNIT tablet, Take 1 tablet by mouth 2 (two) times daily., Disp: , Rfl:  .  lamoTRIgine (LAMICTAL) 100 MG tablet, Take 100 mg by mouth 2 (two) times daily., Disp: , Rfl:  .  LORazepam (ATIVAN) 1 MG tablet, Take 1 mg by mouth every 8 (eight) hours., Disp: , Rfl:  .  losartan (COZAAR) 50 MG tablet, TAKE ONE TABLET EVERY DAY, Disp: 30 tablet, Rfl: 12 .  Melatonin 3 MG TABS, Take 6 mg by mouth at bedtime. , Disp: , Rfl:  .  memantine (NAMENDA) 10 MG tablet, Take 10 mg by mouth daily. , Disp: , Rfl:  .  mirtazapine (REMERON) 15 MG tablet, Take 1 tablet (15 mg total) by mouth at bedtime., Disp: 90 tablet, Rfl: 1 .  Multiple Vitamin (MULTIVITAMIN) tablet, Take 1 tablet by mouth daily., Disp: , Rfl:  .  QUEtiapine (SEROQUEL) 25 MG tablet, , Disp: , Rfl:  .  sertraline (ZOLOFT) 100 MG tablet, TAKE 1 AND 1/2 TABLET EVERY DAY, Disp: 45 tablet, Rfl: 12  Review of Systems  Constitutional: Negative.   HENT: Positive for  hearing loss. Ear pain: ear fullness.   Respiratory: Negative.   Cardiovascular: Negative.    Social History   Tobacco Use  . Smoking status: Never Smoker  . Smokeless tobacco: Never Used  Substance Use Topics  . Alcohol use: No   Objective:   BP (!) 162/88 (BP Location: Right Arm, Patient Position: Sitting, Cuff Size: Normal)   Pulse 66   Temp 98.4 F (36.9 C) (Oral)   Wt 258 lb 3.2 oz (117.1 kg)   SpO2 99%   BMI 31.43 kg/m   BP Readings from Last 3 Encounters:  10/23/17 (!) 162/88  10/07/17 118/76  08/02/17 (!) 160/80    Physical Exam  Constitutional: He appears well-developed and well-nourished.  HENT:  Head: Normocephalic.  In the process of extraction of all teeth at [facility]. Both ears are occluded with cerumen. Diminishes hearing and worsens dementia.  Eyes: Conjunctivae are normal.  Neck: Neck supple.  Cardiovascular: Normal rate and regular rhythm.  Pulmonary/Chest: Effort normal and breath sounds normal.  Abdominal: Soft. Bowel sounds are normal.  Neurological: He is alert.  Disoriented/confused.  Psychiatric:  Co-operative and in good spirits.      Assessment & Plan:     1. Bilateral hearing loss due to  cerumen impaction Hearing diminished per wife's observation. Patient denies discomfort or hearing issues. After irrigating large amount of cerumen out of each ear, both canals still occluded with wax. Will use Cortisporin drops TID and recheck in 3 days for further irrigation. - Ear Lavage - NEOMYCIN-POLYMYXIN-HYDROCORTISONE (CORTISPORIN) 1 % SOLN OTIC solution; Place 3 drops 3 (three) times daily into both ears.  Dispense: 10 mL; Refill: 0  2. Essential hypertension Elevation of BP today with ear irritation bilaterally. Will control sodium and caffeine intake. Recheck BP in 3 days after further ear irrigation.  3. Frontotemporal dementia Co-operative and pleasant today. Confusion and disorientation progressing. Continue follow up with Dr.  Manuella Ghazi (neurologist).       Vernie Murders, PA  [facility]

## 2017-10-26 ENCOUNTER — Encounter: Payer: Self-pay | Admitting: Family Medicine

## 2017-10-26 ENCOUNTER — Ambulatory Visit (INDEPENDENT_AMBULATORY_CARE_PROVIDER_SITE_OTHER): Payer: PPO | Admitting: Family Medicine

## 2017-10-26 VITALS — BP 148/88 | HR 58 | Temp 97.5°F

## 2017-10-26 DIAGNOSIS — H6123 Impacted cerumen, bilateral: Secondary | ICD-10-CM | POA: Diagnosis not present

## 2017-10-26 NOTE — Progress Notes (Signed)
     Patient: Vincent Ayala. [place] Male    DOB: Jun 02, 1946   71 y.o.   MRN: [id number] Visit Date: 10/26/2017  Today's Provider: Vernie Murders, PA   Chief Complaint  Patient presents with  . Ear Irrigation   Subjective:    HPI Bilateral hearing loss due to cerumen impaction: Patient is here for a 3 day follow up. Last OV was on 10/23/2017. Irrigated large amounts of cerumen out of each ear, but both canals were still occluded with wax. Patient advised to start Neomycin-polymyxin-hydrocortisone otic drops TID and follow up in 3 days.     Allergies  Allergen Reactions  . Penicillins      Current Outpatient Medications:  .  calcium-vitamin D 250-100 MG-UNIT tablet, Take 1 tablet by mouth 2 (two) times daily., Disp: , Rfl:  .  lamoTRIgine (LAMICTAL) 100 MG tablet, Take 100 mg by mouth 2 (two) times daily., Disp: , Rfl:  .  LORazepam (ATIVAN) 1 MG tablet, Take 1 mg by mouth every 8 (eight) hours., Disp: , Rfl:  .  losartan (COZAAR) 50 MG tablet, TAKE ONE TABLET EVERY DAY, Disp: 30 tablet, Rfl: 12 .  Melatonin 3 MG TABS, Take 6 mg by mouth at bedtime. , Disp: , Rfl:  .  memantine (NAMENDA) 10 MG tablet, Take 10 mg by mouth daily. , Disp: , Rfl:  .  mirtazapine (REMERON) 15 MG tablet, Take 1 tablet (15 mg total) by mouth at bedtime., Disp: 90 tablet, Rfl: 1 .  Multiple Vitamin (MULTIVITAMIN) tablet, Take 1 tablet by mouth daily., Disp: , Rfl:  .  NEOMYCIN-POLYMYXIN-HYDROCORTISONE (CORTISPORIN) 1 % SOLN OTIC solution, Place 3 drops 3 (three) times daily into both ears., Disp: 10 mL, Rfl: 0 .  QUEtiapine (SEROQUEL) 25 MG tablet, , Disp: , Rfl:  .  sertraline (ZOLOFT) 100 MG tablet, TAKE 1 AND 1/2 TABLET EVERY DAY, Disp: 45 tablet, Rfl: 12  Review of Systems  Constitutional: Negative.   HENT: Positive for hearing loss (cerumen impaction ).   Respiratory: Negative.   Cardiovascular: Negative.     Social History   Tobacco Use  . Smoking status: Never Smoker  . Smokeless tobacco: Never  Used  Substance Use Topics  . Alcohol use: No   Objective:   BP (!) 148/88 (BP Location: Right Arm, Patient Position: Sitting, Cuff Size: Normal)   Pulse (!) 58   Temp (!) 97.5 F (36.4 C) (Oral)   SpO2 99%   Physical Exam  Constitutional: He appears well-developed and well-nourished.  HENT:  Right Ear: External ear normal.  Left Ear: External ear normal.  Both canals occluded with wax.      Assessment & Plan:     1. Bilateral hearing loss due to cerumen impaction Used the Cortisporin Otic drops TID all weekend and after irrigation today, canals are clear. No TM redness and hearing improved. Recheck prn to monitor cerumen build up. - Ear Lavage       Vernie Murders, [facility]

## 2017-12-29 DIAGNOSIS — D485 Neoplasm of uncertain behavior of skin: Secondary | ICD-10-CM | POA: Diagnosis not present

## 2017-12-29 DIAGNOSIS — D225 Melanocytic nevi of trunk: Secondary | ICD-10-CM | POA: Diagnosis not present

## 2017-12-29 DIAGNOSIS — C44319 Basal cell carcinoma of skin of other parts of face: Secondary | ICD-10-CM | POA: Diagnosis not present

## 2017-12-29 DIAGNOSIS — L821 Other seborrheic keratosis: Secondary | ICD-10-CM | POA: Diagnosis not present

## 2017-12-29 DIAGNOSIS — C44212 Basal cell carcinoma of skin of right ear and external auricular canal: Secondary | ICD-10-CM | POA: Diagnosis not present

## 2017-12-29 DIAGNOSIS — Z85828 Personal history of other malignant neoplasm of skin: Secondary | ICD-10-CM | POA: Diagnosis not present

## 2017-12-29 DIAGNOSIS — D2261 Melanocytic nevi of right upper limb, including shoulder: Secondary | ICD-10-CM | POA: Diagnosis not present

## 2017-12-30 ENCOUNTER — Ambulatory Visit (INDEPENDENT_AMBULATORY_CARE_PROVIDER_SITE_OTHER): Payer: PPO | Admitting: Family Medicine

## 2017-12-30 ENCOUNTER — Encounter: Payer: Self-pay | Admitting: Family Medicine

## 2017-12-30 VITALS — BP 132/74 | HR 66 | Temp 98.0°F | Resp 16 | Wt 261.0 lb

## 2017-12-30 DIAGNOSIS — R918 Other nonspecific abnormal finding of lung field: Secondary | ICD-10-CM

## 2017-12-30 DIAGNOSIS — Z711 Person with feared health complaint in whom no diagnosis is made: Secondary | ICD-10-CM

## 2017-12-30 NOTE — Progress Notes (Signed)
Patient: Vincent Ayala. [place] Male    DOB: 05/19/1946   72 y.o.   MRN: [id number] Visit Date: 12/30/2017  Today's Provider: Lavon Paganini, MD   I, Martha Clan, CMA, am acting as scribe for Lavon Paganini, MD.  Chief Complaint  Patient presents with  . Wheezing   Subjective:    Wheezing   This is a new problem. The current episode started today. The problem has been unchanged. Pertinent negatives include no abdominal pain, chest pain, chills, coughing, ear pain, fever, headaches, hemoptysis, neck pain, rhinorrhea, shortness of breath, sore throat, sputum production, swollen glands or vomiting.  Pt went to [facility] for complete teeth extraction. Per wife, the resident who was going to perform the surgery "put a microphone" on the pt's chest and heard wheezing. The wheezing prohibited the oral surgery from being performed.   Microphone was placed on top of clothes on upper chest.     Allergies  Allergen Reactions  . Penicillins      Current Outpatient Medications:  .  calcium-vitamin D 250-100 MG-UNIT tablet, Take 1 tablet by mouth 2 (two) times daily., Disp: , Rfl:  .  lamoTRIgine (LAMICTAL) 100 MG tablet, Take 100 mg by mouth 2 (two) times daily., Disp: , Rfl:  .  losartan (COZAAR) 50 MG tablet, TAKE ONE TABLET EVERY DAY, Disp: 30 tablet, Rfl: 12 .  Melatonin 3 MG TABS, Take 6 mg by mouth at bedtime. , Disp: , Rfl:  .  memantine (NAMENDA) 10 MG tablet, Take 10 mg by mouth daily. , Disp: , Rfl:  .  mirtazapine (REMERON) 15 MG tablet, Take 1 tablet (15 mg total) by mouth at bedtime., Disp: 90 tablet, Rfl: 1 .  Multiple Vitamin (MULTIVITAMIN) tablet, Take 1 tablet by mouth daily., Disp: , Rfl:  .  QUEtiapine (SEROQUEL) 25 MG tablet, , Disp: , Rfl:  .  sertraline (ZOLOFT) 100 MG tablet, TAKE 1 AND 1/2 TABLET EVERY DAY, Disp: 45 tablet, Rfl: 12 .  traZODone (DESYREL) 50 MG tablet, Take 50 mg by mouth at bedtime., Disp: , Rfl:   Review of Systems  Constitutional:  Negative for chills and fever.  HENT: Negative for ear pain, rhinorrhea and sore throat.   Respiratory: Positive for wheezing. Negative for cough, hemoptysis, sputum production and shortness of breath.   Cardiovascular: Negative for chest pain.  Gastrointestinal: Negative for abdominal pain and vomiting.  Musculoskeletal: Negative for neck pain.  Neurological: Negative for headaches.    Social History   Tobacco Use  . Smoking status: Never Smoker  . Smokeless tobacco: Never Used  Substance Use Topics  . Alcohol use: No   Objective:   BP 132/74 (BP Location: Left Arm, Patient Position: Sitting, Cuff Size: Large)   Pulse 66   Temp 98 F (36.7 C) (Oral)   Resp 16   Wt 261 lb (118.4 kg)   SpO2 97%   BMI 31.77 kg/m  Vitals:   12/30/17 1454  BP: 132/74  Pulse: 66  Resp: 16  Temp: 98 F (36.7 C)  TempSrc: Oral  SpO2: 97%  Weight: 261 lb (118.4 kg)     Physical Exam  Constitutional: He appears well-developed and well-nourished. No distress.  HENT:  Head: Normocephalic and atraumatic.  Right Ear: External ear normal.  Left Ear: External ear normal.  Nose: Nose normal.  Mouth/Throat: Oropharynx is clear and moist. No oropharyngeal exudate.  Eyes: Conjunctivae are normal. Pupils are equal, round, and reactive to light. No  scleral icterus.  Neck: Neck supple.  Cardiovascular: Normal rate, regular rhythm, normal heart sounds and intact distal pulses.  No murmur heard. Pulmonary/Chest: Effort normal and breath sounds normal. No respiratory distress. He has no wheezes. He has no rales.  Can hear noise when listening over buttons on front of shirt where microphone had been placed  Musculoskeletal: He exhibits no edema.  Lymphadenopathy:    He has no cervical adenopathy.  Neurological: He is alert.  Psychiatric: He has a normal mood and affect. His behavior is normal.  Vitals reviewed.       Assessment & Plan:     1. Lung field abnormal finding on examination 2.  Worried well - no abnormalities on exam today and no symptoms - let wive listen to lungs as well and clothes noise reproduced noise heard at dentist office - reassurance given - feel that patient is well enough to tolerate sedation for teeth extractions - discussed increased risk of delirium with dementia      Return if symptoms worsen or fail to improve.   The entirety of the information documented in the History of Present Illness, Review of Systems and Physical Exam were personally obtained by me. Portions of this information were initially documented by Raquel Sarna Ratchford, CMA and reviewed by me for thoroughness and accuracy.    Virginia Crews, MD, MPH [facility] 12/30/2017 3:31 PM

## 2017-12-30 NOTE — Patient Instructions (Signed)
Patient is well and lungs are completely clear today.  Should be fine to have teeth extracted.

## 2018-01-25 DIAGNOSIS — R634 Abnormal weight loss: Secondary | ICD-10-CM | POA: Diagnosis not present

## 2018-01-25 DIAGNOSIS — R7989 Other specified abnormal findings of blood chemistry: Secondary | ICD-10-CM | POA: Diagnosis not present

## 2018-01-28 ENCOUNTER — Telehealth: Payer: Self-pay

## 2018-01-28 NOTE — Telephone Encounter (Signed)
LMTCB to change appointment due to new template.

## 2018-02-15 DIAGNOSIS — L905 Scar conditions and fibrosis of skin: Secondary | ICD-10-CM | POA: Diagnosis not present

## 2018-02-15 DIAGNOSIS — C44319 Basal cell carcinoma of skin of other parts of face: Secondary | ICD-10-CM | POA: Diagnosis not present

## 2018-03-29 DIAGNOSIS — F0280 Dementia in other diseases classified elsewhere without behavioral disturbance: Secondary | ICD-10-CM | POA: Diagnosis not present

## 2018-03-29 DIAGNOSIS — G3109 Other frontotemporal dementia: Secondary | ICD-10-CM | POA: Diagnosis not present

## 2018-03-29 DIAGNOSIS — F39 Unspecified mood [affective] disorder: Secondary | ICD-10-CM | POA: Diagnosis not present

## 2018-04-07 ENCOUNTER — Encounter: Payer: Self-pay | Admitting: Family Medicine

## 2018-04-07 ENCOUNTER — Ambulatory Visit (INDEPENDENT_AMBULATORY_CARE_PROVIDER_SITE_OTHER): Payer: PPO | Admitting: Family Medicine

## 2018-04-07 VITALS — BP 110/68 | HR 72 | Temp 97.9°F | Resp 16 | Ht 76.0 in | Wt 256.0 lb

## 2018-04-07 DIAGNOSIS — G4733 Obstructive sleep apnea (adult) (pediatric): Secondary | ICD-10-CM

## 2018-04-07 DIAGNOSIS — Z6831 Body mass index (BMI) 31.0-31.9, adult: Secondary | ICD-10-CM | POA: Diagnosis not present

## 2018-04-07 DIAGNOSIS — I10 Essential (primary) hypertension: Secondary | ICD-10-CM

## 2018-04-07 DIAGNOSIS — G3109 Other frontotemporal dementia: Secondary | ICD-10-CM | POA: Diagnosis not present

## 2018-04-07 DIAGNOSIS — F0280 Dementia in other diseases classified elsewhere without behavioral disturbance: Secondary | ICD-10-CM | POA: Diagnosis not present

## 2018-04-07 DIAGNOSIS — M353 Polymyalgia rheumatica: Secondary | ICD-10-CM | POA: Diagnosis not present

## 2018-04-07 NOTE — Progress Notes (Signed)
Patient: Vincent Ayala. [place] Male    DOB: Nov 05, 1946   72 y.o.   MRN: [id number] Visit Date: 04/07/2018  Today's Provider: Wilhemena Durie, MD   Chief Complaint  Patient presents with  . Follow-up   Subjective:    HPI   Hypertension, follow-up:  BP Readings from Last 3 Encounters:  04/07/18 110/68  12/30/17 132/74  10/26/17 (!) 148/88    He was last seen for hypertension 6 months ago. Management since that visit includes; no changes.He reports good compliance with treatment. He is not having side effects. none He n/a exercising. He is adherent to low salt diet.   Outside blood pressures are not checking. He is experiencing none.  Patient denies none.   Cardiovascular risk factors include advanced age (older than 27 for men, 61 for women).  Use of agents associated with hypertension: none ----------------------------------------------------------------  Other depression From 10/07/2017-labs, checked, no changes. Stable.   Frontotemporal dementia From 10/07/2017-labs checked, no changes. Following neurologist.    Allergies  Allergen Reactions  . Penicillins      Current Outpatient Medications:  .  calcium-vitamin D 250-100 MG-UNIT tablet, Take 1 tablet by mouth 2 (two) times daily., Disp: , Rfl:  .  lamoTRIgine (LAMICTAL) 100 MG tablet, Take 100 mg by mouth 2 (two) times daily., Disp: , Rfl:  .  losartan (COZAAR) 50 MG tablet, TAKE ONE TABLET EVERY DAY, Disp: 30 tablet, Rfl: 12 .  Melatonin 3 MG TABS, Take 6 mg by mouth at bedtime. , Disp: , Rfl:  .  memantine (NAMENDA) 10 MG tablet, Take 10 mg by mouth daily. , Disp: , Rfl:  .  mirtazapine (REMERON) 15 MG tablet, Take 1 tablet (15 mg total) by mouth at bedtime., Disp: 90 tablet, Rfl: 1 .  Multiple Vitamin (MULTIVITAMIN) tablet, Take 1 tablet by mouth daily., Disp: , Rfl:  .  QUEtiapine (SEROQUEL) 25 MG tablet, , Disp: , Rfl:  .  sertraline (ZOLOFT) 100 MG tablet, TAKE 1 AND 1/2 TABLET EVERY DAY, Disp:  45 tablet, Rfl: 12 .  traZODone (DESYREL) 50 MG tablet, Take 50 mg by mouth at bedtime., Disp: , Rfl:   Review of Systems  Constitutional: Negative for appetite change, chills and fever.  HENT: Negative.   Eyes: Negative.   Respiratory: Negative for chest tightness, shortness of breath and wheezing.   Cardiovascular: Negative for chest pain and palpitations.  Gastrointestinal: Negative for abdominal pain, nausea and vomiting.  Endocrine: Negative.   Allergic/Immunologic: Negative.   Neurological: Negative.   Psychiatric/Behavioral: Negative.     Social History   Tobacco Use  . Smoking status: Never Smoker  . Smokeless tobacco: Never Used  Substance Use Topics  . Alcohol use: No   Objective:   BP 110/68 (BP Location: Right Arm, Patient Position: Sitting, Cuff Size: Large)   Pulse 72   Temp 97.9 F (36.6 C) (Oral)   Resp 16   Ht 6\' 4"  (1.93 m)   Wt 256 lb (116.1 kg)   SpO2 98%   BMI 31.16 kg/m  Vitals:   04/07/18 1356  BP: 110/68  Pulse: 72  Resp: 16  Temp: 97.9 F (36.6 C)  TempSrc: Oral  SpO2: 98%  Weight: 256 lb (116.1 kg)  Height: 6\' 4"  (1.93 m)     Physical Exam  Constitutional: He is oriented to person, place, and time. He appears well-developed and well-nourished.  HENT:  Head: Normocephalic and atraumatic.  Poor dentition.  Eyes: No scleral  icterus.  Neck: No thyromegaly present.  Cardiovascular: Normal rate, regular rhythm and normal heart sounds.  Pulmonary/Chest: Effort normal and breath sounds normal.  Abdominal: Soft.  Musculoskeletal: He exhibits no edema.  Lymphadenopathy:    He has no cervical adenopathy.  Neurological: He is alert and oriented to person, place, and time.  Skin: Skin is dry.  Psychiatric: He has a normal mood and affect. His behavior is normal. Judgment and thought content normal.        Assessment & Plan:     1. BMI 31.0-31.9,adult 2.Progressive Frontotemporal Dementia Discussed at length with pt/wife.More than  55minutes spent--more than 50% in counseling. 3.CPE Later this year. Discussed at length regarding screening medical issues.,ie colonoscopy. 4.HTN     I have done the exam and reviewed the above chart and it is accurate to the best of my knowledge. Development worker, community has been used in this note in any air is in the dictation or transcription are unintentional.  Wilhemena Durie, MD  [place]

## 2018-04-07 NOTE — Patient Instructions (Addendum)
1. Essential hypertension Stable  2. Other depression Stable   3. Frontotemporal dementia Following neurologist.  Return in July for CPE.

## 2018-04-08 ENCOUNTER — Other Ambulatory Visit: Payer: Self-pay | Admitting: Family Medicine

## 2018-04-08 MED ORDER — LOSARTAN POTASSIUM 50 MG PO TABS: 50.0000 mg | ORAL_TABLET | Freq: Every day | ORAL | 12 refills | Status: DC

## 2018-04-08 NOTE — Telephone Encounter (Signed)
Pt needs refill on his losartan  He uses [facility]  Thanks teri

## 2018-04-08 NOTE — Telephone Encounter (Signed)
Rx sent to pharmacy   

## 2018-04-19 DIAGNOSIS — L905 Scar conditions and fibrosis of skin: Secondary | ICD-10-CM | POA: Diagnosis not present

## 2018-04-19 DIAGNOSIS — C44319 Basal cell carcinoma of skin of other parts of face: Secondary | ICD-10-CM | POA: Diagnosis not present

## 2018-06-10 ENCOUNTER — Other Ambulatory Visit: Payer: Self-pay | Admitting: Family Medicine

## 2018-07-12 ENCOUNTER — Ambulatory Visit (INDEPENDENT_AMBULATORY_CARE_PROVIDER_SITE_OTHER): Payer: PPO | Admitting: Family Medicine

## 2018-07-12 ENCOUNTER — Ambulatory Visit (INDEPENDENT_AMBULATORY_CARE_PROVIDER_SITE_OTHER): Payer: PPO

## 2018-07-12 VITALS — BP 138/72 | HR 70 | Temp 98.0°F | Ht 76.0 in | Wt 274.4 lb

## 2018-07-12 DIAGNOSIS — I10 Essential (primary) hypertension: Secondary | ICD-10-CM

## 2018-07-12 DIAGNOSIS — R7989 Other specified abnormal findings of blood chemistry: Secondary | ICD-10-CM | POA: Diagnosis not present

## 2018-07-12 DIAGNOSIS — F3289 Other specified depressive episodes: Secondary | ICD-10-CM

## 2018-07-12 DIAGNOSIS — F0280 Dementia in other diseases classified elsewhere without behavioral disturbance: Secondary | ICD-10-CM | POA: Diagnosis not present

## 2018-07-12 DIAGNOSIS — Z0000 Encounter for general adult medical examination without abnormal findings: Secondary | ICD-10-CM

## 2018-07-12 DIAGNOSIS — G3109 Other frontotemporal dementia: Secondary | ICD-10-CM

## 2018-07-12 NOTE — Progress Notes (Signed)
Subjective:   Vincent Ayala is a 72 y.o. male who presents for Medicare Annual/Subsequent preventive examination.  Review of Systems:  N/A  Cardiac Risk Factors include: advanced age (>98men, >46 women);male gender;hypertension;obesity (BMI >30kg/m2)     Objective:    Vitals: BP 138/72 (BP Location: Right Arm)   Pulse 70   Temp 98 F (36.7 C) (Oral)   Ht 6\' 4"  (1.93 m)   Wt 274 lb 6.4 oz (124.5 kg)   BMI 33.40 kg/m   Body mass index is 33.4 kg/m.  Advanced Directives 07/12/2018 08/01/2017 07/09/2017  Does Patient Have a Medical Advance Directive? Yes Yes Yes  Type of Advance Directive Living will;Healthcare Power of [place];Living will  Does patient want to make changes to medical advance directive? - Yes (Inpatient - patient defers changing a medical advance directive at this time) -  Copy of [place] in Chart? No - copy requested No - copy requested No - copy requested    Tobacco Social History   Tobacco Use  Smoking Status Never Smoker  Smokeless Tobacco Never Used     Counseling given: Not Answered   Clinical Intake:  Pre-visit preparation completed: Yes  Pain : No/denies pain Pain Score: 0-No pain     Nutritional Status: BMI > 30  Obese Nutritional Risks: None Diabetes: No  How often do you need to have someone help you when you read instructions, pamphlets, or other written materials from your doctor or pharmacy?: 1 - Never  Interpreter Needed?: No  Information entered by :: [facility], LPN  Past Medical History:  Diagnosis Date  . 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 Depression   . 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 Hypertension    Past Surgical History:  Procedure Laterality Date  . 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 SPINE SURGERY     ruptured disc    History reviewed. No pertinent family history. Social History   Socioeconomic History  . Marital status: Married    Spouse name: Not on file  . Number of children: 2  . Years of education: Not on file  . Highest education level: Some college, no degree  Occupational History  . Occupation: retired  Scientific laboratory technician  . Financial resource strain: Not hard at all  . Food insecurity:    Worry: Never true    Inability: Never true  . Transportation needs:    Medical: No    Non-medical: No  Tobacco Use  . Smoking status: Never Smoker  . Smokeless tobacco: Never Used  Substance and Sexual Activity  . Alcohol use: No  . Drug use: No  . Sexual activity: Not on file  Lifestyle  . Physical activity:    Days per week: Not on file    Minutes per session: Not on file  . Stress: Not on file  Relationships  . Social connections:    Talks on phone: Not on file    Gets together: Not on file    Attends religious service: Not on file    Active member of club or organization: Not on file    Attends meetings of clubs or organizations: Not on file    Relationship status: Not on file  Other Topics Concern  . Not on file  Social  History Narrative  . Not on file    Outpatient Encounter Medications as of 07/12/2018  Medication Sig  . calcium-vitamin D 250-100 MG-UNIT tablet Take 1 tablet by mouth 2 (two) times daily.  Marland Kitchen lamoTRIgine (LAMICTAL) 100 MG tablet Take 100 mg by mouth 2 (two) times daily.  Marland Kitchen losartan (COZAAR) 50 MG tablet Take 1 tablet (50 mg total) by mouth daily.  . Melatonin 3 MG TABS Take 6 mg by mouth at bedtime.   . memantine (NAMENDA) 10 MG tablet Take 10 mg by mouth daily.   . mirtazapine (REMERON) 15 MG tablet Take 1 tablet (15 mg total) by mouth at bedtime.  . Multiple Vitamin (MULTIVITAMIN) tablet Take 1 tablet by mouth daily.  . QUEtiapine (SEROQUEL) 25 MG tablet Take 25 mg by mouth 2 (two) times daily.   . sertraline (ZOLOFT) 100 MG tablet TAKE 1 AND 1/2 TABLET EVERY  DAY  . traZODone (DESYREL) 50 MG tablet Take 50 mg by mouth at bedtime.   No facility-administered encounter medications on file as of 07/12/2018.     Activities of Daily Living In your present state of health, do you have any difficulty performing the following activities: 07/12/2018  Hearing? N  Vision? N  Difficulty concentrating or making decisions? Y  Comment On Namenda 10 mg  Walking or climbing stairs? N  Dressing or bathing? N  Doing errands, shopping? N  Preparing Food and eating ? N  Using the Toilet? N  In the past six months, have you accidently leaked urine? N  Do you have problems with loss of bowel control? N  Managing your Medications? Y  Comment Wife managaes meds.  Managing your Finances? Y  Comment Wife manages finances.  Housekeeping or managing your Housekeeping? N  Some recent data might be hidden    Patient Care Team: Jerrol Banana., MD as PCP - General (Family Medicine) Vladimir Crofts, MD as Consulting Physician (Neurology) Oneta Rack, MD as Consulting Physician (Dermatology)   Assessment:   This is a routine wellness examination for Vincent Ayala.  Exercise Activities and Dietary recommendations Current Exercise Habits: The patient does not participate in regular exercise at present, Exercise limited by: None identified  Goals    . DIET - INCREASE WATER INTAKE     Recommend increasing water intake to 4 glasses a day.        Fall Risk Fall Risk  07/12/2018 07/09/2017 10/01/2016 03/12/2016  Falls in the past year? No No No No   Is the patient's home free of loose throw rugs in walkways, pet beds, electrical cords, etc?   yes      Grab bars in the bathroom? no      Handrails on the stairs?   yes      Adequate lighting?   yes  Timed Get Up and Go Performed: N/A  Depression Screen PHQ 2/9 Scores 07/12/2018 07/09/2017 10/01/2016 03/12/2016  PHQ - 2 Score 1 2 0 6  PHQ- 9 Score - 8 - -    Cognitive Function: Pt declined screening today  due to pt having Dementia.         Immunization History  Administered Date(s) Administered  . Influenza, High Dose Seasonal PF 10/01/2016, 10/05/2017  . Pneumococcal Conjugate-13 04/01/2017  . Pneumococcal Polysaccharide-23 12/30/2011  . Td 07/20/1995    Qualifies for Shingles Vaccine? Due for Shingles vaccine. Declined my offer to administer today. Education has been provided regarding the importance of this vaccine. Pt has been  advised to call her insurance company to determine her out of pocket expense. Advised she may also receive this vaccine at her local pharmacy or Health Dept. Verbalized acceptance and understanding.  Screening Tests Health Maintenance  Topic Date Due  . Hepatitis C Screening  [birth year]  . COLONOSCOPY  12/15/2026 (Originally 07/05/1995)  . TETANUS/TDAP  12/15/2026 (Originally 07/19/2005)  . INFLUENZA VACCINE  07/15/2018  . PNA vac Low Risk Adult  Completed   Cancer Screenings: Lung: Low Dose CT Chest recommended if Age 52-80 years, 30 pack-year currently smoking OR have quit w/in 15years. Patient does not qualify. Colorectal: Pt declines referral today.   Additional Screenings:  Hepatitis C Screening: Pt declines today.       Plan:  I have personally reviewed and addressed the Medicare Annual Wellness questionnaire and have noted the following in the patient's chart:  A. Medical and social history B. Use of alcohol, tobacco or illicit drugs  C. Current medications and supplements D. Functional ability and status E.  Nutritional status F.  Physical activity G. Advance directives H. List of other physicians I.  Hospitalizations, surgeries, and ER visits in previous 12 months J.  [place] such as hearing and vision if needed, cognitive and depression L. Referrals and appointments - none  In addition, I have reviewed and discussed with patient certain preventive protocols, quality metrics, and best practice recommendations. A written  personalized care plan for preventive services as well as general preventive health recommendations were provided to patient.  See attached scanned questionnaire for additional information.   Signed,  Fabio Neighbors, LPN Nurse Health Advisor   Nurse Recommendations: Pt declined the colonoscopy referral, tetanus vaccine and Hep C lab order today.

## 2018-07-12 NOTE — Progress Notes (Signed)
Patient: Vincent Ayala. [place] Male    DOB: [birth year]   72 y.o.   MRN: [id number] Visit Date: 07/12/2018  Today's Provider: Wilhemena Durie, MD   Chief Complaint  Patient presents with  . Hypertension  . Dementia  . Depression   Subjective:    HPI  Patient saw [facility]enzie for AWV today.   Hypertension, follow-up:  BP Readings from Last 3 Encounters:  07/12/18 138/72  04/07/18 110/68  12/30/17 132/74    He was last seen for hypertension 3 months ago.  BP at that visit was 110/68. Management since that visit includes no changes. He reports good compliance with treatment. He is not having side effects.  He is not exercising. He is adherent to low salt diet.   Outside blood pressures are not being checked. He is experiencing none.  Patient denies exertional chest pressure/discomfort, lower extremity edema and palpitations.   Cardiovascular risk factors include obesity (BMI >= 30 kg/m2).   Weight trend: stable Wt Readings from Last 3 Encounters:  07/12/18 274 lb 6.4 oz (124.5 kg)  04/07/18 256 lb (116.1 kg)  12/30/17 261 lb (118.4 kg)    Current diet: well balanced   Dementia, follow up: Patient was last seen in the office 3 months ago. No changes were made in his medications.  Cognitive Function: Pt declined screening today due to pt having Dementia.   Depression screen [facility] 2/9 07/12/2018 07/09/2017 10/01/2016  Decreased Interest 0 0 0  Down, Depressed, Hopeless 1 2 0  PHQ - 2 Score 1 2 0  Altered sleeping - 3 -  Tired, decreased energy - 3 -  Change in appetite - 0 -  Feeling bad or failure about yourself  - 0 -  Trouble concentrating - 0 -  Moving slowly or fidgety/restless - 0 -  Suicidal thoughts - 0 -  PHQ-9 Score - 8 -  Difficult doing work/chores - Not difficult at all -   Fall Risk  07/12/2018 07/09/2017 10/01/2016 03/12/2016  Falls in the past year? No No No No      Activities of Daily Living In your present state of health, do you have any  difficulty performing the following activities: 07/12/2018  Hearing? N  Vision? N  Difficulty concentrating or making decisions? Y  Comment On Namenda 10 mg  Walking or climbing stairs? N  Dressing or bathing? N  Doing errands, shopping? N  Preparing Food and eating ? N  Using the Toilet? N  In the past six months, have you accidently leaked urine? N  Do you have problems with loss of bowel control? N  Managing your Medications? Y  Comment Wife managaes meds.  Managing your Finances? Y  Comment Wife manages finances.  Housekeeping or managing your Housekeeping? N    Allergies  Allergen Reactions  . Penicillins      Current Outpatient Medications:  .  calcium-vitamin D 250-100 MG-UNIT tablet, Take 1 tablet by mouth 2 (two) times daily., Disp: , Rfl:  .  lamoTRIgine (LAMICTAL) 100 MG tablet, Take 100 mg by mouth 2 (two) times daily., Disp: , Rfl:  .  losartan (COZAAR) 50 MG tablet, Take 1 tablet (50 mg total) by mouth daily., Disp: 30 tablet, Rfl: 12 .  Melatonin 3 MG TABS, Take 6 mg by mouth at bedtime. , Disp: , Rfl:  .  memantine (NAMENDA) 10 MG tablet, Take 10 mg by mouth daily. , Disp: , Rfl:  .  mirtazapine (REMERON)  15 MG tablet, Take 1 tablet (15 mg total) by mouth at bedtime., Disp: 90 tablet, Rfl: 1 .  Multiple Vitamin (MULTIVITAMIN) tablet, Take 1 tablet by mouth daily., Disp: , Rfl:  .  QUEtiapine (SEROQUEL) 25 MG tablet, Take 25 mg by mouth 2 (two) times daily. , Disp: , Rfl:  .  sertraline (ZOLOFT) 100 MG tablet, TAKE 1 AND 1/2 TABLET EVERY DAY, Disp: 45 tablet, Rfl: 12 .  traZODone (DESYREL) 50 MG tablet, Take 50 mg by mouth at bedtime., Disp: , Rfl:   Review of Systems  Constitutional: Negative.   Respiratory: Negative.   Cardiovascular: Negative.   Endocrine: Negative.   Musculoskeletal: Negative.   Neurological: Negative.   Psychiatric/Behavioral: Negative.     Social History   Tobacco Use  . Smoking status: Never Smoker  . Smokeless tobacco: Never  Used  Substance Use Topics  . Alcohol use: No   Objective:  BP 138/72 (BP Location: Right Arm)   Pulse 70   Temp 98 F (36.7 C) (Oral)   Ht 6\' 4"  (1.93 m)   Wt 274 lb 6.4 oz (124.5 kg)   BMI 33.40 kg/m   Body mass index is 33.4 kg/m   Physical Exam  Constitutional: He appears well-developed and well-nourished.  HENT:  Head: Normocephalic and atraumatic.  Right Ear: External ear normal.  Left Ear: External ear normal.  Nose: Nose normal.  Mouth/Throat: Oropharynx is clear and moist.  Most teeth have been pulled.  Eyes: Conjunctivae are normal. Left eye exhibits no discharge.  Neck: No thyromegaly present.  Cardiovascular: Normal rate, regular rhythm and normal heart sounds.  Pulmonary/Chest: Effort normal and breath sounds normal.  Abdominal: Soft.  Musculoskeletal: He exhibits no edema.  Neurological: He is alert.  Skin: Skin is warm and dry.  Psychiatric: He has a normal mood and affect. His behavior is normal.        Assessment & Plan:    1. Essential hypertension  - Comprehensive metabolic panel - Lipid panel  2. Frontotemporal dementia Slowly progressive.  3. Abnormal TSH  - TSH  4. Other depression Stable. - CBC with Differential/Platelet  I have done the exam and reviewed the above chart and it is accurate to the best of my knowledge. Development worker, community has been used in this note in any air is in the dictation or transcription are unintentional.       Wilhemena Durie, MD  [place]

## 2018-07-12 NOTE — Patient Instructions (Signed)
Mr. Vincent Ayala , Thank you for taking time to come for your Medicare Wellness Visit. I appreciate your ongoing commitment to your health goals. Please review the following plan we discussed and let me know if I can assist you in the future.   Screening recommendations/referrals: Colonoscopy: Pt declines referral today.  Recommended yearly ophthalmology/optometry visit for glaucoma screening and checkup Recommended yearly dental visit for hygiene and checkup  Vaccinations: Influenza vaccine: Up to date Pneumococcal vaccine: Up to date Tdap vaccine: Pt declines today.  Shingles vaccine: Pt declines today.     Advanced directives: Please bring a copy of your POA (Power of Attorney) and/or Living Will to your next appointment.   Conditions/risks identified: Obesity; Recommend increasing water intake to 4 glasses a day.   Next appointment: 10:10 AM with Dr Rosanna Randy  Preventive Care 73 Years and Older, Male Preventive care refers to lifestyle choices and visits with your health care provider that can promote health and wellness. What does preventive care include?  A yearly physical exam. This is also called an annual well check.  Dental exams once or twice a year.  Routine eye exams. Ask your health care provider how often you should have your eyes checked.  Personal lifestyle choices, including:  Daily care of your teeth and gums.  Regular physical activity.  Eating a healthy diet.  Avoiding tobacco and drug use.  Limiting alcohol use.  Practicing safe sex.  Taking low doses of aspirin every day.  Taking vitamin and mineral supplements as recommended by your health care provider. What happens during an annual well check? The services and screenings done by your health care provider during your annual well check will depend on your age, overall health, lifestyle risk factors, and family history of disease. Counseling  Your health care provider may ask you questions about  your:  Alcohol use.  Tobacco use.  Drug use.  Emotional well-being.  Home and relationship well-being.  Sexual activity.  Eating habits.  History of falls.  Memory and ability to understand (cognition).  Work and work Statistician. Screening  You may have the following tests or measurements:  Height, weight, and BMI.  Blood pressure.  Lipid and cholesterol levels. These may be checked every 5 years, or more frequently if you are over 65 years old.  Skin check.  Lung cancer screening. You may have this screening every year starting at age 67 if you have a 30-pack-year history of smoking and currently smoke or have quit within the past 15 years.  Fecal occult blood test (FOBT) of the stool. You may have this test every year starting at age 78.  Flexible sigmoidoscopy or colonoscopy. You may have a sigmoidoscopy every 5 years or a colonoscopy every 10 years starting at age 31.  Prostate cancer screening. Recommendations will vary depending on your family history and other risks.  Hepatitis C blood test.  Hepatitis B blood test.  Sexually transmitted disease (STD) testing.  Diabetes screening. This is done by checking your blood sugar (glucose) after you have not eaten for a while (fasting). You may have this done every 1-3 years.  Abdominal aortic aneurysm (AAA) screening. You may need this if you are a current or former smoker.  Osteoporosis. You may be screened starting at age 5 if you are at high risk. Talk with your health care provider about your test results, treatment options, and if necessary, the need for more tests. Vaccines  Your health care provider may recommend certain vaccines, such  as:  Influenza vaccine. This is recommended every year.  Tetanus, diphtheria, and acellular pertussis (Tdap, Td) vaccine. You may need a Td booster every 10 years.  Zoster vaccine. You may need this after age 58.  Pneumococcal 13-valent conjugate (PCV13) vaccine.  One dose is recommended after age 48.  Pneumococcal polysaccharide (PPSV23) vaccine. One dose is recommended after age 52. Talk to your health care provider about which screenings and vaccines you need and how often you need them. This information is not intended to replace advice given to you by your health care provider. Make sure you discuss any questions you have with your health care provider. Document Released: 12/28/2015 Document Revised: 08/20/2016 Document Reviewed: 10/02/2015 Elsevier Interactive Patient Education  2017 [place] Prevention in the Home Falls can cause injuries. They can happen to people of all ages. There are many things you can do to make your home safe and to help prevent falls. What can I do on the outside of my home?  Regularly fix the edges of walkways and driveways and fix any cracks.  Remove anything that might make you trip as you walk through a door, such as a raised step or threshold.  Trim any bushes or trees on the path to your home.  Use bright outdoor lighting.  Clear any walking paths of anything that might make someone trip, such as rocks or tools.  Regularly check to see if handrails are loose or broken. Make sure that both sides of any steps have handrails.  Any raised decks and porches should have guardrails on the edges.  Have any leaves, snow, or ice cleared regularly.  Use sand or salt on walking paths during winter.  Clean up any spills in your garage right away. This includes oil or grease spills. What can I do in the bathroom?  Use night lights.  Install grab bars by the toilet and in the tub and shower. Do not use towel bars as grab bars.  Use non-skid mats or decals in the tub or shower.  If you need to sit down in the shower, use a plastic, non-slip stool.  Keep the floor dry. Clean up any water that spills on the floor as soon as it happens.  Remove soap buildup in the tub or shower regularly.  Attach bath  mats securely with double-sided non-slip rug tape.  Do not have throw rugs and other things on the floor that can make you trip. What can I do in the bedroom?  Use night lights.  Make sure that you have a light by your bed that is easy to reach.  Do not use any sheets or blankets that are too big for your bed. They should not hang down onto the floor.  Have a firm chair that has side arms. You can use this for support while you get dressed.  Do not have throw rugs and other things on the floor that can make you trip. What can I do in the kitchen?  Clean up any spills right away.  Avoid walking on wet floors.  Keep items that you use a lot in easy-to-reach places.  If you need to reach something above you, use a strong step stool that has a grab bar.  Keep electrical cords out of the way.  Do not use floor polish or wax that makes floors slippery. If you must use wax, use non-skid floor wax.  Do not have throw rugs and other things on the  floor that can make you trip. What can I do with my stairs?  Do not leave any items on the stairs.  Make sure that there are handrails on both sides of the stairs and use them. Fix handrails that are broken or loose. Make sure that handrails are as long as the stairways.  Check any carpeting to make sure that it is firmly attached to the stairs. Fix any carpet that is loose or worn.  Avoid having throw rugs at the top or bottom of the stairs. If you do have throw rugs, attach them to the floor with carpet tape.  Make sure that you have a light switch at the top of the stairs and the bottom of the stairs. If you do not have them, ask someone to add them for you. What else can I do to help prevent falls?  Wear shoes that:  Do not have high heels.  Have rubber bottoms.  Are comfortable and fit you well.  Are closed at the toe. Do not wear sandals.  If you use a stepladder:  Make sure that it is fully opened. Do not climb a closed  stepladder.  Make sure that both sides of the stepladder are locked into place.  Ask someone to hold it for you, if possible.  Clearly mark and make sure that you can see:  Any grab bars or handrails.  First and last steps.  Where the edge of each step is.  Use tools that help you move around (mobility aids) if they are needed. These include:  Canes.  Walkers.  Scooters.  Crutches.  Turn on the lights when you go into a dark area. Replace any light bulbs as soon as they burn out.  Set up your furniture so you have a clear path. Avoid moving your furniture around.  If any of your floors are uneven, fix them.  If there are any pets around you, be aware of where they are.  Review your medicines with your doctor. Some medicines can make you feel dizzy. This can increase your chance of falling. Ask your doctor what other things that you can do to help prevent falls. This information is not intended to replace advice given to you by your health care provider. Make sure you discuss any questions you have with your health care provider. Document Released: 09/27/2009 Document Revised: 05/08/2016 Document Reviewed: 01/05/2015 Elsevier Interactive Patient Education  2017 [employer].

## 2018-07-13 ENCOUNTER — Telehealth: Payer: Self-pay

## 2018-07-13 LAB — COMPREHENSIVE METABOLIC PANEL
ALK PHOS: 80 IU/L (ref 39–117)
ALT: 19 IU/L (ref 0–44)
AST: 18 IU/L (ref 0–40)
Albumin/Globulin Ratio: 1.7 (ref 1.2–2.2)
Albumin: 4.3 g/dL (ref 3.5–4.8)
BILIRUBIN TOTAL: 0.4 mg/dL (ref 0.0–1.2)
BUN/Creatinine Ratio: 8 — ABNORMAL LOW (ref 10–24)
BUN: 10 mg/dL (ref 8–27)
CHLORIDE: 104 mmol/L (ref 96–106)
CO2: 24 mmol/L (ref 20–29)
Calcium: 9.6 mg/dL (ref 8.6–10.2)
Creatinine, Ser: 1.26 mg/dL (ref 0.76–1.27)
GFR calc Af Amer: 65 mL/min/{1.73_m2} (ref >59)
GFR calc non Af Amer: 56 mL/min/{1.73_m2} — ABNORMAL LOW (ref >59)
GLUCOSE: 102 mg/dL — AB (ref 65–99)
Globulin, Total: 2.5 g/dL (ref 1.5–4.5)
Potassium: 4.3 mmol/L (ref 3.5–5.2)
Sodium: 143 mmol/L (ref 134–144)
Total Protein: 6.8 g/dL (ref 6.0–8.5)

## 2018-07-13 LAB — LIPID PANEL
CHOLESTEROL TOTAL: 134 mg/dL (ref 100–199)
Chol/HDL Ratio: 3 ratio (ref 0.0–5.0)
HDL: 45 mg/dL (ref >39)
LDL Calculated: 59 mg/dL (ref 0–99)
Triglycerides: 150 mg/dL — ABNORMAL HIGH (ref 0–149)
VLDL CHOLESTEROL CAL: 30 mg/dL (ref 5–40)

## 2018-07-13 LAB — CBC WITH DIFFERENTIAL/PLATELET
BASOS ABS: 0.1 10*3/uL (ref 0.0–0.2)
Basos: 1 %
EOS (ABSOLUTE): 0.3 10*3/uL (ref 0.0–0.4)
Eos: 4 %
HEMATOCRIT: 44.6 % (ref 37.5–51.0)
HEMOGLOBIN: 14.5 g/dL (ref 13.0–17.7)
Immature Grans (Abs): 0 10*3/uL (ref 0.0–0.1)
Immature Granulocytes: 0 %
LYMPHS ABS: 1.6 10*3/uL (ref 0.7–3.1)
Lymphs: 27 %
MCH: 30.3 pg (ref 26.6–33.0)
MCHC: 32.5 g/dL (ref 31.5–35.7)
MCV: 93 fL (ref 79–97)
MONOCYTES: 10 %
MONOS ABS: 0.6 10*3/uL (ref 0.1–0.9)
NEUTROS ABS: 3.5 10*3/uL (ref 1.4–7.0)
Neutrophils: 58 %
PLATELETS: 287 10*3/uL (ref 150–450)
RBC: 4.79 x10E6/uL (ref 4.14–5.80)
RDW: 13.3 % (ref 12.3–15.4)
WBC: 6 10*3/uL (ref 3.4–10.8)

## 2018-07-13 LAB — TSH: TSH: 1.05 u[IU]/mL (ref 0.450–4.500)

## 2018-07-13 NOTE — Telephone Encounter (Signed)
Left message to call back  

## 2018-07-13 NOTE — Telephone Encounter (Signed)
-----   Message from Jerrol Banana., MD sent at 07/13/2018  1:47 PM EDT ----- Labs OK

## 2018-07-15 NOTE — Telephone Encounter (Signed)
Advised patient's wife of results.  

## 2018-07-15 NOTE — Telephone Encounter (Signed)
Left message to call back  

## 2018-09-14 DIAGNOSIS — D2261 Melanocytic nevi of right upper limb, including shoulder: Secondary | ICD-10-CM | POA: Diagnosis not present

## 2018-09-14 DIAGNOSIS — D485 Neoplasm of uncertain behavior of skin: Secondary | ICD-10-CM | POA: Diagnosis not present

## 2018-09-14 DIAGNOSIS — D2271 Melanocytic nevi of right lower limb, including hip: Secondary | ICD-10-CM | POA: Diagnosis not present

## 2018-09-14 DIAGNOSIS — D2262 Melanocytic nevi of left upper limb, including shoulder: Secondary | ICD-10-CM | POA: Diagnosis not present

## 2018-09-14 DIAGNOSIS — D2272 Melanocytic nevi of left lower limb, including hip: Secondary | ICD-10-CM | POA: Diagnosis not present

## 2018-09-14 DIAGNOSIS — C44519 Basal cell carcinoma of skin of other part of trunk: Secondary | ICD-10-CM | POA: Diagnosis not present

## 2018-09-14 DIAGNOSIS — D225 Melanocytic nevi of trunk: Secondary | ICD-10-CM | POA: Diagnosis not present

## 2018-10-14 DIAGNOSIS — G3109 Other frontotemporal dementia: Secondary | ICD-10-CM | POA: Diagnosis not present

## 2018-10-14 DIAGNOSIS — F0280 Dementia in other diseases classified elsewhere without behavioral disturbance: Secondary | ICD-10-CM | POA: Diagnosis not present

## 2018-10-15 DIAGNOSIS — G3109 Other frontotemporal dementia: Secondary | ICD-10-CM | POA: Diagnosis not present

## 2018-10-15 DIAGNOSIS — F0280 Dementia in other diseases classified elsewhere without behavioral disturbance: Secondary | ICD-10-CM | POA: Diagnosis not present

## 2018-10-28 DIAGNOSIS — C44519 Basal cell carcinoma of skin of other part of trunk: Secondary | ICD-10-CM | POA: Diagnosis not present

## 2018-12-07 ENCOUNTER — Emergency Department: Payer: PPO

## 2018-12-07 ENCOUNTER — Encounter: Payer: Self-pay | Admitting: Emergency Medicine

## 2018-12-07 ENCOUNTER — Other Ambulatory Visit: Payer: Self-pay

## 2018-12-07 ENCOUNTER — Emergency Department
Admission: EM | Admit: 2018-12-07 | Discharge: 2018-12-07 | Disposition: A | Discharge status: Home / Self Care | Payer: PPO | Attending: Emergency Medicine | Admitting: Emergency Medicine

## 2018-12-07 DIAGNOSIS — Z85828 Personal history of other malignant neoplasm of skin: Secondary | ICD-10-CM | POA: Diagnosis not present

## 2018-12-07 DIAGNOSIS — W0110XA Fall on same level from slipping, tripping and stumbling with subsequent striking against unspecified object, initial encounter: Secondary | ICD-10-CM | POA: Diagnosis not present

## 2018-12-07 DIAGNOSIS — R079 Chest pain, unspecified: Secondary | ICD-10-CM | POA: Insufficient documentation

## 2018-12-07 DIAGNOSIS — Z79899 Other long term (current) drug therapy: Secondary | ICD-10-CM | POA: Insufficient documentation

## 2018-12-07 DIAGNOSIS — F329 Major depressive disorder, single episode, unspecified: Secondary | ICD-10-CM | POA: Insufficient documentation

## 2018-12-07 DIAGNOSIS — I10 Essential (primary) hypertension: Secondary | ICD-10-CM | POA: Diagnosis not present

## 2018-12-07 DIAGNOSIS — S0990XA Unspecified injury of head, initial encounter: Secondary | ICD-10-CM | POA: Diagnosis not present

## 2018-12-07 DIAGNOSIS — W19XXXA Unspecified fall, initial encounter: Secondary | ICD-10-CM

## 2018-12-07 DIAGNOSIS — F0390 Unspecified dementia without behavioral disturbance: Secondary | ICD-10-CM | POA: Insufficient documentation

## 2018-12-07 DIAGNOSIS — R51 Headache: Secondary | ICD-10-CM | POA: Diagnosis not present

## 2018-12-07 DIAGNOSIS — S299XXA Unspecified injury of thorax, initial encounter: Secondary | ICD-10-CM | POA: Diagnosis not present

## 2018-12-07 DIAGNOSIS — R0781 Pleurodynia: Secondary | ICD-10-CM | POA: Diagnosis not present

## 2018-12-07 NOTE — ED Triage Notes (Signed)
Pt wife reports this am he tripped and fell outside on the concrete. Pt reports he did not hit his head. Pt wife reports pt has dementia and a witness said he hit his head and that is what she is going by. Pt wife reports they went to eat lunch and he did not appear to be himself. Pt wife unable to state specifically what was different just reports given his dementia she feels he needs to be seen.

## 2018-12-07 NOTE — ED Triage Notes (Signed)
Pt c/o pain to his right side from the fall. Denies pain any where else. No redness noted to head or side.

## 2018-12-07 NOTE — ED Provider Notes (Signed)
[facility] Provider Note    ____________________________________________   I have reviewed the triage vital signs and the nursing notes.   HISTORY  Chief Complaint Fall; Altered Mental Status; and Flank Pain   History limited by and level 5 caveat due to dementia, most history obtained from family  HPI Vincent Ayala is a 72 y.o. male who presents to the emergency department today because of concern for fall and subsequent injury. The patient fell earlier today. Family thinks the fall occurred when he mis stepped on the sidewalk. Initially they did not think he hit his head however it sounds like a witness thinks he did. They did think he was acting somewhat abnormally however has been back to baseline for about an hour prior to my exam. The patient himself is only complaining of some right sided chest pain. Family has not noticed any recent illness.   Per medical record review patient has a history of dementia.   Past Medical History:  Diagnosis Date  . 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 Depression   . 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 Hypertension     Patient Active Problem List   Diagnosis Date Noted  . Dementia ([place]) 03/12/2016  . Depression 03/12/2016  . 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 Sleep apnea 03/12/2016  . 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 Hypertension 03/12/2016  . Allergic rhinitis 03/12/2016  . Osteoporosis 03/12/2016  . Polymyalgia rheumatica ([place]) 03/12/2016  . Cervical radiculopathy 03/12/2016    Past Surgical History:  Procedure Laterality Date  . 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 SPINE SURGERY     ruptured disc    Prior to Admission medications   Medication Sig Start Date End Date Taking? Authorizing Provider  calcium-vitamin D 250-100 MG-UNIT tablet Take 1 tablet by  mouth 2 (two) times daily.    [provider]  lamoTRIgine (LAMICTAL) 100 MG tablet Take 100 mg by mouth 2 (two) times daily.    [provider]  losartan (COZAAR) 50 MG tablet Take 1 tablet (50 mg total) by mouth daily. 04/08/18   Jerrol Banana., MD  Melatonin 3 MG TABS Take 6 mg by mouth at bedtime.     [provider]  memantine (NAMENDA) 10 MG tablet Take 10 mg by mouth daily.     [provider]  mirtazapine (REMERON) 15 MG tablet Take 1 tablet (15 mg total) by mouth at bedtime. 12/17/16   Jerrol Banana., MD  Multiple Vitamin (MULTIVITAMIN) tablet Take 1 tablet by mouth daily.    [provider]  QUEtiapine (SEROQUEL) 25 MG tablet Take 25 mg by mouth 2 (two) times daily.  09/28/17   [provider]  sertraline (ZOLOFT) 100 MG tablet TAKE 1 AND 1/2 TABLET EVERY DAY 06/11/18   Jerrol Banana., MD  traZODone (DESYREL) 50 MG tablet Take 50 mg by mouth at bedtime.    [provider]    Allergies Penicillins  No family history on file.  Social History Social History   Tobacco Use  . Smoking status: Never Smoker  . Smokeless tobacco: Never Used  Substance Use Topics  . Alcohol use: No  . Drug use: No    Review of Systems Constitutional: No fever/chills Eyes: No visual changes. ENT: No  sore throat. Cardiovascular: Right sided chest pain.  Respiratory: Denies shortness of breath. Gastrointestinal: No abdominal pain.  No nausea, no vomiting.  No diarrhea.   Genitourinary: Negative for dysuria. Musculoskeletal: Negative for back pain. Skin: Negative for rash. Neurological: Negative for headaches, focal weakness or numbness.  ____________________________________________   PHYSICAL EXAM:  VITAL SIGNS: ED Triage Vitals  Enc Vitals Group     BP 12/07/18 1422 101/72     Pulse Rate 12/07/18 1422 84     Resp 12/07/18 1422 20     Temp 12/07/18 1422 98 F (36.7 C)     Temp Source 12/07/18 1422 Oral      SpO2 12/07/18 1422 96 %     Weight 12/07/18 1423 250 lb (113.4 kg)     Height 12/07/18 1423 6\' 3"  (1.905 m)     Head Circumference --      Peak Flow --      Pain Score 12/07/18 1422 7   Constitutional: Awake and alert.  Eyes: Conjunctivae are normal.  ENT      Head: Normocephalic and atraumatic.      Nose: No congestion/rhinnorhea.      Mouth/Throat: Mucous membranes are moist.      Neck: No stridor. Hematological/Lymphatic/Immunilogical: No cervical lymphadenopathy. Cardiovascular: Normal rate, regular rhythm.  No murmurs, rubs, or gallops.  Respiratory: Normal respiratory effort without tachypnea nor retractions. Breath sounds are clear and equal bilaterally. No wheezes/rales/rhonchi. Gastrointestinal: Soft and non tender. No rebound. No guarding.  Genitourinary: Deferred Musculoskeletal: Normal range of motion in all extremities. Tender to palpation over the right lower chest wall.  Neurologic:  Dementia. Not completely oriented. Moving all extremities.  Skin:  Skin is warm, dry and intact. No rash noted. Psychiatric: Mood and affect are normal. Speech and behavior are normal. Patient exhibits appropriate insight and judgment.  ____________________________________________    LABS (pertinent positives/negatives)  None  ____________________________________________   EKG  None  ____________________________________________    RADIOLOGY  CT head No acute abnormality  Right rib series No fracture, no pneumothorax ____________________________________________   PROCEDURES  Procedures  ____________________________________________   INITIAL IMPRESSION / ASSESSMENT AND PLAN / ED COURSE  Pertinent labs & imaging results that were available during my care of the patient were reviewed by me and considered in my medical decision making (see chart for details).   Patient presented to the emergency department today after a fall.  Patient does have a history of  baseline dementia so cannot give a great history although family was concerned primarily for head injury and possible right chest wall injury.  CT head and right rib series both negative.  Patient is back to baseline per family.  Will plan on discharging home.  ____________________________________________   FINAL CLINICAL IMPRESSION(S) / ED DIAGNOSES  Final diagnoses:  Fall, initial encounter  Right-sided chest pain     Note: This dictation was prepared with Dragon dictation. Any transcriptional errors that result from this process are unintentional     Nance Pear, MD 12/07/18 [phone]

## 2018-12-07 NOTE — ED Notes (Signed)
Per pt son he has returned to baseline over the last hour - per pt son the pt has a mentality level of a 80-73 year old child Pt spouse reports that this am he fell and had a period of "unresponsiveness" that was described as not being able to follow commands and repeating himself

## 2018-12-07 NOTE — Discharge Instructions (Addendum)
Please seek medical attention for any high fevers, chest pain, shortness of breath, change in behavior, persistent vomiting, bloody stool or any other new or concerning symptoms.  

## 2018-12-30 ENCOUNTER — Ambulatory Visit (INDEPENDENT_AMBULATORY_CARE_PROVIDER_SITE_OTHER): Payer: PPO | Admitting: Family Medicine

## 2018-12-30 ENCOUNTER — Encounter: Payer: Self-pay | Admitting: Family Medicine

## 2018-12-30 VITALS — BP 118/82 | HR 96 | Temp 98.6°F | Resp 16 | Wt 283.0 lb

## 2018-12-30 DIAGNOSIS — G3109 Other frontotemporal dementia: Secondary | ICD-10-CM

## 2018-12-30 DIAGNOSIS — F0280 Dementia in other diseases classified elsewhere without behavioral disturbance: Secondary | ICD-10-CM | POA: Diagnosis not present

## 2018-12-30 DIAGNOSIS — Z111 Encounter for screening for respiratory tuberculosis: Secondary | ICD-10-CM | POA: Diagnosis not present

## 2018-12-30 DIAGNOSIS — I10 Essential (primary) hypertension: Secondary | ICD-10-CM | POA: Diagnosis not present

## 2018-12-30 NOTE — Progress Notes (Signed)
Patient: Vincent Ayala Male    DOB: April 04, 1946   73 y.o.   MRN: [id number] Visit Date: 12/30/2018  Today's Provider: Wilhemena Durie, MD   Chief Complaint  Patient presents with  . Follow-up   Subjective:     HPI  Patient comes in today for a follow up. He was last seen in the office 6 months ago. No medications were changed since last visit. Patient is getting ready to be enrolled in the day program at [facility] for dementia.  BP Readings from Last 3 Encounters:  12/30/18 118/82  12/07/18 (!) 152/86  07/12/18 138/72   He reports good compliance with treatment. He is not having side effects.  He is not exercising. He is adherent to low salt diet.   Outside blood pressures are checked occasionally. He is not experiencing any symptoms.   Weight trend: increasing steadily Wt Readings from Last 3 Encounters:  12/30/18 283 lb (128.4 kg)  12/07/18 250 lb (113.4 kg)  07/12/18 274 lb 6.4 oz (124.5 kg)    Current diet: well balanced  Patient will also be starting a Memory care program at [facility] and needs forms filled out. Patient's wife mentioned that he will go 2-3 times a week to see if this will help with his dementia.  Patient also had a fall about 3 weeks ago and was seen in the ER. He reports that he bruised his ribs. He still has tenderness on his right side in the area where he fell.    Allergies  Allergen Reactions  . Penicillins      Current Outpatient Medications:  .  calcium-vitamin D 250-100 MG-UNIT tablet, Take 1 tablet by mouth 2 (two) times daily., Disp: , Rfl:  .  lamoTRIgine (LAMICTAL) 100 MG tablet, Take 100 mg by mouth 2 (two) times daily., Disp: , Rfl:  .  losartan (COZAAR) 50 MG tablet, Take 1 tablet (50 mg total) by mouth daily., Disp: 30 tablet, Rfl: 12 .  Melatonin 3 MG TABS, Take 6 mg by mouth at bedtime. , Disp: , Rfl:  .  memantine (NAMENDA) 10 MG tablet, Take 10 mg by mouth daily. , Disp: , Rfl:  .  mirtazapine (REMERON) 15 MG  tablet, Take 1 tablet (15 mg total) by mouth at bedtime., Disp: 90 tablet, Rfl: 1 .  Multiple Vitamin (MULTIVITAMIN) tablet, Take 1 tablet by mouth daily., Disp: , Rfl:  .  QUEtiapine (SEROQUEL) 25 MG tablet, Take 25 mg by mouth 2 (two) times daily. , Disp: , Rfl:  .  sertraline (ZOLOFT) 100 MG tablet, TAKE 1 AND 1/2 TABLET EVERY DAY, Disp: 45 tablet, Rfl: 12 .  traZODone (DESYREL) 50 MG tablet, Take 50 mg by mouth at bedtime., Disp: , Rfl:   Review of Systems  Constitutional: Negative for activity change, appetite change, chills, diaphoresis, fatigue, fever and unexpected weight change.  HENT: Negative.   Eyes: Negative.   Respiratory: Negative for cough, chest tightness, shortness of breath and wheezing.   Cardiovascular: Negative for chest pain, palpitations and leg swelling.  Gastrointestinal: Negative for abdominal pain, nausea and vomiting.  Endocrine: Negative.   Musculoskeletal: Positive for myalgias. Negative for arthralgias and back pain.  Allergic/Immunologic: Negative.   Neurological: Negative.  Negative for dizziness, light-headedness and headaches.  Psychiatric/Behavioral: Negative.  Negative for agitation and decreased concentration. The patient is not nervous/anxious.     Social History   Tobacco Use  . Smoking status: Never Smoker  .  Smokeless tobacco: Never Used  Substance Use Topics  . Alcohol use: No      Objective:   BP 118/82 (BP Location: Left Arm, Patient Position: Sitting, Cuff Size: Large)   Pulse 96   Temp 98.6 F (37 C)   Resp 16   Wt 283 lb (128.4 kg)   BMI 35.37 kg/m  Vitals:   12/30/18 1027  BP: 118/82  Pulse: 96  Resp: 16  Temp: 98.6 F (37 C)  Weight: 283 lb (128.4 kg)     Physical Exam Constitutional:      Appearance: He is well-developed.  HENT:     Head: Normocephalic and atraumatic.     Right Ear: External ear normal.     Left Ear: External ear normal.     Nose: Nose normal.  Eyes:     General:        Left eye: No  discharge.     Conjunctiva/sclera: Conjunctivae normal.  Neck:     Thyroid: No thyromegaly.  Cardiovascular:     Rate and Rhythm: Normal rate and regular rhythm.     Heart sounds: Normal heart sounds.  Pulmonary:     Effort: Pulmonary effort is normal.     Breath sounds: Normal breath sounds.  Abdominal:     Palpations: Abdomen is soft.  Skin:    General: Skin is warm and dry.  Neurological:     Mental Status: He is alert.  Psychiatric:        Behavior: Behavior normal.         Assessment & Plan    1. 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  2. Essential hypertension   3. Screening-pulmonary TB   - QuantiFERON-TB Gold Plus    I have done the exam and reviewed the above chart and it is accurate to the best of my knowledge. Development worker, community has been used in this note in any air is in the dictation or transcription are unintentional.  Wilhemena Durie, MD  [place]

## 2019-01-03 LAB — QUANTIFERON-TB GOLD PLUS
QUANTIFERON TB2 AG VALUE: 0.04 [IU]/mL
QuantiFERON Mitogen Value: >10 IU/mL
QuantiFERON Nil Value: 0.02 IU/mL
QuantiFERON TB1 Ag Value: 0.02 IU/mL
QuantiFERON-TB Gold Plus: NEGATIVE

## 2019-01-03 LAB — SPECIMEN STATUS REPORT

## 2019-01-12 ENCOUNTER — Ambulatory Visit: Payer: Self-pay | Admitting: Family Medicine

## 2019-02-04 ENCOUNTER — Other Ambulatory Visit: Payer: Self-pay | Admitting: Family Medicine

## 2019-02-14 ENCOUNTER — Ambulatory Visit: Payer: PPO | Admitting: Family Medicine

## 2019-02-14 ENCOUNTER — Encounter: Payer: Self-pay | Admitting: Family Medicine

## 2019-02-14 ENCOUNTER — Ambulatory Visit (INDEPENDENT_AMBULATORY_CARE_PROVIDER_SITE_OTHER): Payer: PPO | Admitting: Family Medicine

## 2019-02-14 VITALS — BP 130/82 | HR 90 | Temp 97.6°F | Resp 16 | Wt 289.0 lb

## 2019-02-14 DIAGNOSIS — J069 Acute upper respiratory infection, unspecified: Secondary | ICD-10-CM

## 2019-02-14 DIAGNOSIS — F0390 Unspecified dementia without behavioral disturbance: Secondary | ICD-10-CM

## 2019-02-14 DIAGNOSIS — B9789 Other viral agents as the cause of diseases classified elsewhere: Secondary | ICD-10-CM | POA: Diagnosis not present

## 2019-02-14 NOTE — Progress Notes (Signed)
Patient: Vincent Ayala. [place] Male    DOB: November 13, 1946   73 y.o.   MRN: [id number] Visit Date: 02/14/2019  Today's Provider: Vernie Murders, PA   Chief Complaint  Patient presents with  . Cough   Subjective:     Cough  This is a new problem. Associated symptoms include nasal congestion. Pertinent negatives include no chest pain, chills, ear congestion, ear pain, fever, headaches, heartburn, hemoptysis, myalgias, postnasal drip, rash, rhinorrhea, sore throat, shortness of breath, sweats, weight loss or wheezing.    Patient has had productive cough for 3 days. Patient also has symptoms of sinus pressure, and fatigue. Patient has been taking otc Tylenol and otc cough syrup.   Past Medical History:  Diagnosis Date  . 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 Depression   . 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 Hypertension    Past Surgical History:  Procedure Laterality Date  . 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 SPINE SURGERY     ruptured disc   History reviewed. No pertinent family history.  Allergies  Allergen Reactions  . Penicillins     Current Outpatient Medications:  .  calcium-vitamin D 250-100 MG-UNIT tablet, Take 1 tablet by mouth 2 (two) times daily., Disp: , Rfl:  .  lamoTRIgine (LAMICTAL) 100 MG tablet, Take 100 mg by mouth 2 (two) times daily., Disp: , Rfl:  .  losartan (COZAAR) 50 MG tablet, TAKE ONE TABLET EVERY DAY, Disp: 30 tablet, Rfl: 12 .  Melatonin 3 MG TABS, Take 6 mg by mouth at bedtime. , Disp: , Rfl:  .  memantine (NAMENDA) 10 MG tablet, Take 10 mg by mouth daily. , Disp: , Rfl:  .  mirtazapine (REMERON) 15 MG tablet, Take 1 tablet (15 mg total) by mouth at bedtime., Disp: 90 tablet, Rfl: 1 .  Multiple Vitamin (MULTIVITAMIN) tablet, Take 1 tablet by mouth daily., Disp: , Rfl:  .  QUEtiapine (SEROQUEL) 25 MG tablet, Take 25 mg by mouth 2 (two) times  daily. , Disp: , Rfl:  .  sertraline (ZOLOFT) 100 MG tablet, TAKE 1 AND 1/2 TABLET EVERY DAY, Disp: 45 tablet, Rfl: 12 .  traZODone (DESYREL) 50 MG tablet, Take 50 mg by mouth at bedtime., Disp: , Rfl:   Review of Systems  Constitutional: Negative for appetite change, chills, fever and weight loss.  HENT: Positive for congestion and sinus pressure. Negative for ear pain, postnasal drip, rhinorrhea and sore throat.   Respiratory: Positive for cough. Negative for hemoptysis, chest tightness, shortness of breath and wheezing.   Cardiovascular: Negative for chest pain and palpitations.  Gastrointestinal: Negative for abdominal pain, heartburn, nausea and vomiting.  Musculoskeletal: Negative for myalgias.  Skin: Negative for rash.  Neurological: Negative for headaches.   Social History   Tobacco Use  . Smoking status: Never Smoker  . Smokeless tobacco: Never Used  Substance Use Topics  . Alcohol use: No     Objective:   BP 130/82 (BP Location: Right Arm, Patient Position: Sitting, Cuff Size: Large)   Pulse 90   Temp 97.6 F (36.4 C) (Oral)   Resp 16   Wt 289 lb (131.1 kg)   SpO2 93%   BMI 36.12 kg/m  Vitals:   02/14/19 1330  BP: 130/82  Pulse: 90  Resp: 16  Temp: 97.6 F (36.4  C)  TempSrc: Oral  SpO2: 93%  Weight: 289 lb (131.1 kg)   Physical Exam Constitutional:      General: He is not in acute distress.    Appearance: He is well-developed.  HENT:     Head: Normocephalic and atraumatic.     Right Ear: Hearing and tympanic membrane normal.     Left Ear: Hearing and tympanic membrane normal.     Nose: Nose normal.     Mouth/Throat:     Pharynx: Oropharynx is clear.  Eyes:     General: Lids are normal. No scleral icterus.       Right eye: No discharge.        Left eye: No discharge.     Conjunctiva/sclera: Conjunctivae normal.  Neck:     Musculoskeletal: Neck supple.  Cardiovascular:     Rate and Rhythm: Normal rate and regular rhythm.     Heart sounds: Normal  heart sounds.  Pulmonary:     Effort: Pulmonary effort is normal. No respiratory distress.     Breath sounds: Normal breath sounds. No wheezing, rhonchi or rales.  Musculoskeletal: Normal range of motion.  Lymphadenopathy:     Cervical: No cervical adenopathy.  Skin:    Findings: No lesion or rash.  Neurological:     Mental Status: He is alert.     Comments: Happy but confused and disoriented - history of dementia.  Psychiatric:        Speech: Speech normal.        Behavior: Behavior normal.        Thought Content: Thought content normal.       Assessment & Plan    1. Viral URI with cough Onset with cough over the past 3 days. No fever, sore throat, earache, chills or headache. Wife notices he has a dry cough and "doesn't feel good". Suspect URI with cough and may use Mucinex-DM prn. Increase fluid intake and recheck if symptoms progress or worsen.  2. Dementia without behavioral disturbance, unspecified dementia type (HCC) Unchanged. Disoriented and has a happy confused demeanor.   Vernie Murders, PA  [facility]

## 2019-02-15 ENCOUNTER — Ambulatory Visit: Payer: PPO | Admitting: Family Medicine

## 2019-04-27 ENCOUNTER — Telehealth: Payer: Self-pay | Admitting: Family Medicine

## 2019-04-27 NOTE — Telephone Encounter (Signed)
Pt's son called saying the EMT's came today because dad was experiencing some stroke like symptoms.  The EMT's done the screen and vitals and suggested that they follow up with Dr. Rosanna Randy and see if something else needs to be done.  Dad seems to be doing fine now.  Eating and acting normal  CB#  [phone]  Son's cell number

## 2019-04-28 ENCOUNTER — Encounter: Payer: Self-pay | Admitting: Family Medicine

## 2019-04-28 ENCOUNTER — Ambulatory Visit (INDEPENDENT_AMBULATORY_CARE_PROVIDER_SITE_OTHER): Payer: PPO | Admitting: Family Medicine

## 2019-04-28 ENCOUNTER — Telehealth: Payer: Self-pay

## 2019-04-28 ENCOUNTER — Other Ambulatory Visit: Payer: Self-pay

## 2019-04-28 VITALS — BP 118/78 | HR 81 | Temp 98.1°F | Ht 75.0 in | Wt 273.6 lb

## 2019-04-28 DIAGNOSIS — H9193 Unspecified hearing loss, bilateral: Secondary | ICD-10-CM | POA: Diagnosis not present

## 2019-04-28 DIAGNOSIS — F0280 Dementia in other diseases classified elsewhere without behavioral disturbance: Secondary | ICD-10-CM

## 2019-04-28 DIAGNOSIS — G4733 Obstructive sleep apnea (adult) (pediatric): Secondary | ICD-10-CM

## 2019-04-28 DIAGNOSIS — G3109 Other frontotemporal dementia: Secondary | ICD-10-CM | POA: Diagnosis not present

## 2019-04-28 DIAGNOSIS — G459 Transient cerebral ischemic attack, unspecified: Secondary | ICD-10-CM | POA: Diagnosis not present

## 2019-04-28 NOTE — Telephone Encounter (Signed)
I could see him today at 120 but not again until June. F/u with Neurology also good. He can see Dr Caryn Section or Simona Huh next week if desired.

## 2019-04-28 NOTE — Telephone Encounter (Signed)
Please review. Thanks!  

## 2019-04-28 NOTE — Patient Instructions (Signed)
Pt to start 81 mg aspirin daily

## 2019-04-28 NOTE — Telephone Encounter (Signed)
Erling Arrazola would like to speak regarding his father. Please call [phone].

## 2019-04-28 NOTE — Telephone Encounter (Signed)
Appt was scheduled today for evaluation.

## 2019-04-28 NOTE — Progress Notes (Signed)
Patient: Vincent Ayala. [place] Male    DOB: 1946/07/14   73 y.o.   MRN: [id number] Visit Date: 04/28/2019  Today's Provider: Wilhemena Durie, MD   Chief Complaint  Patient presents with  . stroke like symptoms    on 04/27/19 here for eval.   Subjective:     HPI  Pt's wife reports that he needs to be evaluated because he was having stroke like symptoms (left side of face drooping, not acting normal) on 04/28/19 and EMS was called and they screened the pt and did vitals and suggested pt fup with PCP to see if anything else needs to be done. On further questioning the patient was a little lethargic for little bit and EMS thought his left side of his face was drooping.  At the time EMS left all of the symptoms had resolved. Pt's wife reports pt is very agitated today and has been hard to get along with last several days.  Dementia is slowly getting worse and he is not violent but he is very moody in recent months.  Allergies  Allergen Reactions  . Penicillins      Current Outpatient Medications:  .  calcium-vitamin D 250-100 MG-UNIT tablet, Take 1 tablet by mouth 2 (two) times daily., Disp: , Rfl:  .  lamoTRIgine (LAMICTAL) 100 MG tablet, Take 100 mg by mouth 2 (two) times daily., Disp: , Rfl:  .  losartan (COZAAR) 50 MG tablet, TAKE ONE TABLET EVERY DAY, Disp: 30 tablet, Rfl: 12 .  Melatonin 3 MG TABS, Take 6 mg by mouth at bedtime. , Disp: , Rfl:  .  memantine (NAMENDA) 10 MG tablet, Take 10 mg by mouth daily. , Disp: , Rfl:  .  mirtazapine (REMERON) 15 MG tablet, Take 1 tablet (15 mg total) by mouth at bedtime., Disp: 90 tablet, Rfl: 1 .  Multiple Vitamin (MULTIVITAMIN) tablet, Take 1 tablet by mouth daily., Disp: , Rfl:  .  QUEtiapine (SEROQUEL) 25 MG tablet, Take 25 mg by mouth 2 (two) times daily. , Disp: , Rfl:  .  sertraline (ZOLOFT) 100 MG tablet, TAKE 1 AND 1/2 TABLET EVERY DAY, Disp: 45 tablet, Rfl: 12 .  traZODone (DESYREL) 50 MG tablet, Take 50 mg by mouth at bedtime.,  Disp: , Rfl:   Review of Systems  Constitutional: Negative.   HENT: Negative.   Eyes: Negative.   Respiratory: Negative.   Cardiovascular: Negative.   Gastrointestinal: Negative.   Endocrine: Negative.   Genitourinary: Negative.   Musculoskeletal: Negative.   Skin: Negative.   Allergic/Immunologic: Negative.   Neurological: Negative.   Hematological: Negative.   Psychiatric/Behavioral: Positive for agitation.    Social History   Tobacco Use  . Smoking status: Never Smoker  . Smokeless tobacco: Never Used  Substance Use Topics  . Alcohol use: No      Objective:   BP 118/78 (BP Location: Right Arm, Patient Position: Sitting, Cuff Size: Large)   Pulse 81   Temp 98.1 F (36.7 C) (Oral)   Ht 6\' 3"  (1.905 m)   Wt 273 lb 9.6 oz (124.1 kg)   SpO2 97%   BMI 34.20 kg/m  Vitals:   04/28/19 1339  BP: 118/78  Pulse: 81  Temp: 98.1 F (36.7 C)  TempSrc: Oral  SpO2: 97%  Weight: 273 lb 9.6 oz (124.1 kg)  Height: 6\' 3"  (1.905 m)     Physical Exam Vitals signs reviewed.  Constitutional:      Appearance: He  is well-developed.  HENT:     Head: Normocephalic and atraumatic.     Right Ear: External ear normal.     Left Ear: External ear normal.     Nose: Nose normal.  Eyes:     General:        Left eye: No discharge.     Conjunctiva/sclera: Conjunctivae normal.  Neck:     Thyroid: No thyromegaly.  Cardiovascular:     Rate and Rhythm: Normal rate and regular rhythm.     Heart sounds: Normal heart sounds.     Comments: No carotid bruit. Pulmonary:     Effort: Pulmonary effort is normal.     Breath sounds: Normal breath sounds.  Abdominal:     Palpations: Abdomen is soft.  Musculoskeletal:     Right lower leg: No edema.  Skin:    General: Skin is warm and dry.  Neurological:     General: No focal deficit present.     Mental Status: He is alert. Mental status is at baseline.  Psychiatric:        Mood and Affect: Mood normal.        Behavior: Behavior  normal.   ECG reveals normal sinus rhythm at about 75 with no obvious ischemic changes.      Assessment & Plan     1. TIA (transient ischemic attack) Patient may have had a TIA.  Neurologic exam at baseline today.  EEG reveals no atrial fibrillation or arrhythmia. Recommended to wife that we start aspirin 81 mg daily only.  Does carotid Dopplers and decided to not pursue this presently.  Not think he needs imaging today.  More than 50% of 25-minute visit is spent in counseling and coordination of care. - EKG 12-Lead  2. Frontotemporal dementia ([place]) Mood worse recently.  Discussed adjusting Seroquel or Lamictal today.  Patient has not seen Dr. Manuella Ghazi in a year.  Recommend they follow-up with Dr. Manuella Ghazi.  3. Decreased hearing of both ears   4. Obstructive sleep apnea syndrome     I have done the exam and reviewed the above chart and it is accurate to the best of my knowledge. Development worker, community has been used in this note in any air is in the dictation or transcription are unintentional.  Wilhemena Durie, MD  [place]

## 2019-06-06 ENCOUNTER — Telehealth: Payer: Self-pay | Admitting: Family Medicine

## 2019-06-06 NOTE — Chronic Care Management (AMB) (Signed)
°  Chronic Care Management   Outreach Note  06/06/2019 Name: Vincent Ayala. [place] MRN: [id number] DOB: [birth year]  Referred by: Jerrol Banana., MD Reason for referral : Chronic Care Management (Initial CCM outreach was unsuccessful )   Third unsuccessful telephone outreach was attempted today. The patient was referred to the case management team for assistance with chronic care management and care coordination. The patient's primary care provider has been notified of our unsuccessful attempts to make or maintain contact with the patient. The care management team is pleased to engage with this patient at any time in the future should he/she be interested in assistance from the care management team.   Follow Up Plan: The care management team is available to follow up with the patient after provider conversation with the patient regarding recommendation for care management engagement and subsequent re-referral to the care management team.   [place]  [email or web address]   ??[phone]

## 2019-06-22 DIAGNOSIS — Z8659 Personal history of other mental and behavioral disorders: Secondary | ICD-10-CM | POA: Diagnosis not present

## 2019-06-22 DIAGNOSIS — G3109 Other frontotemporal dementia: Secondary | ICD-10-CM | POA: Diagnosis not present

## 2019-06-22 DIAGNOSIS — F0280 Dementia in other diseases classified elsewhere without behavioral disturbance: Secondary | ICD-10-CM | POA: Diagnosis not present

## 2019-06-22 DIAGNOSIS — G479 Sleep disorder, unspecified: Secondary | ICD-10-CM | POA: Diagnosis not present

## 2019-06-22 DIAGNOSIS — F39 Unspecified mood [affective] disorder: Secondary | ICD-10-CM | POA: Diagnosis not present

## 2019-06-30 ENCOUNTER — Ambulatory Visit: Payer: Self-pay | Admitting: Family Medicine

## 2019-07-06 ENCOUNTER — Other Ambulatory Visit: Payer: Self-pay

## 2019-07-06 ENCOUNTER — Inpatient Hospital Stay
Admission: EM | Admit: 2019-07-06 | Discharge: 2019-07-09 | DRG: 470 | Disposition: A | Discharge status: Skilled Nursing Facility | Payer: PPO | Attending: Internal Medicine | Admitting: Internal Medicine

## 2019-07-06 ENCOUNTER — Emergency Department: Payer: PPO

## 2019-07-06 ENCOUNTER — Encounter: Payer: Self-pay | Admitting: Emergency Medicine

## 2019-07-06 DIAGNOSIS — R0689 Other abnormalities of breathing: Secondary | ICD-10-CM | POA: Diagnosis not present

## 2019-07-06 DIAGNOSIS — M6281 Muscle weakness (generalized): Secondary | ICD-10-CM | POA: Diagnosis not present

## 2019-07-06 DIAGNOSIS — G3109 Other frontotemporal dementia: Secondary | ICD-10-CM | POA: Diagnosis not present

## 2019-07-06 DIAGNOSIS — S72011A Unspecified intracapsular fracture of right femur, initial encounter for closed fracture: Principal | ICD-10-CM | POA: Diagnosis present

## 2019-07-06 DIAGNOSIS — Y9301 Activity, walking, marching and hiking: Secondary | ICD-10-CM | POA: Diagnosis present

## 2019-07-06 DIAGNOSIS — S72001A Fracture of unspecified part of neck of right femur, initial encounter for closed fracture: Secondary | ICD-10-CM | POA: Diagnosis present

## 2019-07-06 DIAGNOSIS — E569 Vitamin deficiency, unspecified: Secondary | ICD-10-CM | POA: Diagnosis not present

## 2019-07-06 DIAGNOSIS — F5104 Psychophysiologic insomnia: Secondary | ICD-10-CM | POA: Diagnosis not present

## 2019-07-06 DIAGNOSIS — Z85828 Personal history of other malignant neoplasm of skin: Secondary | ICD-10-CM

## 2019-07-06 DIAGNOSIS — R488 Other symbolic dysfunctions: Secondary | ICD-10-CM | POA: Diagnosis not present

## 2019-07-06 DIAGNOSIS — F3289 Other specified depressive episodes: Secondary | ICD-10-CM | POA: Diagnosis not present

## 2019-07-06 DIAGNOSIS — N183 Chronic kidney disease, stage 3 (moderate): Secondary | ICD-10-CM | POA: Diagnosis present

## 2019-07-06 DIAGNOSIS — R262 Difficulty in walking, not elsewhere classified: Secondary | ICD-10-CM | POA: Diagnosis not present

## 2019-07-06 DIAGNOSIS — S72041A Displaced fracture of base of neck of right femur, initial encounter for closed fracture: Secondary | ICD-10-CM | POA: Diagnosis not present

## 2019-07-06 DIAGNOSIS — W01198A Fall on same level from slipping, tripping and stumbling with subsequent striking against other object, initial encounter: Secondary | ICD-10-CM | POA: Diagnosis present

## 2019-07-06 DIAGNOSIS — Z03818 Encounter for observation for suspected exposure to other biological agents ruled out: Secondary | ICD-10-CM | POA: Diagnosis not present

## 2019-07-06 DIAGNOSIS — Z880 Allergy status to penicillin: Secondary | ICD-10-CM

## 2019-07-06 DIAGNOSIS — Z96641 Presence of right artificial hip joint: Secondary | ICD-10-CM | POA: Diagnosis not present

## 2019-07-06 DIAGNOSIS — F0390 Unspecified dementia without behavioral disturbance: Secondary | ICD-10-CM

## 2019-07-06 DIAGNOSIS — S299XXA Unspecified injury of thorax, initial encounter: Secondary | ICD-10-CM | POA: Diagnosis not present

## 2019-07-06 DIAGNOSIS — Z1159 Encounter for screening for other viral diseases: Secondary | ICD-10-CM | POA: Diagnosis not present

## 2019-07-06 DIAGNOSIS — G1220 Motor neuron disease, unspecified: Secondary | ICD-10-CM | POA: Diagnosis not present

## 2019-07-06 DIAGNOSIS — F919 Conduct disorder, unspecified: Secondary | ICD-10-CM | POA: Diagnosis present

## 2019-07-06 DIAGNOSIS — Z7689 Persons encountering health services in other specified circumstances: Secondary | ICD-10-CM | POA: Diagnosis not present

## 2019-07-06 DIAGNOSIS — W19XXXA Unspecified fall, initial encounter: Secondary | ICD-10-CM | POA: Diagnosis not present

## 2019-07-06 DIAGNOSIS — F329 Major depressive disorder, single episode, unspecified: Secondary | ICD-10-CM | POA: Diagnosis present

## 2019-07-06 DIAGNOSIS — G4730 Sleep apnea, unspecified: Secondary | ICD-10-CM | POA: Diagnosis not present

## 2019-07-06 DIAGNOSIS — R079 Chest pain, unspecified: Secondary | ICD-10-CM | POA: Diagnosis not present

## 2019-07-06 DIAGNOSIS — Z79899 Other long term (current) drug therapy: Secondary | ICD-10-CM | POA: Diagnosis not present

## 2019-07-06 DIAGNOSIS — I129 Hypertensive chronic kidney disease with stage 1 through stage 4 chronic kidney disease, or unspecified chronic kidney disease: Secondary | ICD-10-CM | POA: Diagnosis not present

## 2019-07-06 DIAGNOSIS — Z7401 Bed confinement status: Secondary | ICD-10-CM | POA: Diagnosis not present

## 2019-07-06 DIAGNOSIS — Z471 Aftercare following joint replacement surgery: Secondary | ICD-10-CM | POA: Diagnosis not present

## 2019-07-06 DIAGNOSIS — R52 Pain, unspecified: Secondary | ICD-10-CM | POA: Diagnosis not present

## 2019-07-06 DIAGNOSIS — M2550 Pain in unspecified joint: Secondary | ICD-10-CM | POA: Diagnosis not present

## 2019-07-06 DIAGNOSIS — R5381 Other malaise: Secondary | ICD-10-CM | POA: Diagnosis not present

## 2019-07-06 DIAGNOSIS — I10 Essential (primary) hypertension: Secondary | ICD-10-CM | POA: Diagnosis not present

## 2019-07-06 DIAGNOSIS — F0280 Dementia in other diseases classified elsewhere without behavioral disturbance: Secondary | ICD-10-CM | POA: Diagnosis not present

## 2019-07-06 DIAGNOSIS — I959 Hypotension, unspecified: Secondary | ICD-10-CM | POA: Diagnosis not present

## 2019-07-06 DIAGNOSIS — F0151 Vascular dementia with behavioral disturbance: Secondary | ICD-10-CM | POA: Diagnosis not present

## 2019-07-06 DIAGNOSIS — Z419 Encounter for procedure for purposes other than remedying health state, unspecified: Secondary | ICD-10-CM

## 2019-07-06 DIAGNOSIS — F03C0 Unspecified dementia, severe, without behavioral disturbance, psychotic disturbance, mood disturbance, and anxiety: Secondary | ICD-10-CM

## 2019-07-06 LAB — SURGICAL PCR SCREEN
MRSA, PCR: NEGATIVE
Staphylococcus aureus: NEGATIVE

## 2019-07-06 LAB — URINALYSIS, COMPLETE (UACMP) WITH MICROSCOPIC
Bacteria, UA: NONE SEEN
Bilirubin Urine: NEGATIVE
Glucose, UA: NEGATIVE mg/dL
Ketones, ur: 5 mg/dL — AB
Nitrite: NEGATIVE
Protein, ur: 30 mg/dL — AB
Specific Gravity, Urine: 1.019 (ref 1.005–1.030)
pH: 7 (ref 5.0–8.0)

## 2019-07-06 LAB — BASIC METABOLIC PANEL
Anion gap: 11 (ref 5–15)
BUN: 17 mg/dL (ref 8–23)
CO2: 22 mmol/L (ref 22–32)
Calcium: 9.1 mg/dL (ref 8.9–10.3)
Chloride: 107 mmol/L (ref 98–111)
Creatinine, Ser: 1.55 mg/dL — ABNORMAL HIGH (ref 0.61–1.24)
GFR calc Af Amer: 50 mL/min — ABNORMAL LOW (ref >60)
GFR calc non Af Amer: 43 mL/min — ABNORMAL LOW (ref >60)
Glucose, Bld: 133 mg/dL — ABNORMAL HIGH (ref 70–99)
Potassium: 3.7 mmol/L (ref 3.5–5.1)
Sodium: 140 mmol/L (ref 135–145)

## 2019-07-06 LAB — CBC WITH DIFFERENTIAL/PLATELET
Abs Immature Granulocytes: 0.07 10*3/uL (ref 0.00–0.07)
Basophils Absolute: 0.1 10*3/uL (ref 0.0–0.1)
Basophils Relative: 1 %
Eosinophils Absolute: 0.1 10*3/uL (ref 0.0–0.5)
Eosinophils Relative: 1 %
HCT: 41 % (ref 39.0–52.0)
Hemoglobin: 14.3 g/dL (ref 13.0–17.0)
Immature Granulocytes: 1 %
Lymphocytes Relative: 10 %
Lymphs Abs: 1.1 10*3/uL (ref 0.7–4.0)
MCH: 31.2 pg (ref 26.0–34.0)
MCHC: 34.9 g/dL (ref 30.0–36.0)
MCV: 89.3 fL (ref 80.0–100.0)
Monocytes Absolute: 0.6 10*3/uL (ref 0.1–1.0)
Monocytes Relative: 5 %
Neutro Abs: 9.1 10*3/uL — ABNORMAL HIGH (ref 1.7–7.7)
Neutrophils Relative %: 82 %
Platelets: 277 10*3/uL (ref 150–400)
RBC: 4.59 MIL/uL (ref 4.22–5.81)
RDW: 13.9 % (ref 11.5–15.5)
WBC: 11.1 10*3/uL — ABNORMAL HIGH (ref 4.0–10.5)
nRBC: 0 % (ref 0.0–0.2)

## 2019-07-06 LAB — SARS CORONAVIRUS 2 BY RT PCR (HOSPITAL ORDER, PERFORMED IN ~~LOC~~ HOSPITAL LAB): SARS Coronavirus 2: NEGATIVE

## 2019-07-06 MED ORDER — TRAZODONE HCL 50 MG PO TABS
50.0000 mg | ORAL_TABLET | Freq: Every day | ORAL | Status: DC
  Administered 2019-07-06 – 2019-07-08 (×3): 50 mg via ORAL
  Filled 2019-07-06 (×3): qty 1

## 2019-07-06 MED ORDER — LOSARTAN POTASSIUM 50 MG PO TABS: 50.0000 mg | ORAL_TABLET | Freq: Every day | ORAL | Status: DC

## 2019-07-06 MED ORDER — MORPHINE SULFATE (PF) 2 MG/ML IV SOLN
2.0000 mg | INTRAVENOUS | Status: DC | PRN
  Administered 2019-07-06 – 2019-07-07 (×5): 2 mg via INTRAVENOUS
  Filled 2019-07-06 (×4): qty 1

## 2019-07-06 MED ORDER — ENOXAPARIN SODIUM 40 MG/0.4ML ~~LOC~~ SOLN
40.0000 mg | SUBCUTANEOUS | Status: DC
  Administered 2019-07-06 – 2019-07-08 (×2): 40 mg via SUBCUTANEOUS
  Filled 2019-07-06 (×2): qty 0.4

## 2019-07-06 MED ORDER — CLINDAMYCIN PHOSPHATE 900 MG/50ML IV SOLN
900.0000 mg | INTRAVENOUS | Status: AC
  Administered 2019-07-07: 900 mg via INTRAVENOUS
  Filled 2019-07-06: qty 50

## 2019-07-06 MED ORDER — MORPHINE SULFATE (PF) 4 MG/ML IV SOLN
4.0000 mg | Freq: Once | INTRAVENOUS | Status: AC
  Administered 2019-07-06: 4 mg via INTRAVENOUS
  Filled 2019-07-06: qty 1

## 2019-07-06 MED ORDER — MEMANTINE HCL 5 MG PO TABS
10.0000 mg | ORAL_TABLET | Freq: Every day | ORAL | Status: DC
  Administered 2019-07-08 – 2019-07-09 (×2): 10 mg via ORAL
  Filled 2019-07-06 (×2): qty 2

## 2019-07-06 MED ORDER — MORPHINE SULFATE (PF) 2 MG/ML IV SOLN
INTRAVENOUS | Status: AC
  Administered 2019-07-06: 2 mg via INTRAVENOUS
  Filled 2019-07-06: qty 1

## 2019-07-06 MED ORDER — HYDROCODONE-ACETAMINOPHEN 5-325 MG PO TABS
1.0000 | ORAL_TABLET | Freq: Four times a day (QID) | ORAL | Status: DC | PRN
  Administered 2019-07-06: 2 via ORAL
  Filled 2019-07-06: qty 2

## 2019-07-06 MED ORDER — QUETIAPINE FUMARATE 25 MG PO TABS
25.0000 mg | ORAL_TABLET | Freq: Two times a day (BID) | ORAL | Status: DC
  Administered 2019-07-07 – 2019-07-09 (×5): 25 mg via ORAL
  Filled 2019-07-06 (×6): qty 1

## 2019-07-06 MED ORDER — SERTRALINE HCL 50 MG PO TABS
150.0000 mg | ORAL_TABLET | Freq: Every day | ORAL | Status: DC
  Administered 2019-07-08 – 2019-07-09 (×2): 150 mg via ORAL
  Filled 2019-07-06 (×2): qty 3

## 2019-07-06 MED ORDER — CALCIUM CITRATE-VITAMIN D 500-500 MG-UNIT PO CHEW
1.0000 | CHEWABLE_TABLET | Freq: Two times a day (BID) | ORAL | Status: DC
  Administered 2019-07-06 – 2019-07-09 (×5): 1 via ORAL
  Filled 2019-07-06 (×7): qty 1

## 2019-07-06 MED ORDER — MELATONIN 5 MG PO TABS
5.0000 mg | ORAL_TABLET | Freq: Every day | ORAL | Status: DC
  Administered 2019-07-06 – 2019-07-08 (×3): 5 mg via ORAL
  Filled 2019-07-06 (×4): qty 1

## 2019-07-06 MED ORDER — MUPIROCIN 2 % EX OINT
1.0000 "application " | TOPICAL_OINTMENT | Freq: Two times a day (BID) | CUTANEOUS | Status: DC
  Administered 2019-07-06 – 2019-07-09 (×6): 1 via NASAL
  Filled 2019-07-06: qty 22

## 2019-07-06 MED ORDER — ORAL CARE MOUTH RINSE
15.0000 mL | Freq: Two times a day (BID) | OROMUCOSAL | Status: DC
  Administered 2019-07-06 – 2019-07-09 (×4): 15 mL via OROMUCOSAL

## 2019-07-06 MED ORDER — ONDANSETRON HCL 4 MG/2ML IJ SOLN
4.0000 mg | Freq: Once | INTRAMUSCULAR | Status: AC
  Administered 2019-07-06: 4 mg via INTRAVENOUS
  Filled 2019-07-06: qty 2

## 2019-07-06 MED ORDER — HYDROMORPHONE HCL 1 MG/ML IJ SOLN
1.0000 mg | Freq: Once | INTRAMUSCULAR | Status: AC
  Administered 2019-07-06: 1 mg via INTRAVENOUS

## 2019-07-06 MED ORDER — MORPHINE SULFATE (PF) 2 MG/ML IV SOLN: 0.5000 mg | INTRAVENOUS | Status: DC | PRN

## 2019-07-06 MED ORDER — LAMOTRIGINE 25 MG PO TABS
100.0000 mg | ORAL_TABLET | Freq: Two times a day (BID) | ORAL | Status: DC
  Administered 2019-07-06 – 2019-07-09 (×6): 100 mg via ORAL
  Filled 2019-07-06 (×2): qty 4
  Filled 2019-07-06 (×2): qty 1
  Filled 2019-07-06 (×2): qty 4
  Filled 2019-07-06: qty 1
  Filled 2019-07-06 (×2): qty 4

## 2019-07-06 MED ORDER — SODIUM CHLORIDE 0.9 % IV SOLN
INTRAVENOUS | Status: DC
  Administered 2019-07-06: 18:00:00 via INTRAVENOUS

## 2019-07-06 MED ORDER — ADULT MULTIVITAMIN W/MINERALS CH
1.0000 | ORAL_TABLET | Freq: Every day | ORAL | Status: DC
  Administered 2019-07-08 – 2019-07-09 (×2): 1 via ORAL
  Filled 2019-07-06 (×2): qty 1

## 2019-07-06 MED ORDER — MIRTAZAPINE 15 MG PO TABS
15.0000 mg | ORAL_TABLET | Freq: Every day | ORAL | Status: DC
  Administered 2019-07-06 – 2019-07-08 (×3): 15 mg via ORAL
  Filled 2019-07-06 (×3): qty 1

## 2019-07-06 MED ORDER — HYDROMORPHONE HCL 1 MG/ML IJ SOLN
INTRAMUSCULAR | Status: AC
  Administered 2019-07-06: 1 mg via INTRAVENOUS
  Filled 2019-07-06: qty 1

## 2019-07-06 MED ORDER — TRANEXAMIC ACID-NACL 1000-0.7 MG/100ML-% IV SOLN
1000.0000 mg | INTRAVENOUS | Status: AC
  Administered 2019-07-07: 1000 mg via INTRAVENOUS
  Filled 2019-07-06: qty 100

## 2019-07-06 NOTE — ED Notes (Signed)
This RN notified by radiology pt refusing X-rays until he received something for pain. Pt visualized coming down hall resting in bed with NAD noted and arms resting behind his head, as soon as patient in room, pt began yelling out in pain about his leg. Will notify MD.

## 2019-07-06 NOTE — ED Notes (Signed)
Pt transported to X-ray at this time. Pt's son with patient to assist for X-ray and ensure patient cooperation at this time as patient is intermittently cooperative and intermittently calm with staff.

## 2019-07-06 NOTE — ED Provider Notes (Signed)
[facility] Emergency Department Provider Note  ____________________________________________  Time seen: Approximately 1:05 PM  I have reviewed the triage vital signs and the nursing notes.   HISTORY  Chief Complaint Fall    Level 5 Caveat: Portions of the History and Physical including HPI and review of systems are unable to be completely obtained due to patient being a poor historian   HPI Vincent Ayala is a 73 y.o. male with a history of depression hypertension and advanced dementia who is brought to the ED today after a fall at home.  He has right hip pain and unable to bear weight on the right leg.  Last ate at 8:30 AM today per the son.  Received 50 mcg of fentanyl IV by EMS      Past Medical History:  Diagnosis Date  . 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 Depression   . 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 Hypertension      Patient Active Problem List   Diagnosis Date Noted  . Dementia ([place]) 03/12/2016  . Depression 03/12/2016  . Frontotemporal dementia ([place]) 03/12/2016  . Sleep apnea 03/12/2016  . 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 Hypertension 03/12/2016  . Allergic rhinitis 03/12/2016  . Osteoporosis 03/12/2016  . Polymyalgia rheumatica ([place]) 03/12/2016  . Cervical radiculopathy 03/12/2016     Past Surgical History:  Procedure Laterality Date  . 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 SPINE SURGERY     ruptured disc     Prior to Admission medications   Medication Sig Start Date End Date Taking? Authorizing Provider  sertraline (ZOLOFT) 100 MG tablet TAKE 1 AND 1/2 TABLET EVERY DAY Patient taking differently: Take 150 mg by mouth daily.  02/05/19  Yes Jerrol Banana., MD  calcium-vitamin D 250-100 MG-UNIT tablet Take 1 tablet by mouth 2 (two) times daily.    [provider]  lamoTRIgine (LAMICTAL) 100  MG tablet Take 100 mg by mouth 2 (two) times daily.    [provider]  losartan (COZAAR) 50 MG tablet TAKE ONE TABLET EVERY DAY 02/05/19   Jerrol Banana., MD  Melatonin 3 MG TABS Take 6 mg by mouth at bedtime.     [provider]  memantine (NAMENDA) 10 MG tablet Take 10 mg by mouth daily.     [provider]  mirtazapine (REMERON) 15 MG tablet Take 1 tablet (15 mg total) by mouth at bedtime. 12/17/16   Jerrol Banana., MD  Multiple Vitamin (MULTIVITAMIN) tablet Take 1 tablet by mouth daily.    [provider]  QUEtiapine (SEROQUEL) 25 MG tablet Take 25 mg by mouth 2 (two) times daily.  09/28/17   [provider]  traZODone (DESYREL) 50 MG tablet Take 50 mg by mouth at bedtime.    [provider]     Allergies Penicillins   History reviewed. No pertinent family history.  Social History Social History   Tobacco Use  . Smoking status: Never Smoker  . Smokeless tobacco: Never Used  Substance Use Topics  . Alcohol use: No  . Drug use: No    Review of Systems Level 5 Caveat: Portions of the History and Physical including HPI and review of systems are unable to be completely obtained due to patient being a poor historian  Constitutional:   No known fever.  ENT:   No rhinorrhea. Cardiovascular:   No chest pain or syncope. Respiratory:   No dyspnea or cough. Gastrointestinal:   Negative for abdominal pain, vomiting and diarrhea.  Musculoskeletal:   Right hip pain as above ____________________________________________   PHYSICAL EXAM:  VITAL SIGNS: ED Triage Vitals  Enc Vitals Group     BP 07/06/19 1036 (!) 143/58     Pulse Rate 07/06/19 1036 70     Resp 07/06/19 1036 17     Temp 07/06/19 1036 98.3 F (36.8 C)     Temp Source 07/06/19 1036 Oral     SpO2 07/06/19 1036 96 %     Weight 07/06/19 1037 290 lb (131.5 kg)     Height 07/06/19 1037 6\' 3"  (1.905 m)     Head Circumference --      Peak Flow --       Pain Score 07/06/19 1037 10     Pain Loc --      Pain Edu? --      Excl. in [place]? --     Vital signs reviewed, nursing assessments reviewed.   Constitutional:   Alert and oriented to person and place. Non-toxic appearance. Eyes:   Conjunctivae are normal. EOMI. PERRL. ENT      Head:   Normocephalic and atraumatic.      Nose:   No congestion/rhinnorhea.       Mouth/Throat:   MMM, no pharyngeal erythema. No peritonsillar mass.       Neck:   No meningismus. Full ROM. Hematological/Lymphatic/Immunilogical:   No cervical lymphadenopathy. Cardiovascular:   RRR. Symmetric bilateral radial and DP pulses.  No murmurs. Cap refill less than 2 seconds. Respiratory:   Normal respiratory effort without tachypnea/retractions. Breath sounds are clear and equal bilaterally. No wheezes/rales/rhonchi. Gastrointestinal:   Soft and nontender. Non distended. There is no CVA tenderness.  No rebound, rigidity, or guarding.  Musculoskeletal:   Right hip tender to touch.  Right leg is shortened and rotated.  Normal distal pulse and perfusion. Neurologic:   Normal speech and language.  Motor grossly intact. No acute focal neurologic deficits are appreciated.  Skin:    Skin is warm, dry and intact. No rash noted.  No petechiae, purpura, or bullae.  ____________________________________________    LABS (pertinent positives/negatives) (all labs ordered are listed, but only abnormal results are displayed) Labs Reviewed  BASIC METABOLIC PANEL - Abnormal; Notable for the following components:      Result Value   Glucose, Bld 133 (*)    Creatinine, Ser 1.55 (*)    GFR calc non Af Amer 43 (*)    GFR calc Af Amer 50 (*)    All other components within normal limits  CBC WITH DIFFERENTIAL/PLATELET - Abnormal; Notable for the following components:   WBC 11.1 (*)    Neutro Abs 9.1 (*)    All other components within normal limits  SARS CORONAVIRUS 2 (HOSPITAL ORDER, [place] [facility])   URINALYSIS, COMPLETE (UACMP) WITH MICROSCOPIC   ____________________________________________   EKG  Interpreted by me Sinus rhythm rate of 67, left axis, normal intervals.  Normal QRS ST segments and T waves  ____________________________________________    RADIOLOGY  Dg Chest 1 View  Result Date: 07/06/2019 CLINICAL DATA:  Pain after fall EXAM: CHEST  1 VIEW COMPARISON:  December 07, 2017 FINDINGS: The heart size and mediastinal contours are within normal limits. Both lungs are clear. The visualized skeletal structures  are unremarkable. IMPRESSION: No active disease. Electronically Signed   By: Dorise Bullion III M.D   On: 07/06/2019 12:24   Dg Hip Unilat  With Pelvis 2-3 Views Right  Result Date: 07/06/2019 CLINICAL DATA:  Right hip pain after fall today. EXAM: DG HIP (WITH OR WITHOUT PELVIS) 2-3V RIGHT COMPARISON:  None. FINDINGS: Moderately displaced fracture is seen involving the proximal right femoral neck. No other bony abnormality is noted. IMPRESSION: Moderately displaced proximal right femoral neck fracture. Electronically Signed   By: Marijo Conception M.D.   On: 07/06/2019 12:26    ____________________________________________   PROCEDURES Procedures  ____________________________________________    CLINICAL IMPRESSION / ASSESSMENT AND PLAN / ED COURSE  Pertinent labs & imaging results that were available during my care of the patient were reviewed by me and considered in my medical decision making (see chart for details).   Janine Ores was evaluated in Emergency Department on 07/06/2019 for the symptoms described in the history of present illness. He was evaluated in the context of the global COVID-19 pandemic, which necessitated consideration that the patient might be at risk for infection with the SARS-CoV-2 virus that causes COVID-19. Institutional protocols and algorithms that pertain to the evaluation of patients at risk for COVID-19 are in a state of rapid  change based on information released by regulatory bodies including the CDC and federal and state organizations. These policies and algorithms were followed during the patient's care in the ED.   Patient comes the ED with right hip pain after a fall.  Clinically suspicious for a hip fracture.  X-rays obtained which do show a right femoral neck fracture.  Vital signs are unremarkable.  Patient was given additional morphine 4 mg IV and Dilaudid 1 mg IV for pain control.  Discussed with Dr. Harlow Mares of orthopedics who plans to do surgery tomorrow.  Will admit to hospitalist for preop management.      ____________________________________________   FINAL CLINICAL IMPRESSION(S) / ED DIAGNOSES    Final diagnoses:  Closed fracture of neck of right femur, initial encounter ([place])  Advanced dementia [facility])     ED Discharge Orders    None      Portions of this note were generated with dragon dictation software. Dictation errors may occur despite best attempts at proofreading.   Carrie Mew, MD 07/06/19 [phone]

## 2019-07-06 NOTE — Consult Note (Signed)
ORTHOPAEDIC CONSULTATION  REQUESTING PHYSICIAN: Lang Snow,*  Chief Complaint: right hip pain  HPI: Vincent Ayala is a 73 y.o. male who complains of right hip pain. He is a poor historian. His son is present. Please see H&P and ED notes for details. Denies any numbness, tingling or constitutional symptoms.  Past Medical History:  Diagnosis Date  . 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 Depression   . 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 Hypertension    Past Surgical History:  Procedure Laterality Date  . 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 SPINE SURGERY     ruptured disc   Social History   Socioeconomic History  . Marital status: Married    Spouse name: Not on file  . Number of children: 2  . Years of education: Not on file  . Highest education level: Some college, no degree  Occupational History  . Occupation: retired  Scientific laboratory technician  . Financial resource strain: Not hard at all  . Food insecurity    Worry: Never true    Inability: Never true  . Transportation needs    Medical: No    Non-medical: No  Tobacco Use  . Smoking status: Never Smoker  . Smokeless tobacco: Never Used  Substance and Sexual Activity  . Alcohol use: No  . Drug use: No  . Sexual activity: Not on file  Lifestyle  . Physical activity    Days per week: Not on file    Minutes per session: Not on file  . Stress: Not on file  Relationships  . Social Herbalist on phone: Not on file    Gets together: Not on file    Attends religious service: Not on file    Active member of club or organization: Not on file    Attends meetings of clubs or organizations: Not on file    Relationship status: Not on file  Other Topics Concern  . Not on file  Social History Narrative  . Not on file   History reviewed. No pertinent family history. Allergies  Allergen Reactions  .  Penicillins    Prior to Admission medications   Medication Sig Start Date End Date Taking? Authorizing Provider  calcium-vitamin D 250-100 MG-UNIT tablet Take 1 tablet by mouth 2 (two) times daily.   Yes [provider]  lamoTRIgine (LAMICTAL) 100 MG tablet Take 100 mg by mouth 2 (two) times daily.   Yes [provider]  losartan (COZAAR) 50 MG tablet TAKE ONE TABLET EVERY DAY Patient taking differently: Take 50 mg by mouth daily.  02/05/19  Yes Jerrol Banana., MD  Melatonin 3 MG TABS Take 6 mg by mouth at bedtime.    Yes [provider]  memantine (NAMENDA) 10 MG tablet Take 10 mg by mouth daily.    Yes [provider]  Multiple Vitamin (MULTIVITAMIN) tablet Take 1 tablet by mouth daily.   Yes [provider]  QUEtiapine (SEROQUEL) 50 MG tablet Take 50 mg by mouth 2 (two) times a day. 03/29/18  Yes [provider]  sertraline (ZOLOFT) 100 MG tablet TAKE 1 AND 1/2 TABLET EVERY DAY Patient taking differently: Take 150 mg by mouth daily.  02/05/19  Yes Jerrol Banana., MD  traZODone (DESYREL) 50 MG tablet Take 50 mg by mouth  at bedtime.   Yes [provider]  mirtazapine (REMERON) 15 MG tablet Take 1 tablet (15 mg total) by mouth at bedtime. Patient not taking: Reported on 07/06/2019 12/17/16   Jerrol Banana., MD   Dg Chest 1 View  Result Date: 07/06/2019 CLINICAL DATA:  Pain after fall EXAM: CHEST  1 VIEW COMPARISON:  December 07, 2017 FINDINGS: The heart size and mediastinal contours are within normal limits. Both lungs are clear. The visualized skeletal structures are unremarkable. IMPRESSION: No active disease. Electronically Signed   By: Dorise Bullion III M.D   On: 07/06/2019 12:24   Dg Hip Unilat  With Pelvis 2-3 Views Right  Result Date: 07/06/2019 CLINICAL DATA:  Right hip pain after fall today. EXAM: DG HIP (WITH OR WITHOUT PELVIS) 2-3V RIGHT COMPARISON:  None. FINDINGS: Moderately displaced fracture is  seen involving the proximal right femoral neck. No other bony abnormality is noted. IMPRESSION: Moderately displaced proximal right femoral neck fracture. Electronically Signed   By: Marijo Conception M.D.   On: 07/06/2019 12:26    Positive ROS: All other systems have been reviewed and were otherwise negative with the exception of those mentioned in the HPI and as above.  Physical Exam: General: Alert, no acute distress Cardiovascular: No pedal edema Respiratory: No cyanosis, no use of accessory musculature GI: No organomegaly, abdomen is soft and non-tender Skin: No lesions in the area of chief complaint Neurologic: Sensation intact distally Psychiatric: Patient is competent for consent with normal mood and affect Lymphatic: No axillary or cervical lymphadenopathy  MUSCULOSKELETAL: right lower extremity is shortened and externally rotated. Compartments soft. Good cap refill. Motor and sensory intact distally.  Assessment: Closed fracture of the right femoral neck  Plan: Plan a right hip hemiarthroplasty.  The diagnosis, risks, benefits and alternatives to treatment are all discussed in detail with the patient and family. Risks include but are not limited to bleeding, infection, deep vein thrombosis, pulmonary embolism, nerve or vascular injury, non-union, repeat operation, persistent pain, weakness, stiffness and death. He and his son understand and are eager to proceed.     Lovell Sheehan, MD    07/06/2019 5:45 PM

## 2019-07-06 NOTE — ED Notes (Signed)
MD at bedside at this time to discuss plan of care with patient.

## 2019-07-06 NOTE — ED Triage Notes (Signed)
Pt to ED via EMS from home c/o mechanical fall today, states walking out to car and tripped falling onto right side, denies LOC or hitting head.  Pt with pain to right hip, some shortening and rotation noted.  Pt has hx of dementia, presents alert and oriented to self and situation, diaphoretic, CBG 134 with EMS and given 18mcg fentanyl.

## 2019-07-06 NOTE — ED Notes (Signed)
Transport to floor requested.

## 2019-07-06 NOTE — Consult Note (Signed)
Full consult note to follow. Plan right hip arthroplasty tomorrow early afternoon if cleared by hospitalist. Please call with questions.

## 2019-07-06 NOTE — ED Notes (Signed)
Admitting NP at bedside at this time. Pt states he feels much better after the Dilaudid pain medication.

## 2019-07-06 NOTE — H&P (Addendum)
agree with detailed note below. Patient presentation and plan discussed on rounds with APP.    [address] at [place] NAME: Vincent Ayala    MR#:  [id number]  DATE OF BIRTH:  10/21/1946  DATE OF ADMISSION:  07/06/2019  PRIMARY CARE PHYSICIAN: Jerrol Banana., MD   REQUESTING/REFERRING PHYSICIAN: Brenton Grills, MD  CHIEF COMPLAINT:   Chief Complaint  Patient presents with  . Fall    HISTORY OF PRESENT ILLNESS:  73 y.o. male with pertinent past medical history of FTD, hypertension, depression, basal cell carcinoma on the left earlobe and back presenting to the ED with right hip pain status post a fall.  Patient is not a good historian so history mostly obtained from patient's son who is currently at the bedside.  Per patient's son, patient and wife were going for his regular checkup and while at the parking lot patient was walking out to car and tripped his right foot on the curb falling onto her right side.  Per patient's wife, patient did not lose consciousness or hit his head.  He however was complaining of pain in his right hip and refused to walk.  Per patient's son he required assistance to get in and out of the car.  Initially refused to be evaluated in the ED therefore patient's wife called his son who assisted her to bring him to the ED for further evaluation.  On arrival to the ED, he was afebrile with blood pressure 143/58 mm Hg and pulse rate 83 beats/min. There were no focal neurological deficits; he was alert but demonstrate any memory deficits.  Initial labs revealed creatinine 1.55 slightly above from baseline, WBC 11.1 otherwise unremarkable, COVID negative.  Chest x-ray shows no active cardiopulmonary disease.  X-ray of right hip with pelvis showed moderately displaced proximal right femoral neck fracture.  Hospitalist were called for admission for further management and possible surgery.  PAST MEDICAL HISTORY:   Past  Medical History:  Diagnosis Date  . 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 Depression   . 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 Hypertension     PAST SURGICAL HISTORY:   Past Surgical History:  Procedure Laterality Date  . 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 SPINE SURGERY     ruptured disc    SOCIAL HISTORY:   Social History   Tobacco Use  . Smoking status: Never Smoker  . Smokeless tobacco: Never Used  Substance Use Topics  . Alcohol use: No    FAMILY HISTORY:  History reviewed. No pertinent family history.  DRUG ALLERGIES:   Allergies  Allergen Reactions  . Penicillins     REVIEW OF SYSTEMS:   ROS Unable to assess due to underlying dementia MEDICATIONS AT HOME:   Prior to Admission medications   Medication Sig Start Date End Date Taking? Authorizing Provider  calcium-vitamin D 250-100 MG-UNIT tablet Take 1 tablet by mouth 2 (two) times daily.   Yes [provider]  lamoTRIgine (LAMICTAL) 100 MG tablet Take 100 mg by mouth 2 (two) times daily.   Yes [provider]  losartan (COZAAR) 50 MG tablet TAKE ONE TABLET EVERY DAY Patient taking differently: Take 50 mg by mouth daily.  02/05/19  Yes Jerrol Banana., MD  Melatonin 3 MG TABS Take 6 mg by  mouth at bedtime.    Yes [provider]  memantine (NAMENDA) 10 MG tablet Take 10 mg by mouth daily.    Yes [provider]  Multiple Vitamin (MULTIVITAMIN) tablet Take 1 tablet by mouth daily.   Yes [provider]  QUEtiapine (SEROQUEL) 50 MG tablet Take 50 mg by mouth 2 (two) times a day. 03/29/18  Yes [provider]  sertraline (ZOLOFT) 100 MG tablet TAKE 1 AND 1/2 TABLET EVERY DAY Patient taking differently: Take 150 mg by mouth daily.  02/05/19  Yes Jerrol Banana., MD  traZODone (DESYREL) 50 MG tablet Take 50 mg by mouth at bedtime.   Yes [provider]  mirtazapine (REMERON) 15 MG tablet Take 1 tablet (15 mg total) by mouth at bedtime. Patient not taking: Reported on 07/06/2019 12/17/16   Jerrol Banana., MD      VITAL SIGNS:  Blood pressure (!) 158/94, pulse 93, temperature 99.1 F (37.3 C), temperature source Oral, resp. rate 18, height 6\' 3"  (1.905 m), weight 131.5 kg, SpO2 96 %.  PHYSICAL EXAMINATION:   Physical Exam  GENERAL:  73 y.o.-year-old patient lying in the bed with no acute distress.  EYES: Pupils equal, round, reactive to light and accommodation. No scleral icterus. Extraocular muscles intact.  HEENT: Head atraumatic, normocephalic. Oropharynx and nasopharynx clear.  NECK:  Supple, no jugular venous distention. No thyroid enlargement, no tenderness.  LUNGS: Normal breath sounds bilaterally, no wheezing, rales,rhonchi or crepitation. No use of accessory muscles of respiration.  CARDIOVASCULAR: S1, S2 normal. No murmurs, rubs, or gallops.  ABDOMEN: Soft, nontender, nondistended. Bowel sounds present. No organomegaly or mass.  EXTREMITIES: No pedal edema, cyanosis, or clubbing. No rash or lesions. + pedal pulses MUSCULOSKELETAL: Normal bulk, and power was 5+ grip and elbow, knee, and ankle flexion and extension bilaterally.  Able to move left upper and lower extremity spontaneously.  Decreased range of motion in the right lower extremity due to pain. NEUROLOGIC:Alert and oriented x 1. CN 2-12 intact. Sensation to light touch and cold stimuli intact bilaterally. DTR's (biceps, patellar, and achilles) 2+ and symmetric throughout. Gait not tested due to safety concern. PSYCHIATRIC: The patient is alert and oriented x 3.  SKIN: No obvious rash, lesion, or ulcer.   DATA REVIEWED:  LABORATORY PANEL:   CBC Recent Labs  Lab 07/06/19 1115  WBC 11.1*  HGB 14.3  HCT 41.0  PLT 277   ------------------------------------------------------------------------------------------------------------------   Chemistries  Recent Labs  Lab 07/06/19 1115  NA 140  K 3.7  CL 107  CO2 22  GLUCOSE 133*  BUN 17  CREATININE 1.55*  CALCIUM 9.1   ------------------------------------------------------------------------------------------------------------------  Cardiac Enzymes No results for input(s): TROPONINI in the last 168 hours. ------------------------------------------------------------------------------------------------------------------  RADIOLOGY:  Dg Chest 1 View  Result Date: 07/06/2019 CLINICAL DATA:  Pain after fall EXAM: CHEST  1 VIEW COMPARISON:  December 07, 2017 FINDINGS: The heart size and mediastinal contours are within normal limits. Both lungs are clear. The visualized skeletal structures are unremarkable. IMPRESSION: No active disease. Electronically Signed   By: Dorise Bullion III M.D   On: 07/06/2019 12:24   Dg Hip Unilat  With Pelvis 2-3 Views Right  Result Date: 07/06/2019 CLINICAL DATA:  Right hip pain after fall today. EXAM: DG HIP (WITH OR WITHOUT PELVIS) 2-3V RIGHT COMPARISON:  None. FINDINGS: Moderately displaced fracture is seen involving the proximal right femoral neck. No other bony abnormality is noted. IMPRESSION: Moderately displaced proximal right femoral neck fracture.  Electronically Signed   By: Marijo Conception M.D.   On: 07/06/2019 12:26    EKG:  EKG: normal EKG, normal sinus rhythm, unchanged from previous tracings. Vent. rate 67 BPM PR interval * ms QRS duration 104 ms QT/QTc 421/445 ms P-R-T axes 49 -17 56 IMPRESSION AND PLAN:   73 y.o. male with past medical history of FTD, hypertension, depression, basal cell carcinoma on the left earlobe and back presenting to the ED with right hip pain status post a fall.  1.Right hip fracture -patient presenting with right hip pain due to mechanical fall. - X-ray shows closed fracture of the right femoral neck - Admit to orthopedic unit - PRN pain management - Orthopedic consult for possible right hip  hemiarthroplasty scheduled for tomorrow - Patient cleared for surgery with greater than average risk for post anesthesia complications due to underlying dementia - N.p.o. after midnight - Hold antiplatelets or anticoagulation  2. Hypertension-stable - Continue losartan  3. History of FTD with behavioral disturbances - Continue Namenda, Seroquel - Continue lamotrigine for mood stabilization  4. Depression - Continue sertraline, Remeron and trazodone  5. Chronic insomnia - Continue melatonin and Remeron  6. Acute on chronic Kidney disease- Creatine slughtly elevated from baseline - IVFs - Hold nephrotoxins - Continue to follow labs  7. DVT prophylaxis - Hold anti-coagulation pending procedure    All the records are reviewed and case discussed with ED provider. Management plans discussed with the patient, family and they are in agreement.  CODE STATUS: FULL  TOTAL TIME TAKING CARE OF THIS PATIENT: 50 minutes.    on 07/06/2019 at 7:41 PM  Rufina Falco, DNP, FNP-BC [facility] Nurse Practitioner Between 7am to 6pm - Pager [phone]  After 6pm go to [email or web address] - password [email or web address] [place] [facility]  Office  [phone]  CC: Primary care physician; Jerrol Banana., MD

## 2019-07-06 NOTE — ED Notes (Signed)
Patient transported to X-ray 

## 2019-07-06 NOTE — Anesthesia Preprocedure Evaluation (Addendum)
Anesthesia Evaluation  Patient identified by MRN, date of birth, ID band Patient awake    Reviewed: Allergy & Precautions, H&P , NPO status , Patient's Chart, lab work & pertinent test results, reviewed documented beta blocker date and time   Airway Mallampati: II   Neck ROM: full    Dental  (+) Poor Dentition   Pulmonary neg pulmonary ROS, sleep apnea ,    Pulmonary exam normal        Cardiovascular Exercise Tolerance: Poor hypertension, On Medications negative cardio ROS Normal cardiovascular exam Rhythm:regular Rate:Normal     Neuro/Psych PSYCHIATRIC DISORDERS Depression Dementia  Neuromuscular disease negative neurological ROS  negative psych ROS   GI/Hepatic negative GI ROS, Neg liver ROS,   Endo/Other  negative endocrine ROS  Renal/GU negative Renal ROS  negative genitourinary   Musculoskeletal   Abdominal   Peds  Hematology negative hematology ROS (+)   Anesthesia Other Findings Past Medical History: No date: Cancer ([place])     Comment:  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 No date: Depression No date: 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) ([place]) No date: Hypertension   Reproductive/Obstetrics negative OB ROS                            Anesthesia Physical Anesthesia Plan  ASA: III  Anesthesia Plan: General ETT   Post-op Pain Management:    Induction:   PONV Risk Score and Plan: 3  Airway Management Planned:   Additional Equipment:   Intra-op Plan:   Post-operative Plan:   Informed Consent: I have reviewed the patients History and Physical, chart, labs and discussed the procedure including the risks, benefits and alternatives for the proposed anesthesia with the patient or authorized representative who has indicated his/her understanding and acceptance.     Dental Advisory Given  Plan Discussed with: CRNA  Anesthesia Plan Comments:          Anesthesia Quick Evaluation

## 2019-07-06 NOTE — ED Notes (Signed)
ED TO INPATIENT HANDOFF REPORT  ED Nurse Name and Phone #:  Daisee Centner 18  S Name/Age/Gender Vincent Ayala 73 y.o. male Room/Bed: ED16A/ED16A  Code Status   Code Status: Not on file  Home/SNF/Other Home Patient oriented to: self and situation Is this baseline? Yes   Triage Complete: Triage complete  Chief Complaint fall  Triage Note Pt to ED via EMS from home c/o mechanical fall today, states walking out to car and tripped falling onto right side, denies LOC or hitting head.  Pt with pain to right hip, some shortening and rotation noted.  Pt has hx of dementia, presents alert and oriented to self and situation, diaphoretic, CBG 134 with EMS and given 24mcg fentanyl.   Allergies Allergies  Allergen Reactions  . Penicillins     Level of Care/Admitting Diagnosis ED Disposition    ED Disposition Condition Comment   Admit  The patient appears reasonably stabilized for admission considering the current resources, flow, and capabilities available in the ED at this time, and I doubt any other [facility] requiring further screening and/or treatment in the ED prior to admission is  present.       B Medical/Surgery History Past Medical History:  Diagnosis Date  . 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 Depression   . 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 Hypertension    Past Surgical History:  Procedure Laterality Date  . 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 SPINE SURGERY     ruptured disc     A IV Location/Drains/Wounds Patient Lines/Drains/Airways Status   Active Line/Drains/Airways    Name:   Placement date:   Placement time:   Site:   Days:   Peripheral IV 12/07/18 Right Hand   12/07/18    1634    Hand   211          Intake/Output Last 24 hours No intake or output data in the 24 hours ending 07/06/19 1356  Labs/Imaging Results for orders placed or performed during  the hospital encounter of 07/06/19 (from the past 48 hour(s))  Basic metabolic panel     Status: Abnormal   Collection Time: 07/06/19 11:15 AM  Result Value Ref Range   Sodium 140 135 - 145 mmol/L   Potassium 3.7 3.5 - 5.1 mmol/L   Chloride 107 98 - 111 mmol/L   CO2 22 22 - 32 mmol/L   Glucose, Bld 133 (H) 70 - 99 mg/dL   BUN 17 8 - 23 mg/dL   Creatinine, Ser 1.55 (H) 0.61 - 1.24 mg/dL   Calcium 9.1 8.9 - 10.3 mg/dL   GFR calc non Af Amer 43 (L) >60 mL/min   GFR calc Af Amer 50 (L) >60 mL/min   Anion gap 11 5 - 15    Comment: Performed at [facility], [address] [place], [place] [address]  CBC with Differential     Status: Abnormal   Collection Time: 07/06/19 11:15 AM  Result Value Ref Range   WBC 11.1 (H) 4.0 - 10.5 K/uL   RBC 4.59 4.22 - 5.81 MIL/uL   Hemoglobin 14.3 13.0 - 17.0 g/dL   HCT 41.0 39.0 - 52.0 %   MCV 89.3 80.0 - 100.0 fL   MCH 31.2 26.0 - 34.0 pg   MCHC 34.9 30.0 - 36.0 g/dL   RDW 13.9  11.5 - 15.5 %   Platelets 277 150 - 400 K/uL   nRBC 0.0 0.0 - 0.2 %   Neutrophils Relative % 82 %   Neutro Abs 9.1 (H) 1.7 - 7.7 K/uL   Lymphocytes Relative 10 %   Lymphs Abs 1.1 0.7 - 4.0 K/uL   Monocytes Relative 5 %   Monocytes Absolute 0.6 0.1 - 1.0 K/uL   Eosinophils Relative 1 %   Eosinophils Absolute 0.1 0.0 - 0.5 K/uL   Basophils Relative 1 %   Basophils Absolute 0.1 0.0 - 0.1 K/uL   Immature Granulocytes 1 %   Abs Immature Granulocytes 0.07 0.00 - 0.07 K/uL    Comment: Performed at [facility], [address], [place], [place] [address]   Dg Chest 1 View  Result Date: 07/06/2019 CLINICAL DATA:  Pain after fall EXAM: CHEST  1 VIEW COMPARISON:  December 07, 2017 FINDINGS: The heart size and mediastinal contours are within normal limits. Both lungs are clear. The visualized skeletal structures are unremarkable. IMPRESSION: No active disease. Electronically Signed   By: Dorise Bullion III M.D   On: 07/06/2019 12:24   Dg Hip Unilat  With  Pelvis 2-3 Views Right  Result Date: 07/06/2019 CLINICAL DATA:  Right hip pain after fall today. EXAM: DG HIP (WITH OR WITHOUT PELVIS) 2-3V RIGHT COMPARISON:  None. FINDINGS: Moderately displaced fracture is seen involving the proximal right femoral neck. No other bony abnormality is noted. IMPRESSION: Moderately displaced proximal right femoral neck fracture. Electronically Signed   By: Marijo Conception M.D.   On: 07/06/2019 12:26    Pending Labs Unresulted Labs (From admission, onward)    Start     Ordered   07/06/19 1229  SARS Coronavirus 2 (CEPHEID - Performed in [place] hospital lab), [facility] Order  (Asymptomatic Patients Labs)  ONCE - STAT,   STAT    Question:  Rule Out  Answer:  Yes   07/06/19 1229   07/06/19 1047  Urinalysis, Complete w Microscopic  ONCE - STAT,   STAT     07/06/19 1046          Vitals/Pain Today's Vitals   07/06/19 1036 07/06/19 1037 07/06/19 1127 07/06/19 1201  BP: (!) 143/58   (!) 162/111  Pulse: 70   83  Resp: 17   20  Temp: 98.3 F (36.8 C)     TempSrc: Oral     SpO2: 96%   100%  Weight:  131.5 kg    Height:  6\' 3"  (1.905 m)    PainSc:  10-Worst pain ever 10-Worst pain ever     Isolation Precautions No active isolations  Medications Medications  morphine 4 MG/ML injection 4 mg (4 mg Intravenous Given 07/06/19 1124)  ondansetron (ZOFRAN) injection 4 mg (4 mg Intravenous Given 07/06/19 1124)  HYDROmorphone (DILAUDID) injection 1 mg (1 mg Intravenous Given 07/06/19 1231)    Mobility UTA due to patient condition  High fall risk   Focused Assessments See H&P   R Recommendations: See Admitting Provider Note  Report given to:   Additional Notes:  Per admitting NP, pt NPO at midnight on 7/22

## 2019-07-06 NOTE — ED Notes (Addendum)
Pt returned from Xray at this time  

## 2019-07-07 ENCOUNTER — Encounter
Admission: EM | Disposition: A | Discharge status: Skilled Nursing Facility | Payer: Self-pay | Source: Home / Self Care | Attending: Internal Medicine

## 2019-07-07 ENCOUNTER — Inpatient Hospital Stay: Payer: PPO | Admitting: Anesthesiology

## 2019-07-07 ENCOUNTER — Inpatient Hospital Stay: Payer: PPO

## 2019-07-07 HISTORY — PX: HIP ARTHROPLASTY: SHX981

## 2019-07-07 SURGERY — HEMIARTHROPLASTY, HIP, DIRECT ANTERIOR APPROACH, FOR FRACTURE
Anesthesia: General | Site: Hip | Laterality: Right

## 2019-07-07 MED ORDER — EPHEDRINE SULFATE 50 MG/ML IJ SOLN
INTRAMUSCULAR | Status: DC | PRN
  Administered 2019-07-07 (×2): 10 mg via INTRAVENOUS

## 2019-07-07 MED ORDER — FENTANYL CITRATE (PF) 100 MCG/2ML IJ SOLN
25.0000 ug | INTRAMUSCULAR | Status: DC | PRN
  Administered 2019-07-07: 25 ug via INTRAVENOUS

## 2019-07-07 MED ORDER — METOCLOPRAMIDE HCL 10 MG PO TABS: 5.0000 mg | ORAL_TABLET | Freq: Three times a day (TID) | ORAL | Status: DC | PRN

## 2019-07-07 MED ORDER — ROCURONIUM BROMIDE 100 MG/10ML IV SOLN
INTRAVENOUS | Status: DC | PRN
  Administered 2019-07-07: 30 mg via INTRAVENOUS
  Administered 2019-07-07: 20 mg via INTRAVENOUS
  Administered 2019-07-07: 50 mg via INTRAVENOUS

## 2019-07-07 MED ORDER — DOCUSATE SODIUM 100 MG PO CAPS
100.0000 mg | ORAL_CAPSULE | Freq: Two times a day (BID) | ORAL | Status: DC
  Administered 2019-07-07 – 2019-07-09 (×4): 100 mg via ORAL
  Filled 2019-07-07 (×4): qty 1

## 2019-07-07 MED ORDER — SEVOFLURANE IN SOLN
RESPIRATORY_TRACT | Status: AC
  Filled 2019-07-07: qty 250

## 2019-07-07 MED ORDER — ONDANSETRON HCL 4 MG/2ML IJ SOLN: 4.0000 mg | Freq: Once | INTRAMUSCULAR | Status: DC | PRN

## 2019-07-07 MED ORDER — BUPIVACAINE-EPINEPHRINE (PF) 0.25% -1:200000 IJ SOLN
INTRAMUSCULAR | Status: DC | PRN
  Administered 2019-07-07: 20 mL

## 2019-07-07 MED ORDER — LACTATED RINGERS IV SOLN
INTRAVENOUS | Status: DC | PRN
  Administered 2019-07-07: 12:00:00 via INTRAVENOUS

## 2019-07-07 MED ORDER — SODIUM CHLORIDE 0.9 % IR SOLN
Status: DC | PRN
  Administered 2019-07-07: 250 mL

## 2019-07-07 MED ORDER — FENTANYL CITRATE (PF) 100 MCG/2ML IJ SOLN
INTRAMUSCULAR | Status: AC
  Administered 2019-07-07: 25 ug via INTRAVENOUS
  Filled 2019-07-07: qty 2

## 2019-07-07 MED ORDER — ONDANSETRON HCL 4 MG PO TABS: 4.0000 mg | ORAL_TABLET | Freq: Four times a day (QID) | ORAL | Status: DC | PRN

## 2019-07-07 MED ORDER — PHENOL 1.4 % MT LIQD
1.0000 | OROMUCOSAL | Status: DC | PRN
  Filled 2019-07-07: qty 177

## 2019-07-07 MED ORDER — ACETAMINOPHEN 325 MG PO TABS: 325.0000 mg | ORAL_TABLET | Freq: Four times a day (QID) | ORAL | Status: DC | PRN

## 2019-07-07 MED ORDER — MORPHINE SULFATE (PF) 2 MG/ML IV SOLN
0.5000 mg | INTRAVENOUS | Status: DC | PRN
  Administered 2019-07-07: 1 mg via INTRAVENOUS
  Filled 2019-07-07: qty 1

## 2019-07-07 MED ORDER — HYDROCODONE-ACETAMINOPHEN 5-325 MG PO TABS: 1.0000 | ORAL_TABLET | ORAL | Status: DC | PRN

## 2019-07-07 MED ORDER — LACTATED RINGERS IV SOLN
INTRAVENOUS | Status: DC
  Administered 2019-07-07 (×2): via INTRAVENOUS

## 2019-07-07 MED ORDER — GABAPENTIN 300 MG PO CAPS
300.0000 mg | ORAL_CAPSULE | Freq: Three times a day (TID) | ORAL | Status: DC
  Administered 2019-07-07 – 2019-07-09 (×5): 300 mg via ORAL
  Filled 2019-07-07 (×5): qty 1

## 2019-07-07 MED ORDER — DEXMEDETOMIDINE HCL 200 MCG/2ML IV SOLN
INTRAVENOUS | Status: DC | PRN
  Administered 2019-07-07 (×3): 8 ug via INTRAVENOUS

## 2019-07-07 MED ORDER — FENTANYL CITRATE (PF) 100 MCG/2ML IJ SOLN
INTRAMUSCULAR | Status: DC | PRN
  Administered 2019-07-07 (×2): 50 ug via INTRAVENOUS

## 2019-07-07 MED ORDER — DEXAMETHASONE SODIUM PHOSPHATE 10 MG/ML IJ SOLN
INTRAMUSCULAR | Status: DC | PRN
  Administered 2019-07-07: 4 mg via INTRAVENOUS

## 2019-07-07 MED ORDER — TRAMADOL HCL 50 MG PO TABS
50.0000 mg | ORAL_TABLET | Freq: Four times a day (QID) | ORAL | Status: DC
  Administered 2019-07-08 – 2019-07-09 (×6): 50 mg via ORAL
  Filled 2019-07-07 (×8): qty 1

## 2019-07-07 MED ORDER — MENTHOL 3 MG MT LOZG
1.0000 | LOZENGE | OROMUCOSAL | Status: DC | PRN
  Filled 2019-07-07: qty 9

## 2019-07-07 MED ORDER — PROPOFOL 10 MG/ML IV BOLUS
INTRAVENOUS | Status: DC | PRN
  Administered 2019-07-07: 150 mg via INTRAVENOUS

## 2019-07-07 MED ORDER — ACETAMINOPHEN 500 MG PO TABS
500.0000 mg | ORAL_TABLET | Freq: Four times a day (QID) | ORAL | Status: AC
  Administered 2019-07-08 (×2): 500 mg via ORAL
  Filled 2019-07-07 (×3): qty 1

## 2019-07-07 MED ORDER — SODIUM CHLORIDE 0.9 % IV SOLN
INTRAVENOUS | Status: DC | PRN
  Administered 2019-07-07: 500 mL

## 2019-07-07 MED ORDER — ONDANSETRON HCL 4 MG/2ML IJ SOLN: 4.0000 mg | Freq: Four times a day (QID) | INTRAMUSCULAR | Status: DC | PRN

## 2019-07-07 MED ORDER — HYDROCODONE-ACETAMINOPHEN 7.5-325 MG PO TABS: 1.0000 | ORAL_TABLET | ORAL | Status: DC | PRN

## 2019-07-07 MED ORDER — METOCLOPRAMIDE HCL 5 MG/ML IJ SOLN: 5.0000 mg | Freq: Three times a day (TID) | INTRAMUSCULAR | Status: DC | PRN

## 2019-07-07 MED ORDER — FENTANYL CITRATE (PF) 100 MCG/2ML IJ SOLN
INTRAMUSCULAR | Status: AC
  Filled 2019-07-07: qty 2

## 2019-07-07 MED ORDER — SUGAMMADEX SODIUM 200 MG/2ML IV SOLN
INTRAVENOUS | Status: DC | PRN
  Administered 2019-07-07: 200 mg via INTRAVENOUS

## 2019-07-07 MED ORDER — ONDANSETRON HCL 4 MG/2ML IJ SOLN
INTRAMUSCULAR | Status: DC | PRN
  Administered 2019-07-07: 4 mg via INTRAVENOUS

## 2019-07-07 MED ORDER — KETOROLAC TROMETHAMINE 15 MG/ML IJ SOLN
7.5000 mg | Freq: Four times a day (QID) | INTRAMUSCULAR | Status: AC
  Administered 2019-07-07 – 2019-07-08 (×3): 7.5 mg via INTRAVENOUS
  Filled 2019-07-07 (×3): qty 1

## 2019-07-07 MED ORDER — LIDOCAINE HCL (CARDIAC) PF 100 MG/5ML IV SOSY
PREFILLED_SYRINGE | INTRAVENOUS | Status: DC | PRN
  Administered 2019-07-07: 80 mg via INTRAVENOUS

## 2019-07-07 SURGICAL SUPPLY — 50 items
BLADE SAGITTAL WIDE XTHICK NO (BLADE) ×2 IMPLANT
BNDG COHESIVE 4X5 TAN STRL (GAUZE/BANDAGES/DRESSINGS) ×4 IMPLANT
BNDG COHESIVE 6X5 TAN STRL LF (GAUZE/BANDAGES/DRESSINGS) ×2 IMPLANT
BRUSH SCRUB EZ  4% CHG (MISCELLANEOUS) ×2
BRUSH SCRUB EZ 4% CHG (MISCELLANEOUS) ×2 IMPLANT
CHLORAPREP W/TINT 26 (MISCELLANEOUS) ×2 IMPLANT
COVER WAND RF STERILE (DRAPES) ×2 IMPLANT
DRAPE 3/4 80X56 (DRAPES) ×4 IMPLANT
DRAPE C-ARM 42X72 X-RAY (DRAPES) ×2 IMPLANT
DRAPE STERI IOBAN 125X83 (DRAPES) ×2 IMPLANT
DRAPE SURG 17X11 SM STRL (DRAPES) ×2 IMPLANT
DRAPE U-SHAPE 47X51 STRL (DRAPES) ×2 IMPLANT
DRSG AQUACEL AG ADV 3.5X10 (GAUZE/BANDAGES/DRESSINGS) ×2 IMPLANT
DRSG AQUACEL AG ADV 3.5X14 (GAUZE/BANDAGES/DRESSINGS) ×2 IMPLANT
ELECT BLADE 6.5 EXT (BLADE) ×2 IMPLANT
ELECT REM PT RETURN 9FT ADLT (ELECTROSURGICAL) ×2
ELECTRODE REM PT RTRN 9FT ADLT (ELECTROSURGICAL) ×1 IMPLANT
GAUZE XEROFORM 1X8 LF (GAUZE/BANDAGES/DRESSINGS) ×2 IMPLANT
GLOVE INDICATOR 8.0 STRL GRN (GLOVE) ×2 IMPLANT
GLOVE SURG ORTHO 8.0 STRL STRW (GLOVE) ×2 IMPLANT
GOWN STRL REUS W/ TWL LRG LVL3 (GOWN DISPOSABLE) ×2 IMPLANT
GOWN STRL REUS W/ TWL XL LVL3 (GOWN DISPOSABLE) ×1 IMPLANT
GOWN STRL REUS W/TWL LRG LVL3 (GOWN DISPOSABLE) ×2
GOWN STRL REUS W/TWL XL LVL3 (GOWN DISPOSABLE) ×1
HEAD 28MM 0 (Hips) ×2 IMPLANT
HEAD BIPOLAR 54MM (Hips) ×2 IMPLANT
HEMOSTAT SURGICEL 2X3 (HEMOSTASIS) ×2 IMPLANT
HOOD PEEL AWAY FLYTE STAYCOOL (MISCELLANEOUS) ×6 IMPLANT
IV NS 1000ML (IV SOLUTION) ×1
IV NS 1000ML BAXH (IV SOLUTION) ×1 IMPLANT
KIT PATIENT CARE HANA TABLE (KITS) ×2 IMPLANT
KIT TURNOVER CYSTO (KITS) ×2 IMPLANT
MAT ABSORB  FLUID 56X50 GRAY (MISCELLANEOUS) ×1
MAT ABSORB FLUID 56X50 GRAY (MISCELLANEOUS) ×1 IMPLANT
NDL SAFETY ECLIPSE 18X1.5 (NEEDLE) ×2 IMPLANT
NEEDLE HYPO 18GX1.5 SHARP (NEEDLE) ×2
NEEDLE HYPO 22GX1.5 SAFETY (NEEDLE) ×2 IMPLANT
NEEDLE SPNL 20GX3.5 QUINCKE YW (NEEDLE) ×2 IMPLANT
PACK HIP PROSTHESIS (MISCELLANEOUS) ×2 IMPLANT
PADDING CAST BLEND 4X4 NS (MISCELLANEOUS) ×4 IMPLANT
PILLOW ABDUCTION MEDIUM (MISCELLANEOUS) ×2 IMPLANT
PULSAVAC PLUS IRRIG FAN TIP (DISPOSABLE) ×2
STAPLER SKIN PROX 35W (STAPLE) ×2 IMPLANT
STEM STD COLLAR SZ9 12/14 (Stem) ×2 IMPLANT
SUT BONE WAX W31G (SUTURE) ×2 IMPLANT
SUT DVC 2 QUILL PDO  T11 36X36 (SUTURE) ×1
SUT DVC 2 QUILL PDO T11 36X36 (SUTURE) ×1 IMPLANT
SUT VIC AB 2-0 CT1 18 (SUTURE) ×2 IMPLANT
SYR 20ML LL LF (SYRINGE) ×2 IMPLANT
TIP FAN IRRIG PULSAVAC PLUS (DISPOSABLE) ×1 IMPLANT

## 2019-07-07 NOTE — Progress Notes (Signed)
Family Meeting Note  Advance Directive:yes  Today a meeting took place with the spouse.  Patient is unable to participate due JY:NWGNFA capacity dementia   The following clinical team members were present during this meeting:MD  The following were discussed:Patient's diagnosis:dementia hip fx , Patient's progosis: Unable to determine and Goals for treatment: Full Code  Additional follow-up to be provided: no need to update AD PC at d/c Time spent during discussion:16 minutes  Bettey Costa, MD

## 2019-07-07 NOTE — Anesthesia Post-op Follow-up Note (Signed)
Anesthesia QCDR form completed.        

## 2019-07-07 NOTE — Progress Notes (Signed)
[place] [facility] [place] NAME: Vincent Ayala    MR#:  [id number]  DATE OF BIRTH:  20-Feb-1946  SUBJECTIVE:   Wife at bedside.  Patient with dementia.  REVIEW OF SYSTEMS:    Unable to obtain due to dementia    Tolerating Diet: npo      DRUG ALLERGIES:   Allergies  Allergen Reactions  . Penicillins     VITALS:  Blood pressure (!) 161/71, pulse 78, temperature 98.4 F (36.9 C), resp. rate 18, height 6\' 3"  (1.905 m), weight 131.5 kg, SpO2 100 %.  PHYSICAL EXAMINATION:  Constitutional: Appears well-developed and well-nourished. No distress. HENT: Normocephalic. Marland Kitchen Oropharynx is clear and moist.  Eyes: Conjunctivae and EOM are normal. PERRLA, no scleral icterus.  Neck: Normal ROM. Neck supple. No JVD. No tracheal deviation. CVS: RRR, S1/S2 +, no murmurs, no gallops, no carotid bruit.  Pulmonary: Effort and breath sounds normal, no stridor, rhonchi, wheezes, rales.  Abdominal: Soft. BS +,  no distension, tenderness, rebound or guarding.  Musculoskeletal: Right leg shortened Neuro: Awake does not follow commands  skin: Skin is warm and dry. No rash noted. Psychiatric: Confused with dementia    LABORATORY PANEL:   CBC Recent Labs  Lab 07/06/19 1115  WBC 11.1*  HGB 14.3  HCT 41.0  PLT 277   ------------------------------------------------------------------------------------------------------------------  Chemistries  Recent Labs  Lab 07/06/19 1115  NA 140  K 3.7  CL 107  CO2 22  GLUCOSE 133*  BUN 17  CREATININE 1.55*  CALCIUM 9.1   ------------------------------------------------------------------------------------------------------------------  Cardiac Enzymes No results for input(s): TROPONINI in the last 168 hours. ------------------------------------------------------------------------------------------------------------------  RADIOLOGY:  Dg Chest 1 View  Result Date: 07/06/2019 CLINICAL DATA:  Pain after  fall EXAM: CHEST  1 VIEW COMPARISON:  December 07, 2017 FINDINGS: The heart size and mediastinal contours are within normal limits. Both lungs are clear. The visualized skeletal structures are unremarkable. IMPRESSION: No active disease. Electronically Signed   By: Dorise Bullion III M.D   On: 07/06/2019 12:24   Dg Hip Unilat  With Pelvis 2-3 Views Right  Result Date: 07/06/2019 CLINICAL DATA:  Right hip pain after fall today. EXAM: DG HIP (WITH OR WITHOUT PELVIS) 2-3V RIGHT COMPARISON:  None. FINDINGS: Moderately displaced fracture is seen involving the proximal right femoral neck. No other bony abnormality is noted. IMPRESSION: Moderately displaced proximal right femoral neck fracture. Electronically Signed   By: Marijo Conception M.D.   On: 07/06/2019 12:26     ASSESSMENT AND PLAN:   73 year old male with dementia who presented to the ER after mechanical fall and has suffered right hip fracture.  1.  Moderately displaced proximal right femoral neck fracture: Patient is planned for surgery today. Watch out for postoperative delirium  2.  Dementia: Continue to monitor for postop delirium Currently with telemetry psych Continue Namenda and Seroquel 3.  Depression: Continue Zoloft Remeron and trazodone  4.  Essential hypertension on losartan  5.  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  CODE STATUS: full  TOTAL TIME TAKING CARE OF THIS PATIENT: 30 minutes.     POSSIBLE D/C 2 days, DEPENDING ON CLINICAL CONDITION.   Bettey Costa M.D on 07/07/2019 at 12:26 PM  Between 7am to 6pm - Pager - [phone] After 6pm go to [email or web address] - Proofreader  [employer]  [phone]  CC: Primary care physician; Jerrol Banana., MD  Note: This dictation was prepared with Dragon dictation along with smaller phrase technology. Any transcriptional errors  that result from this process are unintentional.

## 2019-07-07 NOTE — Op Note (Signed)
07/07/2019  2:56 PM  PATIENT:  Vincent Ayala   MRN: [id number]  PRE-OPERATIVE DIAGNOSIS:  Displaced Subcapital fracture right hip   POST-OPERATIVE DIAGNOSIS: Same  Procedure: Right Hip Anterior Hip Hemiarthroplasty   Surgeon: Elyn Aquas. Harlow Mares, MD   Anesthesia: Spinal   EBL: 100 mL   Specimens: None   Drains: None   Components used: A size 9 Polarstem [employer], a 54 mm bipolar head    Description of the procedure in detail: After informed consent was obtained and the appropriate extremity marked in the pre-operative holding area, the patient was taken to the operating room and placed in the supine position on the fracture table. All pressure points were well padded and bilateral lower extremities were place in traction spars. The hip was prepped and draped in standard sterile fashion. A spinal anesthetic had been delivered by the anesthesia team. The skin and subcutaneous tissues were injected with a mixture of Marcaine with epinephrine for post-operative pain. A longitudinal incision approximately 10 cm in length was carried out from the anterior superior iliac spine to the greater trochanter. The tensor fascia was divided and blunt dissection was taken down to the level of the joint capsule. The lateral circumflex vessels were cauterized. Deep retractors were placed and a portion of the anterior capsule was excised. Using fluoroscopy the neck cut was planned and carried out with a sagittal saw. The head was passed from the field with use of a corkscrew and hip skid. Deep retractors were placed along the acetabulum and bony and soft tissue debris was removed.   Attention was then turned to the proximal femur. The leg was placed in extension and external rotation. The canal was opened and sequentially broached to a size 9. The trial components were placed and the hip relocated. The components were found to be in good position using fluoroscopy. The hip was dislocated and the trial  components removed. The final components were impacted in to position and the hip relocated. The final components were again check with fluoroscopy and found to be in good position. Hemostasis was achieved with electrocautery. The deep capsule was injected with Marcaine and epinephrine. The wound was irrigated with bacitracin laced normal saline and the tensor fascia closed with #2 Quill suture. The subcutaneous tissues were closed with 2-0 vicryl and staples for the skin. A sterile dressing was applied and an abduction pillow. Patient tolerated the procedure well and there were no apparent complication. Patient was taken to the recovery room in good condition.    Elyn Aquas. Harlow Mares, MD  07/07/2019 2:56 PM

## 2019-07-07 NOTE — Anesthesia Procedure Notes (Signed)
Procedure Name: Intubation Date/Time: 07/07/2019 12:22 PM Performed by: Esaw Grandchild, CRNA Pre-anesthesia Checklist: Patient identified, Emergency Drugs available, Suction available and Patient being monitored Patient Re-evaluated:Patient Re-evaluated prior to induction Oxygen Delivery Method: Circle system utilized Preoxygenation: Pre-oxygenation with 100% oxygen Induction Type: IV induction Ventilation: Mask ventilation without difficulty Laryngoscope Size: Mac and 3 Grade View: Grade I Tube type: Oral Tube size: 8.0 mm Number of attempts: 1 Airway Equipment and Method: Stylet and Oral airway Placement Confirmation: ETT inserted through vocal cords under direct vision,  positive ETCO2 and breath sounds checked- equal and bilateral Secured at: 25 cm Tube secured with: Tape Dental Injury: Teeth and Oropharynx as per pre-operative assessment  Comments: Pt intubated on stretcher w/o complication

## 2019-07-07 NOTE — Evaluation (Signed)
Physical Therapy Evaluation Patient Details Name: Vincent Ayala. Vincent Ayala MRN: [id number] DOB: 12-15-1946 Today's Date: 07/07/2019   History of Present Illness  74 y/o male s/p fall with R hip fracture, s/p hemiarthroplasty 07/07/19, h/o dementia.   Clinical Impression  Pt laying in bed on arrival, sleepy but easily able to wake, wife present and answers most questions secondary to pt dementia and lethargy.  Pt was able to participate with ~10 minutes of supine exercises but was pain limited and could not tolerate much R LE AROM/resistance.  He was too lethargic to safely attempt mobility this date, however given that he was relatively active prior to fracture wife is hopeful that he will be able to better participate when he is more awake despite dementia.  She does however understand that at this time it appears likely he will need to go to rehab on discharge.      Follow Up Recommendations SNF    Equipment Recommendations  (TBD at next venue of care)    Recommendations for Other Services       Precautions / Restrictions Precautions Precautions: Anterior Hip;Fall Precaution Booklet Issued: No Restrictions RLE Weight Bearing: Weight bearing as tolerated      Mobility  Bed Mobility               General bed mobility comments: deferred mobility POD0 secondary to lethargy, cognition, pain   Transfers                    Ambulation/Gait                Stairs            Wheelchair Mobility    Modified Rankin (Stroke Patients Only)       Balance                                             Pertinent Vitals/Pain Pain Assessment: Faces Faces Pain Scale: Hurts even more Pain Location: uncomfortable at baseline, increases with all R LE activity    Home Living Family/patient expects to be discharged to:: Skilled nursing facility Living Arrangements: Spouse/significant other Available Help at Discharge: Family Type of Home:  Apartment Home Access: Elevator     Home Layout: One level Home Equipment: Environmental consultant - 2 wheels      Prior Function Level of Independence: Independent         Comments: Pt able to be relatively independent despite demntia issues, walks 1/4 more more most days in community     Hand Dominance        Extremity/Trunk Assessment   Upper Extremity Assessment Upper Extremity Assessment: Overall WFL for tasks assessed;Difficult to assess due to impaired cognition    Lower Extremity Assessment Lower Extremity Assessment: Difficult to assess due to impaired cognition(expected post-op weakness on R, pain limited)       Communication   Communication: Expressive difficulties  Cognition Arousal/Alertness: Lethargic Behavior During Therapy: Restless;WFL for tasks assessed/performed Overall Cognitive Status: History of cognitive impairments - at baseline(lethargic at baseline)                                        General Comments      Exercises Total Joint Exercises Ankle Circles/Pumps: AROM;10 reps Quad Sets:  AROM;10 reps Short Arc Quad: AROM;Strengthening;10 reps(light resistance with full ext on R) Heel Slides: AAROM;10 reps(pain limited on R, guarded but eager to attempt) Hip ABduction/ADduction: 10 reps;AROM;AAROM(again pain limited on R)   Assessment/Plan    PT Assessment Patient needs continued PT services  PT Problem List Decreased strength;Decreased range of motion;Decreased activity tolerance;Decreased balance;Decreased mobility;Decreased coordination;Decreased cognition;Decreased knowledge of use of DME;Decreased safety awareness;Decreased knowledge of precautions;Pain       PT Treatment Interventions DME instruction;Gait training;Stair training;Functional mobility training;Therapeutic activities;Therapeutic exercise;Balance training;Neuromuscular re-education;Cognitive remediation;Patient/family education    PT Goals (Current goals can be found  in the Care Plan section)  Acute Rehab PT Goals Patient Stated Goal: pt would like to go home PT Goal Formulation: With patient/family Time For Goal Achievement: 07/21/19 Potential to Achieve Goals: Fair    Frequency BID   Barriers to discharge        Co-evaluation               AM-PAC PT "6 Clicks" Mobility  Outcome Measure Help needed turning from your back to your side while in a flat bed without using bedrails?: A Lot Help needed moving from lying on your back to sitting on the side of a flat bed without using bedrails?: A Lot Help needed moving to and from a bed to a chair (including a wheelchair)?: A Lot Help needed standing up from a chair using your arms (e.g., wheelchair or bedside chair)?: A Lot Help needed to walk in hospital room?: A Lot Help needed climbing 3-5 steps with a railing? : Total 6 Click Score: 11    End of Session Equipment Utilized During Treatment: Oxygen(3L) Activity Tolerance: Patient limited by pain Patient left: with bed alarm set;with call bell/phone within reach;with family/visitor present   PT Visit Diagnosis: Muscle weakness (generalized) (M62.81);Difficulty in walking, not elsewhere classified (R26.2)    Time: 3383-2919 PT Time Calculation (min) (ACUTE ONLY): 25 min   Charges:   PT Evaluation $PT Eval Low Complexity: 1 Low PT Treatments $Therapeutic Exercise: 8-22 mins        Kreg Shropshire, DPT 07/07/2019, 6:07 PM

## 2019-07-07 NOTE — Transfer of Care (Signed)
Immediate Anesthesia Transfer of Care Note  Patient: Vincent Ayala. [name]  Procedure(s) Performed: ARTHROPLASTY BIPOLAR HIP (HEMIARTHROPLASTY) (Right Hip)  Patient Location: PACU  Anesthesia Type:General  Level of Consciousness: drowsy  Airway & Oxygen Therapy: Patient Spontanous Breathing and Patient connected to face mask oxygen  Post-op Assessment: Report given to RN and Post -op Vital signs reviewed and stable  Post vital signs: stable  Last Vitals:  Vitals Value Taken Time  BP 112/53 07/07/19 1438  Temp 36.4 C 07/07/19 1438  Pulse 92 07/07/19 1444  Resp 19 07/07/19 1444  SpO2 96 % 07/07/19 1444  Vitals shown include unvalidated device data.  Last Pain:  Vitals:   07/07/19 1438  TempSrc:   PainSc: Asleep         Complications: No apparent anesthesia complications

## 2019-07-08 ENCOUNTER — Encounter: Payer: Self-pay | Admitting: Orthopedic Surgery

## 2019-07-08 LAB — BASIC METABOLIC PANEL
Anion gap: 7 (ref 5–15)
BUN: 28 mg/dL — ABNORMAL HIGH (ref 8–23)
CO2: 24 mmol/L (ref 22–32)
Calcium: 8.2 mg/dL — ABNORMAL LOW (ref 8.9–10.3)
Chloride: 110 mmol/L (ref 98–111)
Creatinine, Ser: 1.83 mg/dL — ABNORMAL HIGH (ref 0.61–1.24)
GFR calc Af Amer: 41 mL/min — ABNORMAL LOW (ref >60)
GFR calc non Af Amer: 36 mL/min — ABNORMAL LOW (ref >60)
Glucose, Bld: 146 mg/dL — ABNORMAL HIGH (ref 70–99)
Potassium: 3.9 mmol/L (ref 3.5–5.1)
Sodium: 141 mmol/L (ref 135–145)

## 2019-07-08 LAB — CBC
HCT: 34.2 % — ABNORMAL LOW (ref 39.0–52.0)
Hemoglobin: 11.2 g/dL — ABNORMAL LOW (ref 13.0–17.0)
MCH: 30.6 pg (ref 26.0–34.0)
MCHC: 32.7 g/dL (ref 30.0–36.0)
MCV: 93.4 fL (ref 80.0–100.0)
Platelets: 225 10*3/uL (ref 150–400)
RBC: 3.66 MIL/uL — ABNORMAL LOW (ref 4.22–5.81)
RDW: 14.7 % (ref 11.5–15.5)
WBC: 10.2 10*3/uL (ref 4.0–10.5)
nRBC: 0 % (ref 0.0–0.2)

## 2019-07-08 NOTE — Progress Notes (Signed)
Physical Therapy Treatment Patient Details Name: Vincent Ayala. Handy MRN: [id number] DOB: 1946-03-04 Today's Date: 07/08/2019    History of Present Illness 73 y/o male s/p fall with R hip fracture, s/p hemiarthroplasty 07/07/19, h/o dementia.     PT Comments    Pt in bed, initially stating he can't do it but easily encouraged.  To edge of bed with mod a x 1.  Once sitting, generally steady.  Stood with min a x 2 and was able to take very slow guarded steps to recliner.  Knees buckle slightly but he is able to correct.  Unable to ambulate further than recliner at bedside.  Remained up with son in room.  Discussed discharge plan with son and answered questions as appropriate.   Follow Up Recommendations  SNF     Equipment Recommendations  Rolling walker with 5" wheels    Recommendations for Other Services       Precautions / Restrictions Precautions Precautions: Anterior Hip;Fall Precaution Booklet Issued: No Restrictions Weight Bearing Restrictions: Yes RLE Weight Bearing: Weight bearing as tolerated    Mobility  Bed Mobility Overal bed mobility: Needs Assistance Bed Mobility: Supine to Sit     Supine to sit: Mod assist        Transfers Overall transfer level: Needs assistance Equipment used: Rolling walker (2 wheeled) Transfers: Sit to/from Stand Sit to Stand: Min assist;+2 physical assistance;+2 safety/equipment         General transfer comment: knees with slight buckling during stepping but is able to transfer to recliner.  +2 needed for safety.  Ambulation/Gait Ambulation/Gait assistance: Min assist;+2 physical assistance;+2 safety/equipment Gait Distance (Feet): 2 Feet Assistive device: Rolling walker (2 wheeled) Gait Pattern/deviations: Step-to pattern;Decreased step length - left;Decreased stance time - right;Decreased step length - right Gait velocity: decreased       Stairs             Wheelchair Mobility    Modified Rankin (Stroke Patients  Only)       Balance Overall balance assessment: Needs assistance Sitting-balance support: Feet unsupported Sitting balance-Leahy Scale: Fair     Standing balance support: Bilateral upper extremity supported Standing balance-Leahy Scale: Poor Standing balance comment: heavy reliance on walker generally steady in standing but knees with slight buckle                            Cognition Arousal/Alertness: Awake/alert Behavior During Therapy: WFL for tasks assessed/performed Overall Cognitive Status: History of cognitive impairments - at baseline                                        Exercises      General Comments        Pertinent Vitals/Pain Pain Assessment: Faces Faces Pain Scale: Hurts a little bit Pain Location: Generally comfortable with good pain control. Pain Descriptors / Indicators: Operative site guarding;Sore Pain Intervention(s): Limited activity within patient's tolerance;Monitored during session;RN gave pain meds during session    Home Living                      Prior Function            PT Goals (current goals can now be found in the care plan section) Progress towards PT goals: Progressing toward goals    Frequency    BID  PT Plan Current plan remains appropriate    Co-evaluation              AM-PAC PT "6 Clicks" Mobility   Outcome Measure  Help needed turning from your back to your side while in a flat bed without using bedrails?: A Lot Help needed moving from lying on your back to sitting on the side of a flat bed without using bedrails?: A Lot Help needed moving to and from a bed to a chair (including a wheelchair)?: A Lot Help needed standing up from a chair using your arms (e.g., wheelchair or bedside chair)?: A Lot Help needed to walk in hospital room?: A Lot Help needed climbing 3-5 steps with a railing? : Total 6 Click Score: 11    End of Session Equipment Utilized During  Treatment: Oxygen;Gait belt Activity Tolerance: Patient tolerated treatment well Patient left: in chair;with call bell/phone within reach;with chair alarm set;with family/visitor present         Time: 3295-1884 PT Time Calculation (min) (ACUTE ONLY): 18 min  Charges:  $Therapeutic Activity: 8-22 mins                    Chesley Noon, PTA 07/08/19, 1:20 PM

## 2019-07-08 NOTE — TOC Progression Note (Signed)
Transition of Care (TOC) - Progression Note    Patient Details  Name: Vincent Ayala MRN: [id number] Date of Birth: 11-Dec-1946  Transition of Care [facility]) CM/SW Contact  Su Hilt, RN Phone Number: 07/08/2019, 2:52 PM  Clinical Narrative:     Called and spoke to [place] at [place] to start authorization, provided needed information, awaiting decision        Expected Discharge Plan and Services           Expected Discharge Date: 07/09/19                                     Social Determinants of Health (SDOH) Interventions    Readmission Risk Interventions No flowsheet data found.

## 2019-07-08 NOTE — Progress Notes (Signed)
Physical Therapy Treatment Patient Details Name: Vincent Ayala. Vincent Ayala MRN: [id number] DOB: [birth year] Today's Date: 07/08/2019    History of Present Illness 73 y/o male s/p fall with R hip fracture, s/p hemiarthroplasty 07/07/19, h/o dementia.     PT Comments    Pt in recliner, stood with min a x 2 and transferred back to bed with min a x +2.  +2 assist required for safety of pt and staff due to stature and general LE weakness and dementia.  Participated in exercises as described below.  Returned to supine and positioned as appropriate.   Follow Up Recommendations  SNF     Equipment Recommendations  Rolling walker with 5" wheels    Recommendations for Other Services       Precautions / Restrictions Precautions Precautions: Anterior Hip;Fall Precaution Booklet Issued: No Restrictions Weight Bearing Restrictions: Yes RLE Weight Bearing: Weight bearing as tolerated    Mobility  Bed Mobility Overal bed mobility: Needs Assistance Bed Mobility: Sit to Supine     Supine to sit: Mod assist     General bed mobility comments: +2 to scoot up in bed  Transfers Overall transfer level: Needs assistance Equipment used: Rolling walker (2 wheeled) Transfers: Sit to/from Stand Sit to Stand: Min assist;+2 physical assistance;+2 safety/equipment         General transfer comment: knees with slight buckling during stepping but is able to transfer to recliner.  +2 needed for safety.  Ambulation/Gait Ambulation/Gait assistance: Min assist;+2 physical assistance;+2 safety/equipment Gait Distance (Feet): 2 Feet Assistive device: Rolling walker (2 wheeled) Gait Pattern/deviations: Step-to pattern;Decreased step length - left;Decreased stance time - right;Decreased step length - right Gait velocity: decreased       Stairs             Wheelchair Mobility    Modified Rankin (Stroke Patients Only)       Balance Overall balance assessment: Needs assistance Sitting-balance  support: Feet supported Sitting balance-Leahy Scale: Fair     Standing balance support: Bilateral upper extremity supported Standing balance-Leahy Scale: Poor Standing balance comment: heavy reliance on walker generally steady in standing but knees with slight buckle                            Cognition Arousal/Alertness: Awake/alert Behavior During Therapy: WFL for tasks assessed/performed Overall Cognitive Status: History of cognitive impairments - at baseline                                        Exercises Other Exercises Other Exercises: seated ankle pumps, LAQ and marches to tolerance    General Comments        Pertinent Vitals/Pain Pain Assessment: Faces Faces Pain Scale: Hurts a little bit Pain Location: Generally comfortable with good pain control. Pain Descriptors / Indicators: Operative site guarding;Sore Pain Intervention(s): Limited activity within patient's tolerance;Monitored during session    Home Living                      Prior Function            PT Goals (current goals can now be found in the care plan section) Progress towards PT goals: Progressing toward goals    Frequency    BID      PT Plan Current plan remains appropriate    Co-evaluation  AM-PAC PT "6 Clicks" Mobility   Outcome Measure  Help needed turning from your back to your side while in a flat bed without using bedrails?: A Lot Help needed moving from lying on your back to sitting on the side of a flat bed without using bedrails?: A Lot Help needed moving to and from a bed to a chair (including a wheelchair)?: A Lot Help needed standing up from a chair using your arms (e.g., wheelchair or bedside chair)?: A Lot Help needed to walk in hospital room?: A Lot Help needed climbing 3-5 steps with a railing? : Total 6 Click Score: 11    End of Session Equipment Utilized During Treatment: Oxygen;Gait belt Activity Tolerance:  Patient tolerated treatment well Patient left: in bed;with call bell/phone within reach;with bed alarm set;with family/visitor present         Time: 5284-1324 PT Time Calculation (min) (ACUTE ONLY): 13 min  Charges:  $Therapeutic Exercise: 8-22 mins $Therapeutic Activity: 8-22 mins                    Chesley Noon, PTA 07/08/19, 2:48 PM

## 2019-07-08 NOTE — Care Management Important Message (Signed)
Important Message  Patient Details  Name: Vincent Ayala. Janssen MRN: [id number] Date of Birth: 12/15/1946   Medicare Important Message Given:  Yes     Juliann Pulse A Dachelle Molzahn 07/08/2019, 12:01 PM

## 2019-07-08 NOTE — TOC Progression Note (Signed)
Transition of Care (TOC) - Progression Note    Patient Details  Name: Firmin Belisle. Boissonneault MRN: [id number] Date of Birth: Jan 03, 1946  Transition of Care [facility]) CM/SW Contact  Su Hilt, RN Phone Number: 07/08/2019, 3:31 PM  Clinical Narrative:     Reviewed bed offers with [name] the patients son.  He decided to go with [facility].  I called Tina at [facility] and accepted the bed as well as doing so in the [facility].       Expected Discharge Plan and Services           Expected Discharge Date: 07/09/19                                     Social Determinants of Health (SDOH) Interventions    Readmission Risk Interventions No flowsheet data found.

## 2019-07-08 NOTE — Progress Notes (Signed)
[facility] [place] NAME: Vincent Ayala    MR#:  [id number]  DATE OF BIRTH:  Jun 22, 1946  SUBJECTIVE:   Son is at bedside.  Patient is alert this morning.  He is postoperative day #1.  No acute events overnight.  REVIEW OF SYSTEMS:    Unable to obtain due to dementia    Tolerating Diet: yes      DRUG ALLERGIES:   Allergies  Allergen Reactions  . Penicillins     VITALS:  Blood pressure 110/61, pulse 76, temperature 98.2 F (36.8 C), temperature source Oral, resp. rate 17, height 6\' 3"  (1.905 m), weight 131.5 kg, SpO2 100 %.  PHYSICAL EXAMINATION:  Constitutional: Appears well-developed and well-nourished. No distress. HENT: Normocephalic. Marland Kitchen Oropharynx is clear and moist.  Eyes: Conjunctivae and EOM are normal. PERRLA, no scleral icterus.  Neck: Normal ROM. Neck supple. No JVD. No tracheal deviation. CVS: RRR, S1/S2 +, no murmurs, no gallops, no carotid bruit.  Pulmonary: Effort and breath sounds normal, no stridor, rhonchi, wheezes, rales.  Abdominal: Soft. BS +,  no distension, tenderness, rebound or guarding.  Musculoskeletal: Patient able to move both legs  neuro: Awake and alert oriented to name not place or time skin: Skin is warm and dry. No rash noted. Psychiatric: Confused with dementia    LABORATORY PANEL:   CBC Recent Labs  Lab 07/08/19 0412  WBC 10.2  HGB 11.2*  HCT 34.2*  PLT 225   ------------------------------------------------------------------------------------------------------------------  Chemistries  Recent Labs  Lab 07/08/19 0412  NA 141  K 3.9  CL 110  CO2 24  GLUCOSE 146*  BUN 28*  CREATININE 1.83*  CALCIUM 8.2*   ------------------------------------------------------------------------------------------------------------------  Cardiac Enzymes No results for input(s): TROPONINI in the last 168  hours. ------------------------------------------------------------------------------------------------------------------  RADIOLOGY:  Dg Chest 1 View  Result Date: 07/06/2019 CLINICAL DATA:  Pain after fall EXAM: CHEST  1 VIEW COMPARISON:  December 07, 2017 FINDINGS: The heart size and mediastinal contours are within normal limits. Both lungs are clear. The visualized skeletal structures are unremarkable. IMPRESSION: No active disease. Electronically Signed   By: Dorise Bullion III M.D   On: 07/06/2019 12:24   Dg Hip Operative Unilat W Or W/o Pelvis Right  Result Date: 07/07/2019 CLINICAL DATA:  RIGHT total hip replacement EXAM: OPERATIVE RIGHT HIP (WITH PELVIS IF PERFORMED) 3 VIEWS TECHNIQUE: Fluoroscopic spot image(s) were submitted for interpretation post-operatively. COMPARISON:  07/06/2019 FLUOROSCOPY TIME:  0 minutes 12 seconds Dose: 3.15 mGy FINDINGS: Images demonstrate osseous demineralization. RIGHT hip prosthesis identified without fracture or dislocation. IMPRESSION: RIGHT hip prosthesis without acute fracture or dislocation. Electronically Signed   By: Lavonia Dana M.D.   On: 07/07/2019 14:25   Dg Hip Unilat  With Pelvis 2-3 Views Right  Result Date: 07/06/2019 CLINICAL DATA:  Right hip pain after fall today. EXAM: DG HIP (WITH OR WITHOUT PELVIS) 2-3V RIGHT COMPARISON:  None. FINDINGS: Moderately displaced fracture is seen involving the proximal right femoral neck. No other bony abnormality is noted. IMPRESSION: Moderately displaced proximal right femoral neck fracture. Electronically Signed   By: Marijo Conception M.D.   On: 07/06/2019 12:26     ASSESSMENT AND PLAN:   73 year old male with dementia who presented to the ER after mechanical fall and has suffered right hip fracture.  1.  Moderately displaced proximal right femoral neck fracture:  Patient is postoperative day #1 and doing well.  Management as per orthopedic surgery.  Patient will need skilled nursing  facility at  discharge.  Social worker assisting with discharge planning.   2.  Dementia: Continue Namenda and Seroquel 3.  Depression: Continue Zoloft Remeron and trazodone  4.  Essential hypertension on losartan  5.  Mild acute on chronic kidney disease stage III: Creatinine at baseline  Patient will be discharged to skilled nursing facility once insurance approval, possibly tomorrow.  Patient son would like to be contacted for discharge planning. Patient's wife reports that it is okay to contact son.   Management plans discussed with the patient's son and he is in agreement.  CODE STATUS: full  TOTAL TIME TAKING CARE OF THIS PATIENT: 30 minutes.     POSSIBLE D/C tomorrow, DEPENDING ON CLINICAL CONDITION.   Bettey Costa M.D on 07/08/2019 at 11:56 AM  Between 7am to 6pm - Pager - [phone] After 6pm go to [email or web address] - password [email or web address] [facility]  Office  [phone]  CC: Primary care physician; Jerrol Banana., MD  Note: This dictation was prepared with Dragon dictation along with smaller phrase technology. Any transcriptional errors that result from this process are unintentional.

## 2019-07-08 NOTE — NC FL2 (Signed)
[employer] LEVEL OF CARE SCREENING TOOL     IDENTIFICATION  Patient Name: Vincent Ayala. [name] Birthdate: August 15, 1946 Sex: male Admission Date (Current Location): 07/06/2019  [place] and [place] Number:  Engineering geologist and Address:  [facility], [address], [place], [place] [address]      Provider Number: [id number]  Attending Physician Name and Address:  Bettey Costa, MD  Relative Name and Phone Number:  Christepher Melchior son [phone]    Current Level of Care: Hospital Recommended Level of Care: [place] Prior Approval Number:    Date Approved/Denied:   PASRR Number: pending [place] must ID [id number]  Discharge Plan: SNF    Current Diagnoses: Patient Active Problem List   Diagnosis Date Noted  . Closed right hip fracture, initial encounter ([place]) 07/06/2019  . Dementia ([place]) 03/12/2016  . Depression 03/12/2016  . 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 Sleep apnea 03/12/2016  . 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 Hypertension 03/12/2016  . Allergic rhinitis 03/12/2016  . Osteoporosis 03/12/2016  . Polymyalgia rheumatica ([place]) 03/12/2016  . Cervical radiculopathy 03/12/2016    Orientation RESPIRATION BLADDER Height & Weight     Self, Place, Situation  Normal Continent Weight: 131.5 kg Height:  6\' 3"  (190.5 cm)  BEHAVIORAL SYMPTOMS/MOOD NEUROLOGICAL BOWEL NUTRITION STATUS      Continent Diet  AMBULATORY STATUS COMMUNICATION OF NEEDS Skin   Extensive Assist Verbally Normal, Surgical wounds                       Personal Care Assistance Level of Assistance  Dressing, Bathing Bathing Assistance: Limited assistance   Dressing Assistance: Limited assistance     Functional Limitations Info  Sight, Hearing, Speech Sight Info: Adequate Hearing Info: Adequate Speech Info: Adequate    SPECIAL CARE FACTORS FREQUENCY  PT (By licensed PT)     PT Frequency: 5 times per week               Contractures      Additional Factors Info  Code Status, Allergies Code Status Info: full Allergies Info: Penicillins           Current Medications (07/08/2019):  This is the current hospital active medication list Current Facility-Administered Medications  Medication Dose Route Frequency Provider Last Rate Last Dose  . 0.9 %  sodium chloride infusion   Intravenous Continuous Lovell Sheehan, MD 50 mL/hr at 07/06/19 1848    . acetaminophen (TYLENOL) tablet 325-650 mg  325-650 mg Oral Q6H PRN Lovell Sheehan, MD      . acetaminophen (TYLENOL) tablet 500 mg  500 mg Oral Q6H Lovell Sheehan, MD   500 mg at 07/08/19 1110  . calcium citrate-vitamin D 500-500 MG-UNIT per chewable tablet 1 tablet  1 tablet Oral BID Lovell Sheehan, MD   1 tablet at 07/08/19 [phone]  . docusate sodium (COLACE) capsule 100 mg  100 mg Oral BID Lovell Sheehan, MD   100 mg at 07/08/19 3875  . enoxaparin (LOVENOX) injection 40 mg  40 mg Subcutaneous Q24H Lovell Sheehan, MD   40 mg at 07/06/19 2056  . gabapentin (NEURONTIN) capsule 300 mg  300 mg Oral TID Lovell Sheehan, MD   300 mg at 07/08/19 0919  . HYDROcodone-acetaminophen (NORCO) 7.5-325 MG per tablet 1-2 tablet  1-2 tablet Oral Q4H PRN Lovell Sheehan, MD      . HYDROcodone-acetaminophen (NORCO/VICODIN) 5-325 MG per tablet  1-2 tablet  1-2 tablet Oral Q4H PRN Lovell Sheehan, MD      . ketorolac (TORADOL) 15 MG/ML injection 7.5 mg  7.5 mg Intravenous Q6H Lovell Sheehan, MD   7.5 mg at 07/08/19 1110  . lactated ringers infusion   Intravenous Continuous Lovell Sheehan, MD 75 mL/hr at 07/08/19 0013    . lamoTRIgine (LAMICTAL) tablet 100 mg  100 mg Oral BID Lovell Sheehan, MD   100 mg at 07/08/19 9485  . losartan (COZAAR) tablet 50 mg  50 mg Oral Daily Lovell Sheehan, MD      . MEDLINE mouth rinse  15 mL Mouth Rinse BID Lovell Sheehan, MD   15 mL at 07/07/19 2052  . Melatonin TABS 5 mg  5 mg Oral QHS Lovell Sheehan, MD   5 mg at 07/07/19 2052  . memantine  (NAMENDA) tablet 10 mg  10 mg Oral Daily Lovell Sheehan, MD   10 mg at 07/08/19 0919  . menthol-cetylpyridinium (CEPACOL) lozenge 3 mg  1 lozenge Oral PRN Lovell Sheehan, MD       Or  . phenol (CHLORASEPTIC) mouth spray 1 spray  1 spray Mouth/Throat PRN Lovell Sheehan, MD      . metoCLOPramide (REGLAN) tablet 5-10 mg  5-10 mg Oral Q8H PRN Lovell Sheehan, MD       Or  . metoCLOPramide (REGLAN) injection 5-10 mg  5-10 mg Intravenous Q8H PRN Lovell Sheehan, MD      . mirtazapine (REMERON) tablet 15 mg  15 mg Oral QHS Lovell Sheehan, MD   15 mg at 07/07/19 2051  . morphine 2 MG/ML injection 0.5-1 mg  0.5-1 mg Intravenous Q2H PRN Lovell Sheehan, MD   1 mg at 07/07/19 2048  . multivitamin with minerals tablet 1 tablet  1 tablet Oral Daily Lovell Sheehan, MD   1 tablet at 07/08/19 [phone]  . mupirocin ointment (BACTROBAN) 2 % 1 application  1 application Nasal BID Lovell Sheehan, MD   1 application at 03/50/09 [phone]  . ondansetron (ZOFRAN) tablet 4 mg  4 mg Oral Q6H PRN Lovell Sheehan, MD       Or  . ondansetron [facility]) injection 4 mg  4 mg Intravenous Q6H PRN Lovell Sheehan, MD      . QUEtiapine (SEROQUEL) tablet 25 mg  25 mg Oral BID Lovell Sheehan, MD   25 mg at 07/08/19 2993  . sertraline (ZOLOFT) tablet 150 mg  150 mg Oral Daily Lovell Sheehan, MD   150 mg at 07/08/19 7169  . traMADol (ULTRAM) tablet 50 mg  50 mg Oral Q6H Lovell Sheehan, MD   50 mg at 07/08/19 1110  . traZODone (DESYREL) tablet 50 mg  50 mg Oral QHS Lovell Sheehan, MD   50 mg at 07/07/19 2052     Discharge Medications: Please see discharge summary for a list of discharge medications.  Relevant Imaging Results:  Relevant Lab Results:   Additional Information [id number]  Su Hilt, RN

## 2019-07-09 DIAGNOSIS — Z7401 Bed confinement status: Secondary | ICD-10-CM | POA: Diagnosis not present

## 2019-07-09 DIAGNOSIS — S72001D Fracture of unspecified part of neck of right femur, subsequent encounter for closed fracture with routine healing: Secondary | ICD-10-CM | POA: Diagnosis not present

## 2019-07-09 DIAGNOSIS — S72001A Fracture of unspecified part of neck of right femur, initial encounter for closed fracture: Secondary | ICD-10-CM | POA: Diagnosis not present

## 2019-07-09 DIAGNOSIS — N390 Urinary tract infection, site not specified: Secondary | ICD-10-CM | POA: Diagnosis not present

## 2019-07-09 DIAGNOSIS — F0151 Vascular dementia with behavioral disturbance: Secondary | ICD-10-CM | POA: Diagnosis not present

## 2019-07-09 DIAGNOSIS — F3289 Other specified depressive episodes: Secondary | ICD-10-CM | POA: Diagnosis not present

## 2019-07-09 DIAGNOSIS — R319 Hematuria, unspecified: Secondary | ICD-10-CM | POA: Diagnosis not present

## 2019-07-09 DIAGNOSIS — E569 Vitamin deficiency, unspecified: Secondary | ICD-10-CM | POA: Diagnosis not present

## 2019-07-09 DIAGNOSIS — Z471 Aftercare following joint replacement surgery: Secondary | ICD-10-CM | POA: Diagnosis not present

## 2019-07-09 DIAGNOSIS — K5900 Constipation, unspecified: Secondary | ICD-10-CM | POA: Diagnosis not present

## 2019-07-09 DIAGNOSIS — F0390 Unspecified dementia without behavioral disturbance: Secondary | ICD-10-CM | POA: Diagnosis not present

## 2019-07-09 DIAGNOSIS — D649 Anemia, unspecified: Secondary | ICD-10-CM | POA: Diagnosis not present

## 2019-07-09 DIAGNOSIS — R5381 Other malaise: Secondary | ICD-10-CM | POA: Diagnosis not present

## 2019-07-09 DIAGNOSIS — R488 Other symbolic dysfunctions: Secondary | ICD-10-CM | POA: Diagnosis not present

## 2019-07-09 DIAGNOSIS — I10 Essential (primary) hypertension: Secondary | ICD-10-CM | POA: Diagnosis not present

## 2019-07-09 DIAGNOSIS — F329 Major depressive disorder, single episode, unspecified: Secondary | ICD-10-CM | POA: Diagnosis not present

## 2019-07-09 DIAGNOSIS — Z96641 Presence of right artificial hip joint: Secondary | ICD-10-CM | POA: Diagnosis not present

## 2019-07-09 DIAGNOSIS — M6281 Muscle weakness (generalized): Secondary | ICD-10-CM | POA: Diagnosis not present

## 2019-07-09 DIAGNOSIS — M2550 Pain in unspecified joint: Secondary | ICD-10-CM | POA: Diagnosis not present

## 2019-07-09 DIAGNOSIS — R262 Difficulty in walking, not elsewhere classified: Secondary | ICD-10-CM | POA: Diagnosis not present

## 2019-07-09 LAB — BASIC METABOLIC PANEL
Anion gap: 5 (ref 5–15)
BUN: 31 mg/dL — ABNORMAL HIGH (ref 8–23)
CO2: 25 mmol/L (ref 22–32)
Calcium: 8.2 mg/dL — ABNORMAL LOW (ref 8.9–10.3)
Chloride: 108 mmol/L (ref 98–111)
Creatinine, Ser: 1.36 mg/dL — ABNORMAL HIGH (ref 0.61–1.24)
GFR calc Af Amer: 59 mL/min — ABNORMAL LOW (ref >60)
GFR calc non Af Amer: 51 mL/min — ABNORMAL LOW (ref >60)
Glucose, Bld: 158 mg/dL — ABNORMAL HIGH (ref 70–99)
Potassium: 3.7 mmol/L (ref 3.5–5.1)
Sodium: 138 mmol/L (ref 135–145)

## 2019-07-09 LAB — SARS CORONAVIRUS 2 BY RT PCR (HOSPITAL ORDER, PERFORMED IN ~~LOC~~ HOSPITAL LAB): SARS Coronavirus 2: NEGATIVE

## 2019-07-09 MED ORDER — HYDROCODONE-ACETAMINOPHEN 5-325 MG PO TABS: 1.0000 | ORAL_TABLET | Freq: Four times a day (QID) | ORAL | 0 refills | Status: AC | PRN

## 2019-07-09 MED ORDER — GABAPENTIN 300 MG PO CAPS: 300.0000 mg | ORAL_CAPSULE | Freq: Three times a day (TID) | ORAL | Status: DC

## 2019-07-09 MED ORDER — BISACODYL 5 MG PO TBEC: 10.0000 mg | DELAYED_RELEASE_TABLET | Freq: Every day | ORAL | Status: DC | PRN

## 2019-07-09 MED ORDER — DOCUSATE SODIUM 100 MG PO CAPS: 100.0000 mg | ORAL_CAPSULE | Freq: Two times a day (BID) | ORAL | 0 refills | Status: DC

## 2019-07-09 MED ORDER — ENOXAPARIN SODIUM 40 MG/0.4ML ~~LOC~~ SOLN: 40.0000 mg | SUBCUTANEOUS | Status: DC

## 2019-07-09 MED ORDER — BISACODYL 5 MG PO TBEC: 10.0000 mg | DELAYED_RELEASE_TABLET | Freq: Every day | ORAL | 0 refills | Status: DC | PRN

## 2019-07-09 NOTE — Progress Notes (Signed)
Patient is being discharged this afternoon and report was called to Haematologist at [facility]. Rx & DC instructions printed for receiving RN. IV removed. New honeycomb applied. Patient had bm. Belongings packed.

## 2019-07-09 NOTE — Progress Notes (Signed)
Physical Therapy Treatment Patient Details Name: Vincent Ayala MRN: [id number] DOB: 1946/03/05 Today's Date: 07/09/2019    History of Present Illness 73 y/o male s/p fall with R hip fracture, s/p hemiarthroplasty 07/07/19, h/o dementia.     PT Comments    Pt in recliner for 3 hours today.  Starting to slide down in chair.  Stood with min a x 2 to walker and was able to transfer to bed and return to supine with same assist.  He continues to rely heavily on walker with forward lean but generally tolerates mobility well.  Continues to need +2 assist for safety. Awaiting transfer to SNF today.   Follow Up Recommendations  SNF     Equipment Recommendations  Rolling walker with 5" wheels    Recommendations for Other Services       Precautions / Restrictions Precautions Precautions: Anterior Hip;Fall Precaution Booklet Issued: No Restrictions Weight Bearing Restrictions: Yes RLE Weight Bearing: Weight bearing as tolerated    Mobility  Bed Mobility Overal bed mobility: Needs Assistance Bed Mobility: Sit to Supine     Supine to sit: Mod assist Sit to supine: Mod assist   General bed mobility comments: +2 to scoot up in bed  Transfers Overall transfer level: Needs assistance Equipment used: Rolling walker (2 wheeled) Transfers: Sit to/from Stand Sit to Stand: Min assist;+2 physical assistance;+2 safety/equipment;Mod assist            Ambulation/Gait Ambulation/Gait assistance: Min assist;+2 physical assistance;+2 safety/equipment;Mod assist Gait Distance (Feet): 2 Feet Assistive device: Rolling walker (2 wheeled) Gait Pattern/deviations: Step-to pattern;Decreased step length - left;Decreased stance time - right;Decreased step length - right Gait velocity: decreased   General Gait Details: some increased assist today due to cognition.  Some increased right lean today but less buckling noted at knees.   Stairs             Wheelchair Mobility    Modified  Rankin (Stroke Patients Only)       Balance Overall balance assessment: Needs assistance Sitting-balance support: Feet supported Sitting balance-Leahy Scale: Fair     Standing balance support: Bilateral upper extremity supported Standing balance-Leahy Scale: Poor Standing balance comment: heavy reliance on walker generally steady in standing but knees with slight buckle                            Cognition Arousal/Alertness: Awake/alert Behavior During Therapy: WFL for tasks assessed/performed Overall Cognitive Status: History of cognitive impairments - at baseline                                        Exercises Total Joint Exercises Heel Slides: AAROM;10 reps;Supine Hip ABduction/ADduction: 10 reps;AROM;AAROM;Supine Long Arc Quad: AROM;10 reps;Seated    General Comments        Pertinent Vitals/Pain Pain Assessment: Faces Faces Pain Scale: Hurts a little bit Pain Location: Generally comfortable with good pain control. Pain Descriptors / Indicators: Operative site guarding;Sore Pain Intervention(s): Limited activity within patient's tolerance;Monitored during session    Home Living                      Prior Function            PT Goals (current goals can now be found in the care plan section) Progress towards PT goals: Progressing toward goals  Frequency    BID      PT Plan Current plan remains appropriate    Co-evaluation              AM-PAC PT "6 Clicks" Mobility   Outcome Measure  Help needed turning from your back to your side while in a flat bed without using bedrails?: A Lot Help needed moving from lying on your back to sitting on the side of a flat bed without using bedrails?: A Lot Help needed moving to and from a bed to a chair (including a wheelchair)?: A Lot Help needed standing up from a chair using your arms (e.g., wheelchair or bedside chair)?: A Lot Help needed to walk in hospital room?: A  Lot Help needed climbing 3-5 steps with a railing? : Total 6 Click Score: 11    End of Session Equipment Utilized During Treatment: Gait belt Activity Tolerance: Patient tolerated treatment well Patient left: in bed;with call bell/phone within reach;with bed alarm set;with family/visitor present         Time: 4481-8563 PT Time Calculation (min) (ACUTE ONLY): 11 min  Charges:  $Therapeutic Exercise: 8-22 mins $Therapeutic Activity: 8-22 mins                     Chesley Noon, PTA 07/09/19, 1:06 PM

## 2019-07-09 NOTE — Progress Notes (Signed)
  Subjective:  Patient reports pain as mild.  Resting comfortably.  Objective:   VITALS:   Vitals:   07/08/19 0802 07/08/19 1518 07/08/19 2336 07/09/19 0817  BP: 110/61 120/63 131/62 118/61  Pulse: 76 70 81 85  Resp: 17 17 19    Temp: 98.2 F (36.8 C) 98.3 F (36.8 C) 98.2 F (36.8 C) 98.7 F (37.1 C)  TempSrc: Oral Axillary Oral Oral  SpO2: 100% 98% 96% 96%  Weight:      Height:        PHYSICAL EXAM:  Neurologically intact ABD soft Neurovascular intact Sensation intact distally Intact pulses distally Dorsiflexion/Plantar flexion intact Incision: dressing C/D/I No cellulitis present Compartment soft  LABS  Results for orders placed or performed during the hospital encounter of 07/06/19 (from the past 24 hour(s))  Basic metabolic panel     Status: Abnormal   Collection Time: 07/09/19  5:16 AM  Result Value Ref Range   Sodium 138 135 - 145 mmol/L   Potassium 3.7 3.5 - 5.1 mmol/L   Chloride 108 98 - 111 mmol/L   CO2 25 22 - 32 mmol/L   Glucose, Bld 158 (H) 70 - 99 mg/dL   BUN 31 (H) 8 - 23 mg/dL   Creatinine, Ser 1.36 (H) 0.61 - 1.24 mg/dL   Calcium 8.2 (L) 8.9 - 10.3 mg/dL   GFR calc non Af Amer 51 (L) >60 mL/min   GFR calc Af Amer 59 (L) >60 mL/min   Anion gap 5 5 - 15    Dg Hip Operative Unilat W Or W/o Pelvis Right  Result Date: 07/07/2019 CLINICAL DATA:  RIGHT total hip replacement EXAM: OPERATIVE RIGHT HIP (WITH PELVIS IF PERFORMED) 3 VIEWS TECHNIQUE: Fluoroscopic spot image(s) were submitted for interpretation post-operatively. COMPARISON:  07/06/2019 FLUOROSCOPY TIME:  0 minutes 12 seconds Dose: 3.15 mGy FINDINGS: Images demonstrate osseous demineralization. RIGHT hip prosthesis identified without fracture or dislocation. IMPRESSION: RIGHT hip prosthesis without acute fracture or dislocation. Electronically Signed   By: Lavonia Dana M.D.   On: 07/07/2019 14:25    Assessment/Plan: 2 Days Post-Op   Active Problems:   Closed right hip fracture, initial  encounter ([place])   Up with therapy  Patient remained stable postop.  He is doing well from orthopedic standpoint.  Patient is weightbearing as tolerated on the right lower extremity.  He should continue to observe posterior hip precautions.  Continue to use hip abduction pillow while in bed.  Patient may be discharged to skilled nursing facility from an orthopedic standpoint when cleared by medicine.  Patient will follow-up with Dr. Harlow Mares in the office in approximately 2 weeks after discharge. [address] for DVT prophylaxis x 4 weeks.    Carlynn Spry , PA-C 07/09/2019, 10:38 AM

## 2019-07-09 NOTE — Progress Notes (Signed)
EMS here to transport patient

## 2019-07-09 NOTE — Progress Notes (Signed)
Physical Therapy Treatment Patient Details Name: Vincent Ayala. [place] MRN: [id number] DOB: [birth year] Today's Date: 07/09/2019    History of Present Illness 73 y/o male s/p fall with R hip fracture, s/p hemiarthroplasty 07/07/19, h/o dementia.     PT Comments    Participated in exercises as described below.  To edge of bed with mod a x 1.  Some post imbalances today with sitting but able to self correct with verbal cues.  Stood and transferred to recliner at bedside with min/mod a x +2 .  Some increased right lean today but less B knee buckling.  Pt was not as motivated to get to recliner today as he was yesterday which may have been primary reason for increased assist today. Wife in room and discussed SNF expectations and typical progression of mobility.    Follow Up Recommendations  SNF     Equipment Recommendations  Rolling walker with 5" wheels    Recommendations for Other Services       Precautions / Restrictions Precautions Precautions: Anterior Hip;Fall Precaution Booklet Issued: No Restrictions Weight Bearing Restrictions: Yes RLE Weight Bearing: Weight bearing as tolerated    Mobility  Bed Mobility Overal bed mobility: Needs Assistance Bed Mobility: Sit to Supine     Supine to sit: Mod assist        Transfers   Equipment used: Rolling walker (2 wheeled) Transfers: Sit to/from Stand Sit to Stand: Min assist;+2 physical assistance;+2 safety/equipment;Mod assist            Ambulation/Gait Ambulation/Gait assistance: Min assist;+2 physical assistance;+2 safety/equipment;Mod assist Gait Distance (Feet): 2 Feet Assistive device: Rolling walker (2 wheeled) Gait Pattern/deviations: Step-to pattern;Decreased step length - left;Decreased stance time - right;Decreased step length - right Gait velocity: decreased   General Gait Details: some increased assist today due to cognition.  Some increased right lean today but less buckling noted at knees.   Stairs             Wheelchair Mobility    Modified Rankin (Stroke Patients Only)       Balance Overall balance assessment: Needs assistance Sitting-balance support: Feet supported Sitting balance-Leahy Scale: Fair     Standing balance support: Bilateral upper extremity supported Standing balance-Leahy Scale: Poor Standing balance comment: heavy reliance on walker generally steady in standing but knees with slight buckle                            Cognition Arousal/Alertness: Awake/alert Behavior During Therapy: WFL for tasks assessed/performed Overall Cognitive Status: History of cognitive impairments - at baseline                                        Exercises Total Joint Exercises Heel Slides: AAROM;10 reps;Supine Hip ABduction/ADduction: 10 reps;AROM;AAROM;Supine Long Arc Quad: AROM;10 reps;Seated    General Comments        Pertinent Vitals/Pain Pain Assessment: Faces Faces Pain Scale: Hurts a little bit Pain Location: Generally comfortable with good pain control. Pain Descriptors / Indicators: Operative site guarding;Sore Pain Intervention(s): Limited activity within patient's tolerance;Monitored during session;Repositioned    Home Living                      Prior Function            PT Goals (current goals can now be found in the  care plan section) Progress towards PT goals: Progressing toward goals    Frequency    BID      PT Plan Current plan remains appropriate    Co-evaluation              AM-PAC PT "6 Clicks" Mobility   Outcome Measure  Help needed turning from your back to your side while in a flat bed without using bedrails?: A Lot Help needed moving from lying on your back to sitting on the side of a flat bed without using bedrails?: A Lot Help needed moving to and from a bed to a chair (including a wheelchair)?: A Lot Help needed standing up from a chair using your arms (e.g., wheelchair or  bedside chair)?: A Lot Help needed to walk in hospital room?: A Lot Help needed climbing 3-5 steps with a railing? : Total 6 Click Score: 11    End of Session Equipment Utilized During Treatment: Gait belt Activity Tolerance: Patient tolerated treatment well Patient left: in chair;with call bell/phone within reach;with chair alarm set;with family/visitor present         Time: 7510-2585 PT Time Calculation (min) (ACUTE ONLY): 12 min  Charges:  $Therapeutic Exercise: 8-22 mins                    Chesley Noon, PTA 07/09/19, 11:25 AM

## 2019-07-09 NOTE — TOC Transition Note (Signed)
Transition of Care [facility]) - CM/SW Discharge Note   Patient Details  Name: Vincent Ayala. [place] MRN: [id number] Date of Birth: Mar 21, 1946  Transition of Care [facility]) CM/SW Contact:  Weston Anna, LCSW Phone Number: 07/09/2019, 2:28 PM   Clinical Narrative:     CSW spoke with patients son, [name], via phone to discuss discharge to [facility]. Insurance authorization has been received and facility is prepared for patient. DC summary has been faxed. Please call report to [phone]. Patient will need transportation via EMS.   CSW spoke with Otila Kluver from [facility] and was informed that patient could be discharge to facility without pasrr number being received (it has been started with [facility] MUST).        Patient Goals and CMS Choice        Discharge Placement                       Discharge Plan and Services                                     Social Determinants of Health (SDOH) Interventions     Readmission Risk Interventions No flowsheet data found.

## 2019-07-09 NOTE — Discharge Summary (Signed)
[facility] [place] NAME: Vincent Ayala    MR#:  [id number]  DATE OF BIRTH:  [birth year]  DATE OF ADMISSION:  07/06/2019   ADMITTING PHYSICIAN: Lang Snow, NP  DATE OF DISCHARGE: 07/09/2019  PRIMARY CARE PHYSICIAN: Jerrol Banana., MD   ADMISSION DIAGNOSIS:  Advanced dementia [facility]) [F03.90] Closed fracture of neck of right femur, initial encounter ([place]) [S72.001A] DISCHARGE DIAGNOSIS:  Active Problems:   Closed right hip fracture, initial encounter ([place])  SECONDARY DIAGNOSIS:   Past Medical History:  Diagnosis Date  . 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 Depression   . 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 Hypertension    HOSPITAL COURSE:  73 year old male with dementia who presented to the ER after mechanical fall and has suffered right hip fracture.  1.  Moderately displaced proximal right femoral neck fracture:  Patient is postoperative day #2 Pain control and DVT prophylaxis with Lovenox for 4 weeks per orthopedic PA. PT and OT.  2.  Dementia: Continue Namenda and Seroquel 3.  Depression: Continue Zoloft Remeron and trazodone  4.  Essential hypertension on losartan  5.  Mild acute on chronic kidney disease stage III: Creatinine at baseline DISCHARGE CONDITIONS:  Stable, discharge to skilled nursing facility today. CONSULTS OBTAINED:   DRUG ALLERGIES:   Allergies  Allergen Reactions  . Penicillins    DISCHARGE MEDICATIONS:   Allergies as of 07/09/2019      Reactions   Penicillins       Medication List    TAKE these medications   bisacodyl 5 MG EC tablet Commonly known as: DULCOLAX Take 2 tablets (10 mg total) by mouth daily as needed for moderate constipation.   calcium-vitamin D 250-100 MG-UNIT tablet Take 1 tablet by mouth 2 (two) times daily.   docusate sodium 100 MG capsule Commonly known as: COLACE Take 1 capsule (100 mg total)  by mouth 2 (two) times daily.   enoxaparin 40 MG/0.4ML injection Commonly known as: LOVENOX Inject 0.4 mLs (40 mg total) into the skin daily for 28 days.   gabapentin 300 MG capsule Commonly known as: NEURONTIN Take 1 capsule (300 mg total) by mouth 3 (three) times daily.   HYDROcodone-acetaminophen 5-325 MG tablet Commonly known as: NORCO/VICODIN Take 1-2 tablets by mouth every 6 (six) hours as needed for up to 3 days for moderate pain or severe pain (pain score 4-6).   lamoTRIgine 100 MG tablet Commonly known as: LAMICTAL Take 100 mg by mouth 2 (two) times daily.   losartan 50 MG tablet Commonly known as: COZAAR TAKE ONE TABLET EVERY DAY   Melatonin 3 MG Tabs Take 6 mg by mouth at bedtime.   memantine 10 MG tablet Commonly known as: NAMENDA Take 10 mg by mouth daily.   mirtazapine 15 MG tablet Commonly known as: REMERON Take 1 tablet (15 mg total) by mouth at bedtime.   multivitamin tablet Take 1 tablet by mouth daily.   QUEtiapine 50 MG tablet Commonly known as: SEROQUEL Take 50 mg by mouth 2 (two) times a day.   sertraline 100 MG tablet Commonly known as: ZOLOFT TAKE 1 AND 1/2 TABLET EVERY DAY What changed: See the new instructions.   traZODone 50 MG tablet Commonly known as: DESYREL Take 50 mg by mouth at bedtime.        DISCHARGE INSTRUCTIONS:  See AVS.  If you experience worsening  of your admission symptoms, develop shortness of breath, life threatening emergency, suicidal or homicidal thoughts you must seek medical attention immediately by calling 911 or calling your MD immediately  if symptoms less severe.  You Must read complete instructions/literature along with all the possible adverse reactions/side effects for all the Medicines you take and that have been prescribed to you. Take any new Medicines after you have completely understood and accpet all the possible adverse reactions/side effects.   Please note  You were cared for by a hospitalist  during your hospital stay. If you have any questions about your discharge medications or the care you received while you were in the hospital after you are discharged, you can call the unit and asked to speak with the hospitalist on call if the hospitalist that took care of you is not available. Once you are discharged, your primary care physician will handle any further medical issues. Please note that NO REFILLS for any discharge medications will be authorized once you are discharged, as it is imperative that you return to your primary care physician (or establish a relationship with a primary care physician if you do not have one) for your aftercare needs so that they can reassess your need for medications and monitor your lab values.    On the day of Discharge:  VITAL SIGNS:  Blood pressure 118/61, pulse 85, temperature 98.7 F (37.1 C), temperature source Oral, resp. rate 19, height 6\' 3"  (1.905 m), weight 131.5 kg, SpO2 96 %. PHYSICAL EXAMINATION:  GENERAL:  73 y.o.-year-old patient lying in the bed with no acute distress.  EYES: Pupils equal, round, reactive to light and accommodation. No scleral icterus. Extraocular muscles intact.  HEENT: Head atraumatic, normocephalic.   NECK:  Supple, no jugular venous distention. No thyroid enlargement, no tenderness.  LUNGS: Normal breath sounds bilaterally, no wheezing, rales,rhonchi or crepitation. No use of accessory muscles of respiration.  CARDIOVASCULAR: S1, S2 normal. No murmurs, rubs, or gallops.  ABDOMEN: Soft, non-tender, non-distended. Bowel sounds present. No organomegaly or mass.  EXTREMITIES: No pedal edema, cyanosis, or clubbing.  NEUROLOGIC: Cranial nerves II through XII are intact. Muscle strength 4/5 in all extremities. Sensation intact. Gait not checked.  PSYCHIATRIC: The patient is alert and oriented x 2.  SKIN: No obvious rash, lesion, or ulcer.  DATA REVIEW:   CBC Recent Labs  Lab 07/08/19 0412  WBC 10.2  HGB 11.2*  HCT  34.2*  PLT 225    Chemistries  Recent Labs  Lab 07/09/19 0516  NA 138  K 3.7  CL 108  CO2 25  GLUCOSE 158*  BUN 31*  CREATININE 1.36*  CALCIUM 8.2*     Microbiology Results  Results for orders placed or performed during the hospital encounter of 07/06/19  SARS Coronavirus 2 (CEPHEID - Performed in [facility] hospital lab), Hosp Order     Status: None   Collection Time: 07/06/19 12:34 PM   Specimen: Nasopharyngeal Swab  Result Value Ref Range Status   SARS Coronavirus 2 NEGATIVE NEGATIVE Final    Comment: (NOTE) If result is NEGATIVE SARS-CoV-2 target nucleic acids are NOT DETECTED. The SARS-CoV-2 RNA is generally detectable in upper and lower  respiratory specimens during the acute phase of infection. The lowest  concentration of SARS-CoV-2 viral copies this assay can detect is 250  copies / mL. A negative result does not preclude SARS-CoV-2 infection  and should not be used as the sole basis for treatment or other  patient management decisions.  A negative result may occur with  improper specimen collection / handling, submission of specimen other  than nasopharyngeal swab, presence of viral mutation(s) within the  areas targeted by this assay, and inadequate number of viral copies  (<250 copies / mL). A negative result must be combined with clinical  observations, patient history, and epidemiological information. If result is POSITIVE SARS-CoV-2 target nucleic acids are DETECTED. The SARS-CoV-2 RNA is generally detectable in upper and lower  respiratory specimens dur ing the acute phase of infection.  Positive  results are indicative of active infection with SARS-CoV-2.  Clinical  correlation with patient history and other diagnostic information is  necessary to determine patient infection status.  Positive results do  not rule out bacterial infection or co-infection with other viruses. If result is PRESUMPTIVE POSTIVE SARS-CoV-2 nucleic acids MAY BE PRESENT.   A  presumptive positive result was obtained on the submitted specimen  and confirmed on repeat testing.  While 2019 novel coronavirus  (SARS-CoV-2) nucleic acids may be present in the submitted sample  additional confirmatory testing may be necessary for epidemiological  and / or clinical management purposes  to differentiate between  SARS-CoV-2 and other Sarbecovirus currently known to infect humans.  If clinically indicated additional testing with an alternate test  methodology [phone]) is advised. The SARS-CoV-2 RNA is generally  detectable in upper and lower respiratory sp ecimens during the acute  phase of infection. The expected result is Negative. Fact Sheet for Patients:  StrictlyIdeas.no Fact Sheet for Healthcare Providers: BankingDealers.co.za This test is not yet approved or cleared by the [place] FDA and has been authorized for detection and/or diagnosis of SARS-CoV-2 by FDA under an Emergency Use Authorization (EUA).  This EUA will remain in effect (meaning this test can be used) for the duration of the COVID-19 declaration under Section 564(b)(1) of the Act, 21 U.S.C. section 360bbb-3(b)(1), unless the authorization is terminated or revoked sooner. Performed at [facility], [address], [place], [place] [address]   Surgical PCR screen     Status: None   Collection Time: 07/06/19  8:31 PM   Specimen: Nasal Mucosa; Nasal Swab  Result Value Ref Range Status   MRSA, PCR NEGATIVE NEGATIVE Final   Staphylococcus aureus NEGATIVE NEGATIVE Final    Comment: (NOTE) The Xpert SA Assay (FDA approved for NASAL specimens in patients 44 years of age and older), is one component of a comprehensive surveillance program. It is not intended to diagnose infection nor to guide or monitor treatment. Performed at [facility], [place], [address] [place],  [address]     RADIOLOGY:  No results found.    Management plans discussed with the patient, his son and wife and they are in agreement.  CODE STATUS: Full Code   TOTAL TIME TAKING CARE OF THIS PATIENT: 36 minutes.    Demetrios Loll M.D on 07/09/2019 at 11:55 AM  Between 7am to 6pm - Pager - [phone]  After 6pm go to [email or web address] - password [email or web address] [facility]  [facility]  Office  [phone]  CC: Primary care physician; Jerrol Banana., MD   Note: This dictation was prepared with Dragon dictation along with smaller phrase technology. Any transcriptional errors that result from this process are unintentional.

## 2019-07-11 LAB — SURGICAL PATHOLOGY

## 2019-07-12 DIAGNOSIS — F329 Major depressive disorder, single episode, unspecified: Secondary | ICD-10-CM | POA: Diagnosis not present

## 2019-07-12 DIAGNOSIS — I10 Essential (primary) hypertension: Secondary | ICD-10-CM | POA: Diagnosis not present

## 2019-07-12 DIAGNOSIS — F0390 Unspecified dementia without behavioral disturbance: Secondary | ICD-10-CM | POA: Diagnosis not present

## 2019-07-12 DIAGNOSIS — S72001D Fracture of unspecified part of neck of right femur, subsequent encounter for closed fracture with routine healing: Secondary | ICD-10-CM | POA: Diagnosis not present

## 2019-07-19 DIAGNOSIS — Z96641 Presence of right artificial hip joint: Secondary | ICD-10-CM | POA: Diagnosis not present

## 2019-07-21 DIAGNOSIS — F0390 Unspecified dementia without behavioral disturbance: Secondary | ICD-10-CM | POA: Diagnosis not present

## 2019-07-21 DIAGNOSIS — I10 Essential (primary) hypertension: Secondary | ICD-10-CM | POA: Diagnosis not present

## 2019-07-21 DIAGNOSIS — K5900 Constipation, unspecified: Secondary | ICD-10-CM | POA: Diagnosis not present

## 2019-07-21 DIAGNOSIS — S72001D Fracture of unspecified part of neck of right femur, subsequent encounter for closed fracture with routine healing: Secondary | ICD-10-CM | POA: Diagnosis not present

## 2019-07-21 DIAGNOSIS — F329 Major depressive disorder, single episode, unspecified: Secondary | ICD-10-CM | POA: Diagnosis not present

## 2019-07-26 NOTE — Anesthesia Postprocedure Evaluation (Signed)
Anesthesia Post Note  Patient: Vincent Ayala. [name]  Procedure(s) Performed: ARTHROPLASTY BIPOLAR HIP (HEMIARTHROPLASTY) (Right Hip)  Patient location during evaluation: PACU Anesthesia Type: General Level of consciousness: awake and alert Pain management: pain level controlled Vital Signs Assessment: post-procedure vital signs reviewed and stable Respiratory status: spontaneous breathing, nonlabored ventilation, respiratory function stable and patient connected to nasal cannula oxygen Cardiovascular status: blood pressure returned to baseline and stable Postop Assessment: no apparent nausea or vomiting Anesthetic complications: no     Last Vitals:  Vitals:   07/09/19 0817 07/09/19 1743  BP: 118/61 137/80  Pulse: 85 83  Resp:    Temp: 37.1 C   SpO2: 96% 95%    Last Pain:  Vitals:   07/09/19 0817  TempSrc: Oral  PainSc:                  Molli Barrows

## 2019-07-28 ENCOUNTER — Ambulatory Visit: Payer: Self-pay | Admitting: Family Medicine

## 2019-07-31 DIAGNOSIS — F0390 Unspecified dementia without behavioral disturbance: Secondary | ICD-10-CM | POA: Diagnosis not present

## 2019-07-31 DIAGNOSIS — I10 Essential (primary) hypertension: Secondary | ICD-10-CM | POA: Diagnosis not present

## 2019-07-31 DIAGNOSIS — F329 Major depressive disorder, single episode, unspecified: Secondary | ICD-10-CM | POA: Diagnosis not present

## 2019-07-31 DIAGNOSIS — E569 Vitamin deficiency, unspecified: Secondary | ICD-10-CM | POA: Diagnosis not present

## 2019-07-31 DIAGNOSIS — S72001D Fracture of unspecified part of neck of right femur, subsequent encounter for closed fracture with routine healing: Secondary | ICD-10-CM | POA: Diagnosis not present

## 2019-07-31 DIAGNOSIS — Z471 Aftercare following joint replacement surgery: Secondary | ICD-10-CM | POA: Diagnosis not present

## 2019-07-31 DIAGNOSIS — M1990 Unspecified osteoarthritis, unspecified site: Secondary | ICD-10-CM | POA: Diagnosis not present

## 2019-07-31 DIAGNOSIS — G4700 Insomnia, unspecified: Secondary | ICD-10-CM | POA: Diagnosis not present

## 2019-08-01 ENCOUNTER — Telehealth: Payer: Self-pay | Admitting: Family Medicine

## 2019-08-01 NOTE — Telephone Encounter (Signed)
Stephanie with [facility] called to ask for OT for 2 times a week for 3 weeks 1 time a week for 1 week.    Thanks  [employer]

## 2019-08-02 ENCOUNTER — Telehealth: Payer: Self-pay | Admitting: Family Medicine

## 2019-08-02 NOTE — Telephone Encounter (Signed)
Please advise 

## 2019-08-02 NOTE — Telephone Encounter (Signed)
Noted  

## 2019-08-02 NOTE — Telephone Encounter (Signed)
FYI:  Called pt's son Lyfe Monger to let him know Dr. Rosanna Randy advises pt to go back to normal meds moving forward.  Thanks, [employer]

## 2019-08-02 NOTE — Telephone Encounter (Signed)
ok 

## 2019-08-03 NOTE — Telephone Encounter (Signed)
Advised stephanie as below.

## 2019-08-09 ENCOUNTER — Telehealth: Payer: Self-pay | Admitting: Family Medicine

## 2019-08-09 DIAGNOSIS — G4700 Insomnia, unspecified: Secondary | ICD-10-CM | POA: Diagnosis not present

## 2019-08-09 DIAGNOSIS — M1990 Unspecified osteoarthritis, unspecified site: Secondary | ICD-10-CM | POA: Diagnosis not present

## 2019-08-09 DIAGNOSIS — I10 Essential (primary) hypertension: Secondary | ICD-10-CM | POA: Diagnosis not present

## 2019-08-09 DIAGNOSIS — Z471 Aftercare following joint replacement surgery: Secondary | ICD-10-CM | POA: Diagnosis not present

## 2019-08-09 DIAGNOSIS — S72001D Fracture of unspecified part of neck of right femur, subsequent encounter for closed fracture with routine healing: Secondary | ICD-10-CM | POA: Diagnosis not present

## 2019-08-09 DIAGNOSIS — F0390 Unspecified dementia without behavioral disturbance: Secondary | ICD-10-CM | POA: Diagnosis not present

## 2019-08-09 DIAGNOSIS — R3989 Other symptoms and signs involving the genitourinary system: Secondary | ICD-10-CM

## 2019-08-09 DIAGNOSIS — E569 Vitamin deficiency, unspecified: Secondary | ICD-10-CM | POA: Diagnosis not present

## 2019-08-09 DIAGNOSIS — F329 Major depressive disorder, single episode, unspecified: Secondary | ICD-10-CM

## 2019-08-09 MED ORDER — SULFAMETHOXAZOLE-TRIMETHOPRIM 400-80 MG PO TABS: 1.0000 | ORAL_TABLET | Freq: Two times a day (BID) | ORAL | 0 refills | Status: DC

## 2019-08-09 NOTE — Telephone Encounter (Signed)
Received a verbal order from Dr. Rosanna Randy to send in the Bactrim 400-80 MG bid for 5 days instead of the Septra DS due to insurance not covering the medication.

## 2019-08-09 NOTE — Telephone Encounter (Signed)
Septra DS is not on patient preferred formulary list for insurance and the insurance is asking for Bactrim 400-80 MG to be send instead to be covered. Please advise.

## 2019-08-09 NOTE — Telephone Encounter (Signed)
Septra DS bid for 5 days.

## 2019-08-09 NOTE — Telephone Encounter (Signed)
Please advise 

## 2019-08-09 NOTE — Telephone Encounter (Signed)
Pt's son called saying he thinks his dad has a UTI and wants to get a prescription for an antiobiotic.  They have an appt Thursday to see Dr. Rosanna Randy for FU hip replacement. And son cant really get in until then  [facility]  CB#  [phone]  [place]

## 2019-08-11 ENCOUNTER — Inpatient Hospital Stay: Payer: PPO | Admitting: Family Medicine

## 2019-08-11 ENCOUNTER — Ambulatory Visit (INDEPENDENT_AMBULATORY_CARE_PROVIDER_SITE_OTHER): Payer: PPO | Admitting: Family Medicine

## 2019-08-11 ENCOUNTER — Other Ambulatory Visit: Payer: Self-pay

## 2019-08-11 ENCOUNTER — Encounter: Payer: Self-pay | Admitting: Family Medicine

## 2019-08-11 VITALS — BP 126/68 | HR 78 | Temp 98.3°F | Resp 20 | Ht 75.0 in | Wt 264.0 lb

## 2019-08-11 DIAGNOSIS — S72001A Fracture of unspecified part of neck of right femur, initial encounter for closed fracture: Secondary | ICD-10-CM

## 2019-08-11 DIAGNOSIS — H9193 Unspecified hearing loss, bilateral: Secondary | ICD-10-CM

## 2019-08-11 DIAGNOSIS — I10 Essential (primary) hypertension: Secondary | ICD-10-CM | POA: Diagnosis not present

## 2019-08-11 DIAGNOSIS — F0280 Dementia in other diseases classified elsewhere without behavioral disturbance: Secondary | ICD-10-CM | POA: Diagnosis not present

## 2019-08-11 DIAGNOSIS — G4733 Obstructive sleep apnea (adult) (pediatric): Secondary | ICD-10-CM | POA: Diagnosis not present

## 2019-08-11 DIAGNOSIS — G3109 Other frontotemporal dementia: Secondary | ICD-10-CM | POA: Diagnosis not present

## 2019-08-11 DIAGNOSIS — M8000XA Age-related osteoporosis with current pathological fracture, unspecified site, initial encounter for fracture: Secondary | ICD-10-CM

## 2019-08-11 NOTE — Patient Instructions (Signed)
Fax medication list to [phone].Marland KitchenMarland KitchenMarland Kitchen

## 2019-08-11 NOTE — Progress Notes (Signed)
Patient: Vincent Skemp. [place] Male    DOB: 1946/06/10   73 y.o.   MRN: [id number] Visit Date: 08/11/2019  Today's Provider: Wilhemena Durie, MD   Chief Complaint  Patient presents with  . Follow-up    recently discharged from [facility] on 07/21/2019.    Subjective:   HPI Patient comes in today accompanied with his son to discuss being placed in a assisted living facility. He was discharged from [facility] on 07/21/2019. Patient was hospitalized for a right femur fracture on 07/05/2014, and was discharged on [facility] He has not fallen since he has been home.  His wife is also developing MCI and children are looking at placement. His son said his meds have changed since discharge but these changes are not available today. son says that about 1 hour after morning meds he falls asleep.No physical complaints other than dementia.   BP Readings from Last 3 Encounters:  08/11/19 126/68  07/09/19 137/80  04/28/19 118/78   Wt Readings from Last 3 Encounters:  08/11/19 264 lb (119.7 kg)  07/06/19 290 lb (131.5 kg)  04/28/19 273 lb 9.6 oz (124.1 kg)     Allergies  Allergen Reactions  . Penicillins      Current Outpatient Medications:  .  bisacodyl (DULCOLAX) 5 MG EC tablet, Take 2 tablets (10 mg total) by mouth daily as needed for moderate constipation., Disp: 30 tablet, Rfl: 0 .  calcium-vitamin D 250-100 MG-UNIT tablet, Take 1 tablet by mouth 2 (two) times daily., Disp: , Rfl:  .  docusate sodium (COLACE) 100 MG capsule, Take 1 capsule (100 mg total) by mouth 2 (two) times daily., Disp: 10 capsule, Rfl: 0 .  gabapentin (NEURONTIN) 300 MG capsule, Take 1 capsule (300 mg total) by mouth 3 (three) times daily., Disp:  , Rfl:  .  lamoTRIgine (LAMICTAL) 100 MG tablet, Take 100 mg by mouth 2 (two) times daily., Disp: , Rfl:  .  losartan (COZAAR) 50 MG tablet, TAKE ONE TABLET EVERY DAY (Patient taking differently: Take 50 mg by mouth daily. ), Disp: 30 tablet, Rfl: 12  .  Melatonin 3 MG TABS, Take 6 mg by mouth at bedtime. , Disp: , Rfl:  .  memantine (NAMENDA) 10 MG tablet, Take 10 mg by mouth daily. , Disp: , Rfl:  .  Multiple Vitamin (MULTIVITAMIN) tablet, Take 1 tablet by mouth daily., Disp: , Rfl:  .  QUEtiapine (SEROQUEL) 50 MG tablet, Take 50 mg by mouth 2 (two) times a day., Disp: , Rfl:  .  sertraline (ZOLOFT) 100 MG tablet, TAKE 1 AND 1/2 TABLET EVERY DAY (Patient taking differently: Take 150 mg by mouth daily. ), Disp: 45 tablet, Rfl: 12 .  traZODone (DESYREL) 50 MG tablet, Take 50 mg by mouth at bedtime., Disp: , Rfl:  .  enoxaparin (LOVENOX) 40 MG/0.4ML injection, Inject 0.4 mLs (40 mg total) into the skin daily for 28 days., Disp: , Rfl:  .  mirtazapine (REMERON) 15 MG tablet, Take 1 tablet (15 mg total) by mouth at bedtime. (Patient not taking: Reported on 07/06/2019), Disp: 90 tablet, Rfl: 1 .  sulfamethoxazole-trimethoprim (BACTRIM) 400-80 MG tablet, Take 1 tablet by mouth 2 (two) times daily., Disp: 10 tablet, Rfl: 0  Review of Systems  Constitutional: Negative for activity change and fever.  Eyes: Negative.   Respiratory: Negative.   Cardiovascular: Negative for chest pain, palpitations and leg swelling.  Genitourinary: Negative.   Musculoskeletal: Positive for gait problem.  Negative for arthralgias, joint swelling, myalgias, neck pain and neck stiffness.  Neurological: Negative for dizziness, syncope, light-headedness and headaches.       Progreessive dementia.  Psychiatric/Behavioral: Positive for decreased concentration. Negative for agitation, behavioral problems, self-injury, sleep disturbance and suicidal ideas. The patient is not nervous/anxious and is not hyperactive.     Social History   Tobacco Use  . Smoking status: Never Smoker  . Smokeless tobacco: Never Used  Substance Use Topics  . Alcohol use: No      Objective:   BP 126/68   Pulse 78   Temp 98.3 F (36.8 C)   Resp 20   Ht 6\' 3"  (1.905 m)   Wt 264 lb (119.7  kg)   SpO2 97%   BMI 33.00 kg/m  Vitals:   08/11/19 1455  BP: 126/68  Pulse: 78  Resp: 20  Temp: 98.3 F (36.8 C)  SpO2: 97%  Weight: 264 lb (119.7 kg)  Height: 6\' 3"  (1.905 m)     Physical Exam Vitals signs reviewed.  Constitutional:      Appearance: He is well-developed.  HENT:     Head: Normocephalic and atraumatic.     Right Ear: External ear normal.     Left Ear: External ear normal.     Nose: Nose normal.  Eyes:     General:        Left eye: No discharge.     Conjunctiva/sclera: Conjunctivae normal.  Neck:     Thyroid: No thyromegaly.  Cardiovascular:     Rate and Rhythm: Normal rate and regular rhythm.     Heart sounds: Normal heart sounds.  Pulmonary:     Effort: Pulmonary effort is normal.     Breath sounds: Normal breath sounds.  Abdominal:     Palpations: Abdomen is soft.  Musculoskeletal:     Comments: Walking with a walker.  Skin:    General: Skin is warm and dry.  Neurological:     General: No focal deficit present.     Mental Status: He is alert.  Psychiatric:        Mood and Affect: Mood normal.        Behavior: Behavior normal.      No results found for any visits on 08/11/19.     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 Timing for Assisted living vs Memory care unit is appropriate especially as wife has MCI. Fininces are an issue concerning son.FL@ will be filled out .More than 50% of 50  minute visit spent in counseling or coordination of care CCM consult for SW for initiating help for social and financial issues.  2. Closed right hip fracture, initial encounter ([place]) With rehab..Walking with walker.  3. Age-related osteoporosis with current pathological fracture, initial encounter   4. Essential hypertension Consider stopping cozaar.  5. Obstructive sleep apnea syndrome   6. Decreased hearing of both ears     I have done the exam and reviewed the above chart and it is accurate to the best of  my knowledge. Development worker, community has been used in this note in any air is in the dictation or transcription are unintentional.  Wilhemena Durie, MD  [place]

## 2019-08-12 ENCOUNTER — Telehealth: Payer: Self-pay

## 2019-08-12 NOTE — Telephone Encounter (Signed)
I have not seen this come through yet.

## 2019-08-12 NOTE — Telephone Encounter (Signed)
Patient's son called in reference to a medication list he faxed over yesterday afternoon. He would like to know if you received it. [phone]

## 2019-08-12 NOTE — Telephone Encounter (Signed)
Patient requesting call back to discuss medication list for patient. KW

## 2019-08-13 MED ORDER — LAMOTRIGINE 100 MG PO TABS: 100.0000 mg | ORAL_TABLET | Freq: Two times a day (BID) | ORAL | 11 refills | Status: DC

## 2019-08-16 ENCOUNTER — Emergency Department: Payer: PPO

## 2019-08-16 ENCOUNTER — Other Ambulatory Visit: Payer: Self-pay

## 2019-08-16 DIAGNOSIS — S8991XA Unspecified injury of right lower leg, initial encounter: Secondary | ICD-10-CM | POA: Diagnosis not present

## 2019-08-16 DIAGNOSIS — W010XXA Fall on same level from slipping, tripping and stumbling without subsequent striking against object, initial encounter: Secondary | ICD-10-CM | POA: Diagnosis not present

## 2019-08-16 DIAGNOSIS — I10 Essential (primary) hypertension: Secondary | ICD-10-CM | POA: Diagnosis not present

## 2019-08-16 DIAGNOSIS — M25551 Pain in right hip: Secondary | ICD-10-CM | POA: Diagnosis not present

## 2019-08-16 DIAGNOSIS — M25561 Pain in right knee: Secondary | ICD-10-CM | POA: Diagnosis not present

## 2019-08-16 DIAGNOSIS — M7918 Myalgia, other site: Secondary | ICD-10-CM | POA: Diagnosis not present

## 2019-08-16 DIAGNOSIS — R0902 Hypoxemia: Secondary | ICD-10-CM | POA: Diagnosis not present

## 2019-08-16 DIAGNOSIS — R404 Transient alteration of awareness: Secondary | ICD-10-CM | POA: Diagnosis not present

## 2019-08-16 DIAGNOSIS — G3109 Other frontotemporal dementia: Secondary | ICD-10-CM | POA: Insufficient documentation

## 2019-08-16 DIAGNOSIS — Z96641 Presence of right artificial hip joint: Secondary | ICD-10-CM | POA: Insufficient documentation

## 2019-08-16 DIAGNOSIS — M353 Polymyalgia rheumatica: Secondary | ICD-10-CM | POA: Diagnosis not present

## 2019-08-16 DIAGNOSIS — R52 Pain, unspecified: Secondary | ICD-10-CM | POA: Diagnosis not present

## 2019-08-16 DIAGNOSIS — W19XXXA Unspecified fall, initial encounter: Secondary | ICD-10-CM | POA: Diagnosis not present

## 2019-08-16 DIAGNOSIS — Z85828 Personal history of other malignant neoplasm of skin: Secondary | ICD-10-CM | POA: Insufficient documentation

## 2019-08-16 NOTE — ED Triage Notes (Addendum)
Pt arrives to ED via [facility] from home s/p slip and fall. Per EMS, pt slipped on a wet, recently mopped floor in his kitchen PTA. No head injury; no reported LOC. EMS states pt is s/p right anterior hip replacement 6 weeks prior. No reported shortening, rotation, or bruising reported by EMS. Pt does have a h/x of dementia; son here to provide history. Pt does not take anticoagulants. Pt appears drowsy, but pt's son states he has already taken his PM medications. Pt denies any head, neck or back pain, but does report right knee pain (son states he was given a Cortisone shot today in the right knee by his PCP).

## 2019-08-16 NOTE — Telephone Encounter (Signed)
Dr. Rosanna Randy was advised of this on 08/12/19.

## 2019-08-17 ENCOUNTER — Emergency Department
Admission: EM | Admit: 2019-08-17 | Discharge: 2019-08-17 | Disposition: A | Discharge status: Home / Self Care | Payer: PPO | Attending: Emergency Medicine | Admitting: Emergency Medicine

## 2019-08-17 ENCOUNTER — Telehealth: Payer: Self-pay | Admitting: Family Medicine

## 2019-08-17 DIAGNOSIS — M1990 Unspecified osteoarthritis, unspecified site: Secondary | ICD-10-CM | POA: Diagnosis not present

## 2019-08-17 DIAGNOSIS — S72001D Fracture of unspecified part of neck of right femur, subsequent encounter for closed fracture with routine healing: Secondary | ICD-10-CM | POA: Diagnosis not present

## 2019-08-17 DIAGNOSIS — I10 Essential (primary) hypertension: Secondary | ICD-10-CM | POA: Diagnosis not present

## 2019-08-17 DIAGNOSIS — W19XXXA Unspecified fall, initial encounter: Secondary | ICD-10-CM

## 2019-08-17 DIAGNOSIS — Z471 Aftercare following joint replacement surgery: Secondary | ICD-10-CM | POA: Diagnosis not present

## 2019-08-17 DIAGNOSIS — F0390 Unspecified dementia without behavioral disturbance: Secondary | ICD-10-CM | POA: Diagnosis not present

## 2019-08-17 DIAGNOSIS — M7918 Myalgia, other site: Secondary | ICD-10-CM

## 2019-08-17 DIAGNOSIS — G4700 Insomnia, unspecified: Secondary | ICD-10-CM | POA: Diagnosis not present

## 2019-08-17 DIAGNOSIS — F329 Major depressive disorder, single episode, unspecified: Secondary | ICD-10-CM | POA: Diagnosis not present

## 2019-08-17 DIAGNOSIS — E569 Vitamin deficiency, unspecified: Secondary | ICD-10-CM | POA: Diagnosis not present

## 2019-08-17 NOTE — ED Notes (Signed)
Patient sleeping soundly in recliner in subwait.

## 2019-08-17 NOTE — ED Notes (Signed)
Updated family with patient on wait time.

## 2019-08-17 NOTE — Discharge Instructions (Signed)

## 2019-08-17 NOTE — ED Provider Notes (Signed)
[facility] Emergency Department Provider Note  ____________________________________________   First MD Initiated Contact with Patient 08/17/19 0327     (approximate)  I have reviewed the triage vital signs and the nursing notes.   HISTORY  Chief Complaint Fall  Level 5 caveat:  history/ROS limited by chronic dementia  HPI Vincent Ayala is a 73 y.o. male with advanced dementia who presents with his son at the bedside for evaluation after a fall.  He fell earlier tonight and did not lose consciousness or strike his head but he was unable to get up on his own because he just recently had right hip replacement surgery by Dr. Harlow Mares (about 6 weeks ago) and he also had injection of his right knee for chronic pain and arthritis earlier yesterday.  His son came to the house to help but was afraid of doing anything that would hurting further so they called EMS.  The patient is at his baseline mental status.  He has been waiting for 6 hours in the ED and has been able to bear weight and ambulate by himself and has been sleeping comfortably, moving his legs all around from side to side, and in no distress.         Past Medical History:  Diagnosis Date  . 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 Depression   . 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 Hypertension     Patient Active Problem List   Diagnosis Date Noted  . Closed right hip fracture, initial encounter ([place]) 07/06/2019  . Dementia ([place]) 03/12/2016  . Depression 03/12/2016  . Frontotemporal dementia ([place]) 03/12/2016  . Sleep apnea 03/12/2016  . 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 Hypertension 03/12/2016  . Allergic rhinitis 03/12/2016  . Osteoporosis 03/12/2016  . Polymyalgia rheumatica ([place]) 03/12/2016  . Cervical radiculopathy 03/12/2016    Past Surgical History:  Procedure Laterality Date  . HIP ARTHROPLASTY Right 07/07/2019   Procedure:  ARTHROPLASTY BIPOLAR HIP (HEMIARTHROPLASTY);  Surgeon: Lovell Sheehan, MD;  Location: [facility] ORS;  Service: Orthopedics;  Laterality: Right;  . 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 SPINE SURGERY     ruptured disc    Prior to Admission medications   Medication Sig Start Date End Date Taking? Authorizing Provider  bisacodyl (DULCOLAX) 5 MG EC tablet Take 2 tablets (10 mg total) by mouth daily as needed for moderate constipation. 07/09/19   Demetrios Loll, MD  calcium-vitamin D 250-100 MG-UNIT tablet Take 1 tablet by mouth 2 (two) times daily.    [provider]  docusate sodium (COLACE) 100 MG capsule Take 1 capsule (100 mg total) by mouth 2 (two) times daily. 07/09/19   Demetrios Loll, MD  enoxaparin (LOVENOX) 40 MG/0.4ML injection Inject 0.4 mLs (40 mg total) into the skin daily for 28 days. 07/09/19 08/06/19  Demetrios Loll, MD  gabapentin (NEURONTIN) 300 MG capsule Take 1 capsule (300 mg total) by mouth 3 (three) times daily. 07/09/19   Demetrios Loll, MD  lamoTRIgine (LAMICTAL) 100 MG tablet Take 1 tablet (100 mg total) by mouth 2 (two) times daily. 08/13/19   Jerrol Banana., MD  losartan (COZAAR) 50 MG tablet TAKE ONE TABLET EVERY DAY Patient taking differently: Take 50 mg by mouth daily.  02/05/19   Jerrol Banana., MD  Melatonin 3 MG  TABS Take 6 mg by mouth at bedtime.     [provider]  memantine (NAMENDA) 10 MG tablet Take 10 mg by mouth daily.     [provider]  mirtazapine (REMERON) 15 MG tablet Take 1 tablet (15 mg total) by mouth at bedtime. Patient not taking: Reported on 07/06/2019 12/17/16   Jerrol Banana., MD  Multiple Vitamin (MULTIVITAMIN) tablet Take 1 tablet by mouth daily.    [provider]  QUEtiapine (SEROQUEL) 50 MG tablet Take 50 mg by mouth 2 (two) times a day. 03/29/18   [provider]  sertraline (ZOLOFT) 100 MG tablet TAKE 1 AND 1/2 TABLET EVERY DAY Patient taking  differently: Take 150 mg by mouth daily.  02/05/19   Jerrol Banana., MD  sulfamethoxazole-trimethoprim (BACTRIM) 400-80 MG tablet Take 1 tablet by mouth 2 (two) times daily. 08/09/19   Jerrol Banana., MD  traZODone (DESYREL) 50 MG tablet Take 50 mg by mouth at bedtime.    [provider]    Allergies Penicillins  No family history on file.  Social History Social History   Tobacco Use  . Smoking status: Never Smoker  . Smokeless tobacco: Never Used  Substance Use Topics  . Alcohol use: No  . Drug use: No    Review of Systems Level 5 caveat:  history/ROS limited by chronic dementia.  Concern for pain in right hip after fall, now resolved.   ____________________________________________   PHYSICAL EXAM:  VITAL SIGNS: ED Triage Vitals  Enc Vitals Group     BP 08/16/19 2228 122/62     Pulse Rate 08/16/19 2228 95     Resp 08/16/19 2228 18     Temp 08/16/19 2228 99.2 F (37.3 C)     Temp Source 08/16/19 2228 Oral     SpO2 08/16/19 2219 95 %     Weight 08/16/19 2224 120.2 kg (265 lb)     Height 08/16/19 2224 1.905 m (6\' 3" )     Head Circumference --      [facility] --      Pain Score --      Pain Loc --      Pain Edu? --      Excl. in [place]? --     Constitutional: Alert and in no acute distress. Head: Atraumatic. Cardiovascular: Normal rate, regular rhythm. Good peripheral circulation. Respiratory: Normal respiratory effort.  No retractions. Musculoskeletal: No lower extremity tenderness nor edema. No gross deformities of extremities.  Patient is able to bear weight and have passive flexion and range of motion with no reproducible pain or tenderness, patient is moving his leg on his own without any issues.  No right knee effusion and no tenderness to palpation of the right knee or hip. Neurologic:  Normal speech and language. No gross focal neurologic deficits are appreciated.  Skin:  Skin is warm, dry and intact. Psychiatric: Mood and affect are at  his baseline for his chronic dementia. ____________________________________________   LABS (all labs ordered are listed, but only abnormal results are displayed)  Labs Reviewed - No data to display ____________________________________________  EKG  No indication for EKG ____________________________________________  RADIOLOGY I, Hinda Kehr, personally viewed and evaluated these images (plain radiographs) as part of my medical decision making, as well as reviewing the written report by the radiologist.  ED MD interpretation: No acute abnormalities identified on radiographs  Official radiology report(s): Dg Knee Complete 4 Views Right  Result Date: 08/16/2019 CLINICAL  DATA:  Slip on wet recently mopped floor in kitchen prior to arrival leading to fall. Right knee pain. EXAM: RIGHT KNEE - COMPLETE 4+ VIEW COMPARISON:  None. FINDINGS: No evidence of fracture, dislocation, or joint effusion. Mild medial compartment joint space narrowing. Chronic appearing ossification of the interosseous membrane in the proximal lower extremity, only partially included. Soft tissues are unremarkable. IMPRESSION: Mild medial osteoarthritis.  No acute osseous abnormality. Electronically Signed   By: Keith Rake M.D.   On: 08/16/2019 23:23   Dg Hip Unilat W Or Wo Pelvis 2-3 Views Right  Result Date: 08/16/2019 CLINICAL DATA:  Slip on wet recently mopped floor in the kitchen leading to fall. Right hip pain. Right hip arthroplasty 6 weeks ago. EXAM: DG HIP (WITH OR WITHOUT PELVIS) 2-3V RIGHT COMPARISON:  Preoperative radiograph 07/06/2019 FINDINGS: Right hip arthroplasty in expected alignment. No periprosthetic lucency or fracture. Pubic rami are intact without acute fracture. The pubic symphysis and sacroiliac joints are congruent. Degenerative change in the included lumbar spine. IMPRESSION: Right hip arthroplasty in expected alignment without complication. No acute fracture. Electronically Signed   By: Keith Rake M.D.   On: 08/16/2019 23:25    ____________________________________________   PROCEDURES   Procedure(s) performed (including Critical Care):  Procedures   ____________________________________________   INITIAL IMPRESSION / MDM / [place] / ED COURSE  As part of my medical decision making, I reviewed the following data within the [place] History obtained from family, Nursing notes reviewed and incorporated, Radiograph reviewed  and Notes from prior ED visits   Fortunately there is no indication of acute or emergent injury on radiographs.  The patient has been in the ED for about 6 hours and is moving his leg without any difficulty.  Son is reassured by the radiographs.  He is able to take the patient home and I encouraged outpatient follow-up as needed.  I gave my usual customary return precautions.  No indication for further evaluation or work-up.          ____________________________________________  FINAL CLINICAL IMPRESSION(S) / ED DIAGNOSES  Final diagnoses:  Fall, initial encounter  Musculoskeletal pain     MEDICATIONS GIVEN DURING THIS VISIT:  Medications - No data to display   ED Discharge Orders    None      *Please note:  Janine Ores was evaluated in Emergency Department on 08/17/2019 for the symptoms described in the history of present illness. He was evaluated in the context of the global COVID-19 pandemic, which necessitated consideration that the patient might be at risk for infection with the SARS-CoV-2 virus that causes COVID-19. Institutional protocols and algorithms that pertain to the evaluation of patients at risk for COVID-19 are in a state of rapid change based on information released by regulatory bodies including the CDC and federal and state organizations. These policies and algorithms were followed during the patient's care in the ED.  Some ED evaluations and interventions may be delayed as a result of  limited staffing during the pandemic.*  Note:  This document was prepared using Dragon voice recognition software and may include unintentional dictation errors.   Hinda Kehr, MD 08/17/19 [phone]

## 2019-08-17 NOTE — Telephone Encounter (Signed)
Pt's son calling regarding pt's FL2 Form for Assited Living Care Level.  Calling to check on the status.  Please call [name] back at [phone].  Thanks, [employer]

## 2019-08-17 NOTE — ED Notes (Signed)
Pt unable to sign for self and his son had already left to get the vehicle to take him home. Pts son educated on discharge instructions and verbalized understanding.

## 2019-08-18 NOTE — Telephone Encounter (Signed)
Please review. Thanks!  

## 2019-08-18 NOTE — Telephone Encounter (Signed)
I thought I had filled that out Friday--if not please get FL2 from [place] and will fill out. Also ,did we get Chrystal from [facility] to call him for SW consult.

## 2019-08-18 NOTE — Telephone Encounter (Signed)
Vincent Ayala is working on it.

## 2019-08-19 ENCOUNTER — Telehealth: Payer: Self-pay

## 2019-08-19 ENCOUNTER — Other Ambulatory Visit: Payer: Self-pay

## 2019-08-19 DIAGNOSIS — F39 Unspecified mood [affective] disorder: Secondary | ICD-10-CM | POA: Diagnosis not present

## 2019-08-19 DIAGNOSIS — G3109 Other frontotemporal dementia: Secondary | ICD-10-CM | POA: Diagnosis not present

## 2019-08-19 DIAGNOSIS — Z111 Encounter for screening for respiratory tuberculosis: Secondary | ICD-10-CM

## 2019-08-19 DIAGNOSIS — U071 COVID-19: Secondary | ICD-10-CM

## 2019-08-19 DIAGNOSIS — F0280 Dementia in other diseases classified elsewhere without behavioral disturbance: Secondary | ICD-10-CM | POA: Diagnosis not present

## 2019-08-19 DIAGNOSIS — Z79899 Other long term (current) drug therapy: Secondary | ICD-10-CM | POA: Diagnosis not present

## 2019-08-19 NOTE — Telephone Encounter (Signed)
Son called stating that his dad needs a TB test and Covid test done. Please advise

## 2019-08-19 NOTE — Telephone Encounter (Signed)
Please order both--Covid as per normal and TB is Quantiferon Gold blood test.

## 2019-08-19 NOTE — Telephone Encounter (Signed)
I have not seen this form and I am not working on this. Per Dr Rosanna Randy, he has already filled it out.

## 2019-08-24 DIAGNOSIS — Z79899 Other long term (current) drug therapy: Secondary | ICD-10-CM | POA: Diagnosis not present

## 2019-08-24 DIAGNOSIS — F329 Major depressive disorder, single episode, unspecified: Secondary | ICD-10-CM | POA: Diagnosis not present

## 2019-08-24 DIAGNOSIS — F0390 Unspecified dementia without behavioral disturbance: Secondary | ICD-10-CM | POA: Diagnosis not present

## 2019-08-24 DIAGNOSIS — E569 Vitamin deficiency, unspecified: Secondary | ICD-10-CM | POA: Diagnosis not present

## 2019-08-24 DIAGNOSIS — I10 Essential (primary) hypertension: Secondary | ICD-10-CM | POA: Diagnosis not present

## 2019-08-24 DIAGNOSIS — S72001D Fracture of unspecified part of neck of right femur, subsequent encounter for closed fracture with routine healing: Secondary | ICD-10-CM | POA: Diagnosis not present

## 2019-08-24 DIAGNOSIS — M1990 Unspecified osteoarthritis, unspecified site: Secondary | ICD-10-CM | POA: Diagnosis not present

## 2019-08-24 DIAGNOSIS — G4700 Insomnia, unspecified: Secondary | ICD-10-CM | POA: Diagnosis not present

## 2019-08-24 DIAGNOSIS — Z471 Aftercare following joint replacement surgery: Secondary | ICD-10-CM | POA: Diagnosis not present

## 2019-08-25 DIAGNOSIS — M1990 Unspecified osteoarthritis, unspecified site: Secondary | ICD-10-CM | POA: Diagnosis not present

## 2019-08-25 DIAGNOSIS — F0390 Unspecified dementia without behavioral disturbance: Secondary | ICD-10-CM | POA: Diagnosis not present

## 2019-08-25 DIAGNOSIS — I10 Essential (primary) hypertension: Secondary | ICD-10-CM | POA: Diagnosis not present

## 2019-08-25 DIAGNOSIS — E569 Vitamin deficiency, unspecified: Secondary | ICD-10-CM | POA: Diagnosis not present

## 2019-08-25 DIAGNOSIS — F329 Major depressive disorder, single episode, unspecified: Secondary | ICD-10-CM | POA: Diagnosis not present

## 2019-08-25 DIAGNOSIS — Z471 Aftercare following joint replacement surgery: Secondary | ICD-10-CM | POA: Diagnosis not present

## 2019-08-25 DIAGNOSIS — G4700 Insomnia, unspecified: Secondary | ICD-10-CM | POA: Diagnosis not present

## 2019-08-25 DIAGNOSIS — S72001D Fracture of unspecified part of neck of right femur, subsequent encounter for closed fracture with routine healing: Secondary | ICD-10-CM | POA: Diagnosis not present

## 2019-09-06 ENCOUNTER — Telehealth: Payer: Self-pay | Admitting: Family Medicine

## 2019-09-06 NOTE — Telephone Encounter (Signed)
Please review and advise.

## 2019-09-06 NOTE — Telephone Encounter (Signed)
Vincent Ayala  [phone] Called to check if office received a fax on 09-01-19. It was a form regarding Veteran's Aid and Attendance Form Dr. Rosanna Randy needs to fill out 2 sections of the form.  Please call [name] back to let him know it was received and the status.  Thanks, [employer]

## 2019-09-08 NOTE — Telephone Encounter (Signed)
Done. thanks

## 2019-09-08 NOTE — Telephone Encounter (Signed)
Do you still have this? Please advise. Thanks!

## 2019-09-08 NOTE — Telephone Encounter (Signed)
Pt's son called regarding the form that needs to be completed for [place].  Son has an appt tomorrow am at the [place].  CB#  [phone]  Con Memos

## 2019-09-13 DIAGNOSIS — M1711 Unilateral primary osteoarthritis, right knee: Secondary | ICD-10-CM | POA: Diagnosis not present

## 2019-12-13 ENCOUNTER — Ambulatory Visit: Payer: PPO | Attending: Internal Medicine

## 2019-12-13 DIAGNOSIS — Z20828 Contact with and (suspected) exposure to other viral communicable diseases: Secondary | ICD-10-CM | POA: Diagnosis not present

## 2019-12-13 DIAGNOSIS — Z20822 Contact with and (suspected) exposure to covid-19: Secondary | ICD-10-CM

## 2019-12-15 ENCOUNTER — Telehealth: Payer: Self-pay | Admitting: Family Medicine

## 2019-12-15 LAB — NOVEL CORONAVIRUS, NAA: SARS-CoV-2, NAA: NOT DETECTED

## 2019-12-15 NOTE — Telephone Encounter (Signed)
Negative COVID results given. Patient results "NOT Detected." Caller expressed understanding. ° °

## 2019-12-26 ENCOUNTER — Telehealth: Payer: Self-pay | Admitting: Family Medicine

## 2019-12-26 NOTE — Telephone Encounter (Signed)
Please advise 

## 2019-12-26 NOTE — Telephone Encounter (Signed)
Pt's wife stated pt has strong smelling urine that is dark yellow. They are not sure if pt has a UTI. No other symptoms but they would like to know if Dr. Rosanna Randy could send in a rx or if they could bring in sample to be tested today. Please advise.

## 2019-12-27 ENCOUNTER — Telehealth: Payer: Self-pay | Admitting: Family Medicine

## 2019-12-27 NOTE — Telephone Encounter (Signed)
Freda Munro called office.  She said  Dr. Rosanna Randy says it will be ok to come by and pick up the specimen cup to get a urine specimen from pt. Dr. Tama Headings to call office and let us know.    Adela Lank said she will get the cup at a medical supply place and send urine specimen by pt's caretaker -  Lucienne Capers to keep down on the confusion.  Thanks, [employer]

## 2019-12-27 NOTE — Telephone Encounter (Signed)
Urine C and S. 

## 2019-12-28 ENCOUNTER — Other Ambulatory Visit: Payer: Self-pay

## 2019-12-28 DIAGNOSIS — N390 Urinary tract infection, site not specified: Secondary | ICD-10-CM | POA: Diagnosis not present

## 2019-12-28 DIAGNOSIS — R319 Hematuria, unspecified: Secondary | ICD-10-CM

## 2019-12-28 NOTE — Telephone Encounter (Signed)
Patient's wife and caregiver are bringing in a sample to the office today for testing.

## 2019-12-29 ENCOUNTER — Ambulatory Visit: Payer: PPO | Admitting: Family Medicine

## 2020-01-01 LAB — URINE CULTURE

## 2020-01-12 ENCOUNTER — Telehealth: Payer: Self-pay

## 2020-01-12 DIAGNOSIS — R319 Hematuria, unspecified: Secondary | ICD-10-CM

## 2020-01-12 DIAGNOSIS — N390 Urinary tract infection, site not specified: Secondary | ICD-10-CM

## 2020-01-12 MED ORDER — NITROFURANTOIN MONOHYD MACRO 100 MG PO CAPS: 100.0000 mg | ORAL_CAPSULE | Freq: Two times a day (BID) | ORAL | Status: DC

## 2020-01-12 NOTE — Telephone Encounter (Signed)
Left detailed VM advising on Bill's labs. Sent in Prescription to [facility].

## 2020-01-13 MED ORDER — NITROFURANTOIN MONOHYD MACRO 100 MG PO CAPS: 100.0000 mg | ORAL_CAPSULE | Freq: Two times a day (BID) | ORAL | 0 refills | Status: DC

## 2020-01-13 NOTE — Telephone Encounter (Signed)
Prescription re-sent to pharmacy.

## 2020-01-13 NOTE — Addendum Note (Signed)
Addended by: Gerald Stabs on: 01/13/2020 01:34 PM   Modules accepted: Orders

## 2020-01-13 NOTE — Telephone Encounter (Signed)
Wife calling back this afternoon, as the Rx is not at [facility].  nitrofurantoin (macrocrystal-monohydrate) (MACROBID) capsule 100 mg  (this is under clinic administered meds, and never made it to the pharmacy) Can you please resend?

## 2020-01-13 NOTE — Telephone Encounter (Signed)
Pharmacy called stating that they had not received the pts antibiotic. Please advise .

## 2020-01-13 NOTE — Telephone Encounter (Signed)
LMOVM Rx at the pharmacy.

## 2020-02-15 ENCOUNTER — Other Ambulatory Visit: Payer: Self-pay | Admitting: Family Medicine

## 2020-02-15 NOTE — Telephone Encounter (Signed)
Requested medication (s) are due for refill today: Yes  Requested medication (s) are on the active medication list: Yes  Last refill:  02/05/19  Future visit scheduled: No  Notes to clinic:  Prescription has expired.    Requested Prescriptions  Pending Prescriptions Disp Refills   losartan (COZAAR) 50 MG tablet [Pharmacy Med Name: LOSARTAN POTASSIUM 50 MG TAB] 30 tablet 12    Sig: TAKE 1 TABLET BY MOUTH DAILY      Cardiovascular:  Angiotensin Receptor Blockers Failed - 02/15/2020  3:33 PM      Failed - Cr in normal range and within 180 days    Creat  Date Value Ref Range Status  10/07/2017 1.39 (H) 0.70 - 1.18 mg/dL Final    Comment:    For patients >33 years of age, the reference limit for Creatinine is approximately 13% higher for people identified as African-American. .    Creatinine, Ser  Date Value Ref Range Status  07/09/2019 1.36 (H) 0.61 - 1.24 mg/dL Final          Failed - K in normal range and within 180 days    Potassium  Date Value Ref Range Status  07/09/2019 3.7 3.5 - 5.1 mmol/L Final          Failed - Valid encounter within last 6 months    Recent Outpatient Visits           6 months ago Frontotemporal dementia [facility])   [facility] Jerrol Banana., MD   9 months ago TIA (transient ischemic attack)   [facility] Jerrol Banana., MD   1 year ago Viral URI with cough   [place], [facility]ickki Muff, [place]   1 year ago Frontotemporal dementia [facility])   [facility] Jerrol Banana., MD   1 year ago Essential hypertension   [facility] Jerrol Banana., MD              Passed - Patient is not pregnant      Passed - Last BP in normal range    BP Readings from Last 1 Encounters:  08/17/19 130/66

## 2020-04-25 ENCOUNTER — Other Ambulatory Visit: Payer: Self-pay | Admitting: Family Medicine

## 2020-05-26 DIAGNOSIS — S42212A Unspecified displaced fracture of surgical neck of left humerus, initial encounter for closed fracture: Secondary | ICD-10-CM | POA: Diagnosis not present

## 2020-05-26 DIAGNOSIS — S59902A Unspecified injury of left elbow, initial encounter: Secondary | ICD-10-CM | POA: Diagnosis not present

## 2020-05-26 DIAGNOSIS — S4992XA Unspecified injury of left shoulder and upper arm, initial encounter: Secondary | ICD-10-CM | POA: Diagnosis not present

## 2020-05-26 DIAGNOSIS — S42202A Unspecified fracture of upper end of left humerus, initial encounter for closed fracture: Secondary | ICD-10-CM | POA: Diagnosis not present

## 2020-05-29 ENCOUNTER — Other Ambulatory Visit: Payer: Self-pay | Admitting: Orthopedic Surgery

## 2020-05-29 ENCOUNTER — Ambulatory Visit: Payer: PPO

## 2020-05-29 DIAGNOSIS — S42202A Unspecified fracture of upper end of left humerus, initial encounter for closed fracture: Secondary | ICD-10-CM | POA: Diagnosis not present

## 2020-06-06 ENCOUNTER — Telehealth: Payer: Self-pay

## 2020-06-06 NOTE — Telephone Encounter (Signed)
Telephone call to schedule palliative care visit.  Wife Vincent Ayala in agreement with palliative care team making home visit 06/07/20 at 12:00 PM.

## 2020-06-07 ENCOUNTER — Other Ambulatory Visit: Payer: Self-pay

## 2020-06-07 ENCOUNTER — Other Ambulatory Visit: Payer: PPO

## 2020-06-07 NOTE — Progress Notes (Unsigned)
PATIENT NAME: Vincent Ayala. Maestas DOB: May 22, 1946 MRN: [id number]  PRIMARY CARE PROVIDER: Jerrol Banana., MD  RESPONSIBLE PARTY:  Acct ID - Guarantor Home Phone Work Phone Relationship Acct Type  [id number] RICAHRD, [phone]  Self P/F     [address], [place], [place] [address]    PLAN OF CARE and INTERVENTIONS:               1.  GOALS OF CARE/ ADVANCE CARE PLANNING:  Familys long term goal is for patient to be placed at a Assisted Living or [place] facility.                2.  PATIENT/CAREGIVER EDUCATION:  Education on palliative care services, Education on fall precautions, Education on s/s of infection, reviewed meds, support.                  4. PERSONAL EMERGENCY PLAN:                5.  DISEASE STATUS:  SW [place] and RN made scheduled palliative care home visit. Palliative care team met with patient, patients wife Vincent Ayala.  Patients son Vincent Ayala and his wife Vincent Ayala arrived shortly after palliative care team. Patient is 74 year old patient of Dr. Trena Platt with a diagnosis of dementia. Patient son Vincent Ayala reports his mom has been diagnosed with dementia as well. Patient's past medical history includes but not limited to dementia, frontal lobe temporal dementia, hypertension, sleep apnea, osteoporosis, cervical radiculopathy, closed right hip fracture, decreased hearing, depression and Polymyalgia rheumatica.   Patient sitting in his recliner chair eating lunch when palliative care team arrived. Wife reports patient eats a Medical laboratory scientific officer and fries for lunch everyday. Patient is 6'3 and current weight is 283.2 lbs. Patient is able to feed himself. Son reports patient suffered a fall outside of [employer] 2 weeks ago. Patient fractured left shoulder. Patient currently has a sling on left shoulder.  MD did not feel patient would recover well with surgery on shoulder. Patient reports he has pain in his left upper arm. Patient taking Tylenol for pain.  Patient is independent with  ambulation. Son assists patient with personal care. Son usually comes to home every evening to help wife get patient into bed. Patient suffered a fall in July last year requiring surgery of right hip. Patient sleeps well at night and has been sleeping more during the day. Patient wears Pull-Ups for urinary incontinence. Son reports patient and wife have caregiver in the home from 21 to 32 daily. Family's goal for patient is comfort.   Family would like for patient to be placed in a assisted living or Memory Care however patient has not been able to qualify for Medicaid.  Son and DIL report they are doing all they can to help  patient and wife. Patients vital signs are stable other than BP slightly elevated. Patient has bruising to left upper arm from fall and shoulder fracture.  Patient has trace edema in lower extremities. Nurse reviewed patients medications with son. Family in agreement with palliative care services. Family encouraged to contact palliative care with questions or concerns. Social worker to provide information on assisted living and [employer].      HISTORY OF PRESENT ILLNESS: Patient is a 74 year old male who resides in home with his wife Vincent Ayala.  Patient's son Vincent Ayala and his wife Vincent Ayala are supportive.  Family open to palliative care services.  Patient will be  seen monthly and PRN.     CODE STATUS: DNR  ADVANCED DIRECTIVES: Y (Living Will) MOST FORM: No PPS: 50%   PHYSICAL EXAM:   VITALS: Today's Vitals   06/07/20 1310  BP: (!) 142/90  Pulse: 76  Resp: 16  Temp: 98.1 F (36.7 C)  TempSrc: Temporal  SpO2: 96%  Weight: 283 lb 3.2 oz (128.5 kg)  Height: '6\' 3"'  (1.905 m)  PainSc: 3   PainLoc: Shoulder    LUNGS: clear to auscultation  CARDIAC: Cor RRR  EXTREMITIES: Trace edema SKIN: Bruising to left upper arm NEURO: positive for gait problems and memory problems       Nilda Simmer, RN

## 2020-06-26 DIAGNOSIS — S42202A Unspecified fracture of upper end of left humerus, initial encounter for closed fracture: Secondary | ICD-10-CM | POA: Diagnosis not present

## 2020-07-02 ENCOUNTER — Other Ambulatory Visit: Payer: PPO

## 2020-07-02 ENCOUNTER — Other Ambulatory Visit: Payer: Self-pay

## 2020-07-02 DIAGNOSIS — Z515 Encounter for palliative care: Secondary | ICD-10-CM

## 2020-07-02 NOTE — Progress Notes (Signed)
COMMUNITY PALLIATIVE CARE SW NOTE  PATIENT NAME: Vincent Ayala. Vincent Ayala DOB: 07-Feb-1946 MRN: [id number]  PRIMARY CARE PROVIDER: Jerrol Ayala., MD  RESPONSIBLE PARTY:  Acct ID - Guarantor Home Phone Work Phone Relationship Acct Type  [id number] Vincent, [id number]  Self P/F     [address], [place], [place] [address]     PLAN OF CARE and INTERVENTIONS:             1. GOALS OF CARE/ ADVANCE CARE PLANNING:  Patient is DNR, DNR form is complete. Son has HCPOA. Patient has living will, Patients goal is to remain in the home with wife for as long as possible. Son has discussed [place] placement for patient in the future. 2. SOCIAL/EMOTIONAL/SPIRITUAL ASSESSMENT/ INTERVENTIONS:  SW met with patient, patients wife Vincent Ayala and son Vincent Ayala. Patient was sitting in recliner during visit. Patient shared that he has been in pain from his shoulder fracture and takes OTC tylenol. Son shared that patient had f/u ortho visit last week was told that the fracture is healing well and that patient will have at last 3 weeks in arm sling. Ortho to order [facility] therapy within the next week. Son assist patient with ensuring his sling on properly daily. Patient shared that he eats good, no loss in appetite and weighs himself daily. Patient shares that he sleeps well and naps throughout the day. Wife is very supportive and provides assistance with meals and ADLS, son visits daily and assist with HS ADLS. Son shared that he has noticed patient is becoming more incontinent and attmepts to remind patient to brief or underwear when he goes to restroom. Son openly discussed patients living will and wishes for DNR with patients wife, DNR form completed during visit. Son shared that he will be having back surgery within the next 2 months and worries about evening care for patient, SW discussed private duty option, son stated that he will speak with the private aid they have arranged for patient in the mornings about her availability or  suggestions. Son shared that he received SW's email last month list of potential ALF's for patient as well as [employer] information to assist with the Medicaid process, son is still working through this and does not have nay additional questions for SW at this time. Wife shared that she wants patient to remain at home for as long as possible, as  Long as sh can assist with ADL's. SW assessed SDH and provided supportive listening and education. SW offered dementia and alzheimer's support groups for wife, wife stated she is interested, SW to email information to son. Wife and son are very appreciative of visits. Palliative care to continue to follow.  3. PATIENT/CAREGIVER EDUCATION/ COPING: Patient was alert for the first half of the visit and expressed needs, patient dozed off at end of visit. Patient has apparent memory deficits and asks wife and son for certain details. Per son, wife appears to have some memory deficits as well. Son and DIL live locally and are very supportive.  4. PERSONAL EMERGENCY PLAN:  Patient to call 9-1-1 for emergencies.  5. COMMUNITY RESOURCES COORDINATION/ HEALTH CARE NAVIGATION: Son and Vincent Ayala manages patients care. Patient has private caregiver 7 days a week 8am-noon. Ortho to order in home therapy for patient,  6. FINANCIAL/LEGAL CONCERNS/INTERVENTIONS:  Son is attempting to apply for LTC or [place] Medicaid for patient,      SOCIAL HX:  Social History   Tobacco Use   Smoking  status: Never Smoker   Smokeless tobacco: Never Used  Substance Use Topics   Alcohol use: No    CODE STATUS: DNR ADVANCED DIRECTIVES: HCPOA AND LIVING WILL MOST FORM COMPLETE:  N HOSPICE EDUCATION PROVIDED: N  PPS: Patient is ambulatory with out an AD but has RW and SPC when needed. Wife, son and personal care aid assist with ADLS. Son shares that patient is more incontinent. Patient is able to feed self.     Time Spent: Visit lasted 45 min.    Vincent Ayala, [place]

## 2020-07-03 DIAGNOSIS — F329 Major depressive disorder, single episode, unspecified: Secondary | ICD-10-CM | POA: Diagnosis not present

## 2020-07-03 DIAGNOSIS — F0280 Dementia in other diseases classified elsewhere without behavioral disturbance: Secondary | ICD-10-CM | POA: Diagnosis not present

## 2020-07-03 DIAGNOSIS — I10 Essential (primary) hypertension: Secondary | ICD-10-CM | POA: Diagnosis not present

## 2020-07-03 DIAGNOSIS — M069 Rheumatoid arthritis, unspecified: Secondary | ICD-10-CM | POA: Diagnosis not present

## 2020-07-03 DIAGNOSIS — G3109 Other frontotemporal dementia: Secondary | ICD-10-CM | POA: Diagnosis not present

## 2020-07-03 DIAGNOSIS — Z9181 History of falling: Secondary | ICD-10-CM | POA: Diagnosis not present

## 2020-07-03 DIAGNOSIS — M1990 Unspecified osteoarthritis, unspecified site: Secondary | ICD-10-CM | POA: Diagnosis not present

## 2020-07-03 DIAGNOSIS — S42202D Unspecified fracture of upper end of left humerus, subsequent encounter for fracture with routine healing: Secondary | ICD-10-CM | POA: Diagnosis not present

## 2020-07-05 ENCOUNTER — Telehealth: Payer: Self-pay

## 2020-07-05 NOTE — Telephone Encounter (Signed)
Please triage him and be sure no signs of severe infection, advise to hydrate and keep office visit in the morning for evaluation.

## 2020-07-05 NOTE — Telephone Encounter (Signed)
Please review and advise. Patient has an appointment with Sharyn Lull tomorrow at 8:40am.  Copied from [place] [id number]. Topic: General - Other >> Jul 05, 2020  8:31 AM Celene Kras wrote: Reason for CRM: Pts wife states that she believes the pt has a uti. She is requesting to know if there is anything she can give him OTC before his appt tomorrow. Please advise.

## 2020-07-05 NOTE — Telephone Encounter (Signed)
Copied from [place] [id number]. Topic: General - Other >> Jul 05, 2020  8:31 AM Celene Kras wrote: Reason for CRM: Pts wife states that she believes the pt has a uti. She is requesting to know if there is anything she can give him OTC before his appt tomorrow. Please advise. >> Jul 05, 2020  9:06 AM Greggory Keen D wrote: Pt's wife called back asking if he could come by and give a urine sample to the office.  She says he has dementia and he has been a patient of Dr Marlan Palau for 20 years.  CB#  [phone]

## 2020-07-06 ENCOUNTER — Ambulatory Visit: Payer: PPO | Admitting: Adult Health

## 2020-07-06 NOTE — Telephone Encounter (Signed)
FYI he missed appointment.

## 2020-07-06 NOTE — Telephone Encounter (Signed)
Patient seen somewhere else.

## 2020-07-06 NOTE — Progress Notes (Deleted)
     Established patient visit   Patient: Vincent Ayala. Vincent Ayala   DOB: 05-13-1946   74 y.o. Male  MRN: [id number] Visit Date: 07/06/2020  Today's healthcare provider: Marcille Buffy, FNP   No chief complaint on file.  Subjective    HPI  ***  {Show patient history (optional):23778::" "}   Medications: Outpatient Medications Prior to Visit  Medication Sig  . bisacodyl (DULCOLAX) 5 MG EC tablet Take 2 tablets (10 mg total) by mouth daily as needed for moderate constipation.  . Calcium Carbonate-Vitamin D 600-200 MG-UNIT TABS Take by mouth.  . calcium-vitamin D 250-100 MG-UNIT tablet Take 1 tablet by mouth 2 (two) times daily.  Marland Kitchen docusate sodium (COLACE) 100 MG capsule Take 1 capsule (100 mg total) by mouth 2 (two) times daily.  Marland Kitchen enoxaparin (LOVENOX) 40 MG/0.4ML injection Inject 0.4 mLs (40 mg total) into the skin daily for 28 days.  Marland Kitchen gabapentin (NEURONTIN) 300 MG capsule Take 1 capsule (300 mg total) by mouth 3 (three) times daily. (Patient not taking: Reported on 06/07/2020)  . lamoTRIgine (LAMICTAL) 100 MG tablet Take 1 tablet (100 mg total) by mouth 2 (two) times daily.  Marland Kitchen lamoTRIgine (LAMICTAL) 150 MG tablet Take 150 mg by mouth 2 (two) times daily.  Marland Kitchen losartan (COZAAR) 50 MG tablet TAKE 1 TABLET BY MOUTH DAILY  . Melatonin 3 MG TABS Take 6 mg by mouth at bedtime.   . memantine (NAMENDA) 10 MG tablet Take 10 mg by mouth daily.   . mirtazapine (REMERON) 15 MG tablet Take 1 tablet (15 mg total) by mouth at bedtime. (Patient not taking: Reported on 07/06/2019)  . Multiple Vitamin (MULTIVITAMIN) tablet Take 1 tablet by mouth daily.  . nitrofurantoin, macrocrystal-monohydrate, (MACROBID) 100 MG capsule Take 1 capsule (100 mg total) by mouth 2 (two) times daily. (Patient not taking: Reported on 06/07/2020)  . QUEtiapine (SEROQUEL) 50 MG tablet Take 50 mg by mouth 2 (two) times a day.  . sertraline (ZOLOFT) 100 MG tablet TAKE ONE AND ONE-HALF TABLETS DAILY  .  sulfamethoxazole-trimethoprim (BACTRIM) 400-80 MG tablet Take 1 tablet by mouth 2 (two) times daily. (Patient not taking: Reported on 06/07/2020)  . traZODone (DESYREL) 50 MG tablet Take 50 mg by mouth at bedtime. (Patient not taking: Reported on 06/07/2020)   No facility-administered medications prior to visit.    Review of Systems  {Heme  Chem  Endocrine  Serology  Results Review (optional):23779::" "}  Objective    There were no vitals taken for this visit. {Show previous vital signs (optional):23777::" "}  Physical Exam  ***  No results found for any visits on 07/06/20.  Assessment & Plan     ***  No follow-ups on file.      {provider attestation***:1}   Marcille Buffy, [place] [phone] (phone) [phone] (fax)  [place]

## 2020-07-10 ENCOUNTER — Telehealth: Payer: Self-pay

## 2020-07-10 NOTE — Telephone Encounter (Signed)
I have not seen patient for this issue.  I have not seen him for a year.  They have a call into [facility] also for an appointment. I think the [facility] clinic might have handled this for him.

## 2020-07-10 NOTE — Telephone Encounter (Signed)
I do not see anything since January   Copied from [place] #[id number]. Topic: General - Other >> Jul 10, 2020 10:07 AM Lennox Solders wrote: Pt has Reason for CRM: pt wife is calling and would like to know if she can drop off urine sample. Pt has foul urine odor. Pt also has dementia.[facility] [place] >> Jul 10, 2020 11:03 AM Rainey Pines A wrote: Patients son called in regards to same issue and stated that he would like a callback in regards to Dr. Rosanna Randy sending in an antibiotic for patient. Patients son stated that Dr. Rosanna Randy has already seen patient for this issue and would like a call once medication has been sent to pharmacy. Please advise best contact [phone]

## 2020-07-10 NOTE — Telephone Encounter (Signed)
Patient was a no show for his appointment on 07/06/20 for UTI.  Another appointment has been made for him on 07/11/20

## 2020-07-11 ENCOUNTER — Other Ambulatory Visit: Payer: Self-pay

## 2020-07-11 ENCOUNTER — Ambulatory Visit (INDEPENDENT_AMBULATORY_CARE_PROVIDER_SITE_OTHER): Payer: PPO | Admitting: Family Medicine

## 2020-07-11 ENCOUNTER — Telehealth: Payer: Self-pay | Admitting: Family Medicine

## 2020-07-11 VITALS — BP 120/60 | HR 97 | Temp 97.1°F | Wt 278.0 lb

## 2020-07-11 DIAGNOSIS — G3109 Other frontotemporal dementia: Secondary | ICD-10-CM

## 2020-07-11 DIAGNOSIS — R319 Hematuria, unspecified: Secondary | ICD-10-CM | POA: Diagnosis not present

## 2020-07-11 DIAGNOSIS — N390 Urinary tract infection, site not specified: Secondary | ICD-10-CM

## 2020-07-11 DIAGNOSIS — F0280 Dementia in other diseases classified elsewhere without behavioral disturbance: Secondary | ICD-10-CM

## 2020-07-11 DIAGNOSIS — I10 Essential (primary) hypertension: Secondary | ICD-10-CM

## 2020-07-11 LAB — POCT URINALYSIS DIPSTICK
Bilirubin, UA: NEGATIVE
Glucose, UA: NEGATIVE
Ketones, UA: NEGATIVE
Nitrite, UA: NEGATIVE
Protein, UA: NEGATIVE
Spec Grav, UA: 1.025 (ref 1.010–1.025)
Urobilinogen, UA: 0.2 E.U./dL
pH, UA: 6 (ref 5.0–8.0)

## 2020-07-11 MED ORDER — CIPROFLOXACIN HCL 500 MG PO TABS: 500.0000 mg | ORAL_TABLET | Freq: Two times a day (BID) | ORAL | 0 refills | Status: DC

## 2020-07-11 MED ORDER — CEPHALEXIN 500 MG PO CAPS: 500.0000 mg | ORAL_CAPSULE | Freq: Two times a day (BID) | ORAL | 0 refills | Status: DC

## 2020-07-11 NOTE — Telephone Encounter (Signed)
Pharmacy called stating that the pt has a prescription for Keflex. They state that the pt is also allergic to penicillin and the pts wife is concerned he may have a reaction. Please advise.     [facility] - [place], [place]  [place] [address]  Phone: [phone] Fax: [phone]  Hours: Not open 24 hours

## 2020-07-11 NOTE — Telephone Encounter (Signed)
Rx changed to Cipro

## 2020-07-11 NOTE — Progress Notes (Signed)
Acute Office Visit  Subjective:    Patient ID: Vincent Ayala, male    DOB: [birth year], 74 y.o.   MRN: [id number]  Chief Complaint  Patient presents with  . Urinary Tract Infection   HPI  Patient is in today for symptoms of UTI.  He has no urinary symptoms but family and caregiver says that his urine is strong and smells of a foul odor.  No pain no physical symptoms. He continues to have problems with temporal dementia but no behavioral problems recently. He has had a left arm fracture recently.  This was from a fall. He has had no recent falls.  I have not seen the patient in in a year. Past Medical History:  Diagnosis Date  . 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 Depression   . 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 Hypertension     Past Surgical History:  Procedure Laterality Date  . HIP ARTHROPLASTY Right 07/07/2019   Procedure: ARTHROPLASTY BIPOLAR HIP (HEMIARTHROPLASTY);  Surgeon: Lovell Sheehan, MD;  Location: [facility] ORS;  Service: Orthopedics;  Laterality: Right;  . 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 SPINE SURGERY     ruptured disc    No family history on file.  Social History   Socioeconomic History  . Marital status: Married    Spouse name: Not on file  . Number of children: 2  . Years of education: Not on file  . Highest education level: Some college, no degree  Occupational History  . Occupation: retired  Tobacco Use  . Smoking status: Never Smoker  . Smokeless tobacco: Never Used  Vaping Use  . Vaping Use: Never used  Substance and Sexual Activity  . Alcohol use: No  . Drug use: No  . Sexual activity: Not on file  Other Topics Concern  . Not on file  Social History Narrative  . Not on file   Social Determinants of Health   Financial Resource Strain:   . Difficulty of Paying Living Expenses:   Food Insecurity:   . Worried About Sales executive in the Last Year:   . Arboriculturist in the Last Year:   Transportation Needs:   . Film/video editor (Medical):   Marland Kitchen Lack of Transportation (Non-Medical):   Physical Activity:   . Days of Exercise per Week:   . Minutes of Exercise per Session:   Stress:   . Feeling of Stress :   Social Connections:   . Frequency of Communication with Friends and Family:   . Frequency of Social Gatherings with Friends and Family:   . Attends Religious Services:   . Active Member of Clubs or Organizations:   . Attends Archivist Meetings:   Marland Kitchen Marital Status:   Intimate Partner Violence:   . Fear of Current or Ex-Partner:   . Emotionally Abused:   Marland Kitchen Physically Abused:   . Sexually Abused:     Outpatient Medications Prior to Visit  Medication Sig Dispense Refill  . bisacodyl (DULCOLAX) 5 MG EC tablet Take 2 tablets (10 mg total) by mouth daily as needed for moderate constipation. 30 tablet 0  . Calcium Carbonate-Vitamin D 600-200 MG-UNIT TABS Take by mouth.    . calcium-vitamin D 250-100 MG-UNIT tablet Take 1 tablet by mouth 2 (two) times daily.    Marland Kitchen  docusate sodium (COLACE) 100 MG capsule Take 1 capsule (100 mg total) by mouth 2 (two) times daily. 10 capsule 0  . lamoTRIgine (LAMICTAL) 100 MG tablet Take 1 tablet (100 mg total) by mouth 2 (two) times daily. 60 tablet 11  . lamoTRIgine (LAMICTAL) 150 MG tablet Take 150 mg by mouth 2 (two) times daily.    Marland Kitchen losartan (COZAAR) 50 MG tablet TAKE 1 TABLET BY MOUTH DAILY 30 tablet 12  . Melatonin 3 MG TABS Take 6 mg by mouth at bedtime.     . memantine (NAMENDA) 10 MG tablet Take 10 mg by mouth daily.     . mirtazapine (REMERON) 15 MG tablet Take 1 tablet (15 mg total) by mouth at bedtime. 90 tablet 1  . Multiple Vitamin (MULTIVITAMIN) tablet Take 1 tablet by mouth daily.    . nitrofurantoin, macrocrystal-monohydrate, (MACROBID) 100 MG capsule Take 1 capsule (100 mg total) by mouth 2 (two) times daily. 14 capsule 0  . QUEtiapine  (SEROQUEL) 50 MG tablet Take 50 mg by mouth 2 (two) times a day.    . sertraline (ZOLOFT) 100 MG tablet TAKE ONE AND ONE-HALF TABLETS DAILY 45 tablet 3  . sulfamethoxazole-trimethoprim (BACTRIM) 400-80 MG tablet Take 1 tablet by mouth 2 (two) times daily. 10 tablet 0  . traZODone (DESYREL) 50 MG tablet Take 50 mg by mouth at bedtime.     . enoxaparin (LOVENOX) 40 MG/0.4ML injection Inject 0.4 mLs (40 mg total) into the skin daily for 28 days.    Marland Kitchen gabapentin (NEURONTIN) 300 MG capsule Take 1 capsule (300 mg total) by mouth 3 (three) times daily. (Patient not taking: Reported on 07/11/2020)     No facility-administered medications prior to visit.    Allergies  Allergen Reactions  . Penicillins     Review of Systems  Genitourinary: Positive for enuresis and frequency. Negative for difficulty urinating, dysuria, flank pain, hematuria and urgency.       Also complains of odor and cloudy urine.  Musculoskeletal: Positive for back pain.       Objective:    Physical Exam Vitals reviewed.  Constitutional:      Appearance: He is well-developed.  HENT:     Head: Normocephalic and atraumatic.     Right Ear: External ear normal.     Left Ear: External ear normal.     Nose: Nose normal.  Eyes:     General:        Left eye: No discharge.     Conjunctiva/sclera: Conjunctivae normal.  Neck:     Thyroid: No thyromegaly.  Cardiovascular:     Rate and Rhythm: Normal rate and regular rhythm.     Heart sounds: Normal heart sounds.  Pulmonary:     Effort: Pulmonary effort is normal.     Breath sounds: Normal breath sounds.  Abdominal:     Palpations: Abdomen is soft.     Tenderness: There is no abdominal tenderness.     Comments: No CVAT.  Musculoskeletal:     Comments: Ambulating well today.  He has a sling on his left arm  Skin:    General: Skin is warm and dry.  Neurological:     General: No focal deficit present.     Mental Status: He is alert.  Psychiatric:        Mood and  Affect: Mood normal.        Behavior: Behavior normal.     BP (!) 120/60 (BP Location: Right Arm, Patient Position:  Sitting, Cuff Size: Large)   Pulse 97   Temp (!) 97.1 F (36.2 C) (Skin)   Wt (!) 278 lb (126.1 kg)   SpO2 98%   BMI 34.75 kg/m  Wt Readings from Last 3 Encounters:  07/11/20 (!) 278 lb (126.1 kg)  06/07/20 283 lb 3.2 oz (128.5 kg)  08/16/19 265 lb (120.2 kg)    Health Maintenance Due  Topic Date Due  . Hepatitis C Screening  Never done  . COVID-19 Vaccine (1) Never done    There are no preventive care reminders to display for this patient.   Lab Results  Component Value Date   TSH 1.050 07/12/2018   Lab Results  Component Value Date   WBC 10.2 07/08/2019   HGB 11.2 (L) 07/08/2019   HCT 34.2 (L) 07/08/2019   MCV 93.4 07/08/2019   PLT 225 07/08/2019   Lab Results  Component Value Date   NA 138 07/09/2019   K 3.7 07/09/2019   CO2 25 07/09/2019   GLUCOSE 158 (H) 07/09/2019   BUN 31 (H) 07/09/2019   CREATININE 1.36 (H) 07/09/2019   BILITOT 0.4 07/12/2018   ALKPHOS 80 07/12/2018   AST 18 07/12/2018   ALT 19 07/12/2018   PROT 6.8 07/12/2018   ALBUMIN 4.3 07/12/2018   CALCIUM 8.2 (L) 07/09/2019   ANIONGAP 5 07/09/2019   Lab Results  Component Value Date   CHOL 134 07/12/2018   Lab Results  Component Value Date   HDL 45 07/12/2018   Lab Results  Component Value Date   LDLCALC 59 07/12/2018   Lab Results  Component Value Date   TRIG 150 (H) 07/12/2018   Lab Results  Component Value Date   CHOLHDL 3.0 07/12/2018   No results found for: HGBA1C     Assessment & Plan:   Problem List Items Addressed This Visit    None     Problem List Items Addressed This Visit      Cardiovascular and Mediastinum   Hypertension   Relevant Orders   CBC with Differential/Platelet (Completed)   Comprehensive metabolic panel (Completed)   TSH (Completed)     Nervous and Auditory   Frontotemporal dementia ([place])    Other Visit Diagnoses     Urinary tract infection with hematuria, site unspecified    -  Primary   Relevant Medications   cephALEXin (KEFLEX) 500 MG capsule   Other Relevant Orders   POCT urinalysis dipstick (Completed)   Urine Culture (Completed)    Frontotemporal dementia is progressive but not much so in the last year. Obtain lab work which the patient has not had in a year.  Follow-up in the fall.   No orders of the defined types were placed in this encounter.    Juluis Mire, CMA

## 2020-07-12 ENCOUNTER — Ambulatory Visit: Payer: Self-pay | Admitting: Family Medicine

## 2020-07-12 DIAGNOSIS — I10 Essential (primary) hypertension: Secondary | ICD-10-CM | POA: Diagnosis not present

## 2020-07-13 LAB — COMPREHENSIVE METABOLIC PANEL
ALT: 16 IU/L (ref 0–44)
AST: 14 IU/L (ref 0–40)
Albumin/Globulin Ratio: 2 (ref 1.2–2.2)
Albumin: 4.1 g/dL (ref 3.7–4.7)
Alkaline Phosphatase: 151 IU/L — ABNORMAL HIGH (ref 48–121)
BUN/Creatinine Ratio: 12 (ref 10–24)
BUN: 15 mg/dL (ref 8–27)
Bilirubin Total: 0.4 mg/dL (ref 0.0–1.2)
CO2: 21 mmol/L (ref 20–29)
Calcium: 9.1 mg/dL (ref 8.6–10.2)
Chloride: 105 mmol/L (ref 96–106)
Creatinine, Ser: 1.25 mg/dL (ref 0.76–1.27)
GFR calc Af Amer: 65 mL/min/{1.73_m2} (ref >59)
GFR calc non Af Amer: 56 mL/min/{1.73_m2} — ABNORMAL LOW (ref >59)
Globulin, Total: 2.1 g/dL (ref 1.5–4.5)
Glucose: 120 mg/dL — ABNORMAL HIGH (ref 65–99)
Potassium: 3.8 mmol/L (ref 3.5–5.2)
Sodium: 141 mmol/L (ref 134–144)
Total Protein: 6.2 g/dL (ref 6.0–8.5)

## 2020-07-13 LAB — CBC WITH DIFFERENTIAL/PLATELET
Basophils Absolute: 0.1 10*3/uL (ref 0.0–0.2)
Basos: 1 %
EOS (ABSOLUTE): 0.4 10*3/uL (ref 0.0–0.4)
Eos: 6 %
Hematocrit: 39.5 % (ref 37.5–51.0)
Hemoglobin: 13.7 g/dL (ref 13.0–17.7)
Immature Grans (Abs): 0 10*3/uL (ref 0.0–0.1)
Immature Granulocytes: 0 %
Lymphocytes Absolute: 1.4 10*3/uL (ref 0.7–3.1)
Lymphs: 24 %
MCH: 31.9 pg (ref 26.6–33.0)
MCHC: 34.7 g/dL (ref 31.5–35.7)
MCV: 92 fL (ref 79–97)
Monocytes Absolute: 0.5 10*3/uL (ref 0.1–0.9)
Monocytes: 8 %
Neutrophils Absolute: 3.6 10*3/uL (ref 1.4–7.0)
Neutrophils: 61 %
Platelets: 294 10*3/uL (ref 150–450)
RBC: 4.3 x10E6/uL (ref 4.14–5.80)
RDW: 13.2 % (ref 11.6–15.4)
WBC: 6 10*3/uL (ref 3.4–10.8)

## 2020-07-13 LAB — TSH: TSH: 0.575 u[IU]/mL (ref 0.450–4.500)

## 2020-07-14 LAB — URINE CULTURE

## 2020-07-17 DIAGNOSIS — G3109 Other frontotemporal dementia: Secondary | ICD-10-CM | POA: Diagnosis not present

## 2020-07-17 DIAGNOSIS — F329 Major depressive disorder, single episode, unspecified: Secondary | ICD-10-CM | POA: Diagnosis not present

## 2020-07-17 DIAGNOSIS — M1990 Unspecified osteoarthritis, unspecified site: Secondary | ICD-10-CM | POA: Diagnosis not present

## 2020-07-17 DIAGNOSIS — Z9181 History of falling: Secondary | ICD-10-CM | POA: Diagnosis not present

## 2020-07-17 DIAGNOSIS — I10 Essential (primary) hypertension: Secondary | ICD-10-CM | POA: Diagnosis not present

## 2020-07-17 DIAGNOSIS — F0280 Dementia in other diseases classified elsewhere without behavioral disturbance: Secondary | ICD-10-CM | POA: Diagnosis not present

## 2020-07-17 DIAGNOSIS — S42202D Unspecified fracture of upper end of left humerus, subsequent encounter for fracture with routine healing: Secondary | ICD-10-CM | POA: Diagnosis not present

## 2020-07-17 DIAGNOSIS — M069 Rheumatoid arthritis, unspecified: Secondary | ICD-10-CM | POA: Diagnosis not present

## 2020-07-24 DIAGNOSIS — S42202A Unspecified fracture of upper end of left humerus, initial encounter for closed fracture: Secondary | ICD-10-CM | POA: Diagnosis not present

## 2020-07-31 DIAGNOSIS — Z9181 History of falling: Secondary | ICD-10-CM | POA: Diagnosis not present

## 2020-07-31 DIAGNOSIS — F329 Major depressive disorder, single episode, unspecified: Secondary | ICD-10-CM | POA: Diagnosis not present

## 2020-07-31 DIAGNOSIS — G3109 Other frontotemporal dementia: Secondary | ICD-10-CM | POA: Diagnosis not present

## 2020-07-31 DIAGNOSIS — M1990 Unspecified osteoarthritis, unspecified site: Secondary | ICD-10-CM | POA: Diagnosis not present

## 2020-07-31 DIAGNOSIS — S42202D Unspecified fracture of upper end of left humerus, subsequent encounter for fracture with routine healing: Secondary | ICD-10-CM | POA: Diagnosis not present

## 2020-07-31 DIAGNOSIS — M069 Rheumatoid arthritis, unspecified: Secondary | ICD-10-CM | POA: Diagnosis not present

## 2020-07-31 DIAGNOSIS — I10 Essential (primary) hypertension: Secondary | ICD-10-CM | POA: Diagnosis not present

## 2020-07-31 DIAGNOSIS — F0280 Dementia in other diseases classified elsewhere without behavioral disturbance: Secondary | ICD-10-CM | POA: Diagnosis not present

## 2020-08-09 ENCOUNTER — Encounter: Payer: PPO | Admitting: Family Medicine

## 2020-08-21 NOTE — Progress Notes (Signed)
Subjective:   Vincent Ayala is a 74 y.o. male who presents for Medicare Annual/Subsequent preventive examination.  I connected with Charise Killian (caregiver- verbal ok per pt) today by telephone and verified that I am speaking with the correct person using two identifiers. Location patient: home Location provider: work Persons participating in the virtual visit: patient, provider.   I discussed the limitations, risks, security and privacy concerns of performing an evaluation and management service by telephone and the availability of in person appointments. I also discussed with the patient that there may be a patient responsible charge related to this service. The patient expressed understanding and verbally consented to this telephonic visit.    Interactive audio and video telecommunications were attempted between this provider and patient, however failed, due to patient having technical difficulties OR patient did not have access to video capability.  We continued and completed visit with audio only.   Review of Systems    N/A  Cardiac Risk Factors include: advanced age (>59men, >61 women);male gender;hypertension     Objective:    There were no vitals filed for this visit. There is no height or weight on file to calculate BMI.  Advanced Directives 08/22/2020 08/16/2019 07/07/2019 07/06/2019 07/06/2019 12/07/2018 07/12/2018  Does Patient Have a Medical Advance Directive? Unable to assess, patient is non-responsive or altered mental status Yes Yes Yes No Yes Yes  Type of Advance Directive - [place];Living will Healthcare Power of [place] will Living will;Healthcare Power of Attorney  Does patient want to make changes to medical advance directive? - - - No - Patient declined - - -  Copy of [place] in Chart? - No - copy requested - No - copy requested - - No - copy requested    Current Medications: Unable to  verify today due to speaking with caregiver.  Outpatient Encounter Medications as of 08/22/2020  Medication Sig  . bisacodyl (DULCOLAX) 5 MG EC tablet Take 2 tablets (10 mg total) by mouth daily as needed for moderate constipation.  . Calcium Carbonate-Vitamin D 600-200 MG-UNIT TABS Take 1 tablet by mouth daily.   . calcium-vitamin D 250-100 MG-UNIT tablet Take 1 tablet by mouth 2 (two) times daily.  . cephALEXin (KEFLEX) 500 MG capsule Take 1 capsule (500 mg total) by mouth 2 (two) times daily.  . ciprofloxacin (CIPRO) 500 MG tablet Take 1 tablet (500 mg total) by mouth 2 (two) times daily.  Marland Kitchen docusate sodium (COLACE) 100 MG capsule Take 1 capsule (100 mg total) by mouth 2 (two) times daily.  Marland Kitchen enoxaparin (LOVENOX) 40 MG/0.4ML injection Inject 0.4 mLs (40 mg total) into the skin daily for 28 days.  Marland Kitchen gabapentin (NEURONTIN) 300 MG capsule Take 1 capsule (300 mg total) by mouth 3 (three) times daily.  Marland Kitchen lamoTRIgine (LAMICTAL) 100 MG tablet Take 1 tablet (100 mg total) by mouth 2 (two) times daily.  Marland Kitchen lamoTRIgine (LAMICTAL) 150 MG tablet Take 150 mg by mouth 2 (two) times daily.  Marland Kitchen losartan (COZAAR) 50 MG tablet TAKE 1 TABLET BY MOUTH DAILY  . Melatonin 3 MG TABS Take 6 mg by mouth at bedtime.   . memantine (NAMENDA) 10 MG tablet Take 10 mg by mouth daily.   . mirtazapine (REMERON) 15 MG tablet Take 1 tablet (15 mg total) by mouth at bedtime.  . Multiple Vitamin (MULTIVITAMIN) tablet Take 1 tablet by mouth daily.  . nitrofurantoin, macrocrystal-monohydrate, (MACROBID) 100 MG capsule Take  1 capsule (100 mg total) by mouth 2 (two) times daily.  . QUEtiapine (SEROQUEL) 50 MG tablet Take 50 mg by mouth 2 (two) times a day.  . sertraline (ZOLOFT) 100 MG tablet TAKE ONE AND ONE-HALF TABLETS DAILY  . sulfamethoxazole-trimethoprim (BACTRIM) 400-80 MG tablet Take 1 tablet by mouth 2 (two) times daily.  . traZODone (DESYREL) 50 MG tablet Take 50 mg by mouth at bedtime.    No facility-administered  encounter medications on file as of 08/22/2020.    Allergies (verified) Penicillins   History: Past Medical History:  Diagnosis Date  . 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 Depression   . 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 Hypertension    Past Surgical History:  Procedure Laterality Date  . HIP ARTHROPLASTY Right 07/07/2019   Procedure: ARTHROPLASTY BIPOLAR HIP (HEMIARTHROPLASTY);  Surgeon: Lovell Sheehan, MD;  Location: [facility] ORS;  Service: Orthopedics;  Laterality: Right;  . 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 SPINE SURGERY     ruptured disc   History reviewed. No pertinent family history. Social History   Socioeconomic History  . Marital status: Married    Spouse name: Not on file  . Number of children: 2  . Years of education: Not on file  . Highest education level: Some college, no degree  Occupational History  . Occupation: retired  Tobacco Use  . Smoking status: Never Smoker  . Smokeless tobacco: Never Used  Vaping Use  . Vaping Use: Never used  Substance and Sexual Activity  . Alcohol use: No  . Drug use: No  . Sexual activity: Not on file  Other Topics Concern  . Not on file  Social History Narrative  . Not on file   Social Determinants of Health   Financial Resource Strain: Low Risk   . Difficulty of Paying Living Expenses: Not hard at all  Food Insecurity: No Food Insecurity  . Worried About Charity fundraiser in the Last Year: Never true  . Ran Out of Food in the Last Year: Never true  Transportation Needs: No Transportation Needs  . Lack of Transportation (Medical): No  . Lack of Transportation (Non-Medical): No  Physical Activity: Inactive  . Days of Exercise per Week: 0 days  . Minutes of Exercise per Session: 0 min  Stress: No Stress Concern Present  . Feeling of Stress : Only a little  Social Connections: Moderately Isolated  .  Frequency of Communication with Friends and Family: Never  . Frequency of Social Gatherings with Friends and Family: More than three times a week  . Attends Religious Services: Never  . Active Member of Clubs or Organizations: No  . Attends Archivist Meetings: Never  . Marital Status: Married    Tobacco Counseling Counseling given: Not Answered   Clinical Intake:  Pre-visit preparation completed: No  Pain : No/denies pain     Nutritional Risks: None Diabetes: No  How often do you need to have someone help you when you read instructions, pamphlets, or other written materials from your doctor or pharmacy?: 1 - Never  Diabetic? No  Interpreter Needed?: No  Information entered by :: MMarkoski, LPN   Activities of Daily Living In your present state of health, do you have any difficulty performing the following activities: 08/22/2020  Hearing? N  Vision? N  Difficulty  concentrating or making decisions? Y  Comment Has dementia  Walking or climbing stairs? Y  Comment Avoids steps completely.  Dressing or bathing? N  Doing errands, shopping? Y  Comment Does not drive.  Preparing Food and eating ? Y  Comment Does not cook.  Using the Toilet? N  In the past six months, have you accidently leaked urine? Y  Comment Has urine leakage at night.  Do you have problems with loss of bowel control? N  Managing your Medications? Y  Comment Son manages medications.  Managing your Finances? Y  Comment Son manages finances.  Housekeeping or managing your Housekeeping? Y  Comment Has a caregiver to manage cleaning.  Some recent data might be hidden    Patient Care Team: Jerrol Banana., MD as PCP - General (Family Medicine) Vladimir Crofts, MD as Consulting Physician (Neurology) Oneta Rack, MD as Consulting Physician (Dermatology) Leim Fabry, MD as Consulting Physician (Orthopedic Surgery)  Indicate any recent Medical Services you may have received from  other than [facility] providers in the past year (date may be approximate).     Assessment:   This is a routine wellness examination for Mccrae.  Hearing/Vision screen No exam data present  Dietary issues and exercise activities discussed: Current Exercise Habits: The patient does not participate in regular exercise at present, Exercise limited by: Other - see comments (Pt has dementia.)  Goals    . DIET - INCREASE WATER INTAKE     Recommend increasing water intake to 4 glasses a day.     . Prevent falls     Recommend to remove any items from the home that may cause slips or trips.      Depression Screen PHQ 2/9 Scores 08/22/2020 04/28/2019 07/12/2018 07/09/2017 10/01/2016 03/12/2016  PHQ - 2 Score 0 0 1 2 0 6  PHQ- 9 Score - - - 8 - -    Fall Risk Fall Risk  08/22/2020 04/28/2019 07/12/2018 07/09/2017 10/01/2016  Falls in the past year? 1 0 No No No  Number falls in past yr: 0 - - - -  Injury with Fall? 1 - - - -  Risk for fall due to : Impaired balance/gait - - - -  Follow up Falls prevention discussed - - - -    Any stairs in or around the home? No  If so, are there any without handrails? No  Home free of loose throw rugs in walkways, pet beds, electrical cords, etc? Yes  Adequate lighting in your home to reduce risk of falls? Yes   ASSISTIVE DEVICES UTILIZED TO PREVENT FALLS:  Life alert? No  Use of a cane, walker or w/c? Yes  Grab bars in the bathroom? Yes  Shower chair or bench in shower? Yes  Elevated toilet seat or a handicapped toilet? No    Cognitive Function: Unable to complete due to speaking with caregiver.        Immunizations Immunization History  Administered Date(s) Administered  . Influenza, High Dose Seasonal PF 10/01/2016, 10/05/2017, 10/06/2018  . Influenza-Unspecified 10/06/2018  . Pneumococcal Conjugate-13 04/01/2017  . Pneumococcal Polysaccharide-23 12/30/2011  . Td 07/20/1995    TDAP status: Due, Education has been provided regarding the  importance of this vaccine. Advised may receive this vaccine at local pharmacy or Health Dept. Aware to provide a copy of the vaccination record if obtained from local pharmacy or Health Dept. Verbalized acceptance and understanding. Flu Vaccine status: Due to this fall. Pneumococcal vaccine status:  Up to date Covid-19 vaccine status: Completed vaccines  Qualifies for Shingles Vaccine? Yes   Zostavax completed No   Shingrix Completed?: No.    Education has been provided regarding the importance of this vaccine. Patient has been advised to call insurance company to determine out of pocket expense if they have not yet received this vaccine. Advised may also receive vaccine at local pharmacy or Health Dept. Verbalized acceptance and understanding.  Screening Tests Health Maintenance  Topic Date Due  . Hepatitis C Screening  Never done  . COVID-19 Vaccine (1) Never done  . INFLUENZA VACCINE  07/15/2020  . COLONOSCOPY  12/15/2026 (Originally 07/05/1995)  . TETANUS/TDAP  12/15/2026 (Originally 07/19/2005)  . PNA vac Low Risk Adult  Completed    Health Maintenance  Health Maintenance Due  Topic Date Due  . Hepatitis C Screening  Never done  . COVID-19 Vaccine (1) Never done  . INFLUENZA VACCINE  07/15/2020    Colorectal cancer screening: Currently due. Caregiver to speak with wife about order.  Lung Cancer Screening: (Low Dose CT Chest recommended if Age 16-80 years, 30 pack-year currently smoking OR have quit w/in 15years.) does not qualify.   Additional Screening:  Hepatitis C Screening: does qualify; lab due.  Vision Screening: Recommended annual ophthalmology exams for early detection of glaucoma and other disorders of the eye. Is the patient up to date with their annual eye exam?  No Who is the provider or what is the name of the office in which the patient attends annual eye exams? N/A If pt is not established with a provider, would they like to be referred to a provider to  establish care? No .   Dental Screening: Recommended annual dental exams for proper oral hygiene  Community Resource Referral / Chronic Care Management: CRR required this visit?  No   CCM required this visit?  No      Plan:     I have personally reviewed and noted the following in the patient's chart:   . Medical and social history . Use of alcohol, tobacco or illicit drugs  . Current medications and supplements . Functional ability and status . Nutritional status . Physical activity . Advanced directives . List of other physicians . Hospitalizations, surgeries, and ER visits in previous 12 months . Vitals . Screenings to include cognitive, depression, and falls . Referrals and appointments  In addition, I have reviewed and discussed with patient certain preventive protocols, quality metrics, and best practice recommendations. A written personalized care plan for preventive services as well as general preventive health recommendations were provided to patient.     Jessika Rothery [place], [place]   06/22/8920   Nurse Notes: Pt needs a flu shot and the Hep C lab completed at next in office apt. Advised that we need Covid vaccine dates in order to up date in the system.

## 2020-08-22 ENCOUNTER — Other Ambulatory Visit: Payer: Self-pay

## 2020-08-22 ENCOUNTER — Ambulatory Visit (INDEPENDENT_AMBULATORY_CARE_PROVIDER_SITE_OTHER): Payer: PPO

## 2020-08-22 DIAGNOSIS — Z0000 Encounter for general adult medical examination without abnormal findings: Secondary | ICD-10-CM | POA: Diagnosis not present

## 2020-08-22 NOTE — Patient Instructions (Signed)
Vincent Ayala , Thank you for taking time to come for your Medicare Wellness Visit. I appreciate your ongoing commitment to your health goals. Please review the following plan we discussed and let me know if I can assist you in the future.   Screening recommendations/referrals: Colonoscopy: Currently due. Will call if interested in order.  Recommended yearly ophthalmology/optometry visit for glaucoma screening and checkup Recommended yearly dental visit for hygiene and checkup  Vaccinations: Influenza vaccine: Due fall 2021 Pneumococcal vaccine: Completed series Tdap vaccine: Currently due, declined at this time.  Shingles vaccine: Shingrix discussed. Please contact your pharmacy for coverage information.     Advanced directives: Caregiver is unsure if form has been completed.  Conditions/risks identified: Fall risk prevention discussed. Recommend to increase water intake to 6-8 8 oz glasses a day.   Next appointment: 01/09/21 @ 10:40 AM with Dr Rosanna Randy. Declined scheduling an AWV for 2022 at this time.   Preventive Care 38 Years and Older, Male Preventive care refers to lifestyle choices and visits with your health care provider that can promote health and wellness. @ 19:40 AM What does preventive care include?  A yearly physical exam. This is also called an annual well check.  Dental exams once or twice a year.  Routine eye exams. Ask your health care provider how often you should have your eyes checked.  Personal lifestyle choices, including:  Daily care of your teeth and gums.  Regular physical activity.  Eating a healthy diet.  Avoiding tobacco and drug use.  Limiting alcohol use.  Practicing safe sex.  Taking low doses of aspirin every day.  Taking vitamin and mineral supplements as recommended by your health care provider. What happens during an annual well check? The services and screenings done by your health care provider during your annual well check will depend on  your age, overall health, lifestyle risk factors, and family history of disease. Counseling  Your health care provider may ask you questions about your:  Alcohol use.  Tobacco use.  Drug use.  Emotional well-being.  Home and relationship well-being.  Sexual activity.  Eating habits.  History of falls.  Memory and ability to understand (cognition).  Work and work Statistician. Screening  You may have the following tests or measurements:  Height, weight, and BMI.  Blood pressure.  Lipid and cholesterol levels. These may be checked every 5 years, or more frequently if you are over 36 years old.  Skin check.  Lung cancer screening. You may have this screening every year starting at age 59 if you have a 30-pack-year history of smoking and currently smoke or have quit within the past 15 years.  Fecal occult blood test (FOBT) of the stool. You may have this test every year starting at age 62.  Flexible sigmoidoscopy or colonoscopy. You may have a sigmoidoscopy every 5 years or a colonoscopy every 10 years starting at age 17.  Prostate cancer screening. Recommendations will vary depending on your family history and other risks.  Hepatitis C blood test.  Hepatitis B blood test.  Sexually transmitted disease (STD) testing.  Diabetes screening. This is done by checking your blood sugar (glucose) after you have not eaten for a while (fasting). You may have this done every 1-3 years.  Abdominal aortic aneurysm (AAA) screening. You may need this if you are a current or former smoker.  Osteoporosis. You may be screened starting at age 32 if you are at high risk. Talk with your health care provider about your  test results, treatment options, and if necessary, the need for more tests. Vaccines  Your health care provider may recommend certain vaccines, such as:  Influenza vaccine. This is recommended every year.  Tetanus, diphtheria, and acellular pertussis (Tdap, Td) vaccine.  You may need a Td booster every 10 years.  Zoster vaccine. You may need this after age 45.  Pneumococcal 13-valent conjugate (PCV13) vaccine. One dose is recommended after age 45.  Pneumococcal polysaccharide (PPSV23) vaccine. One dose is recommended after age 69. Talk to your health care provider about which screenings and vaccines you need and how often you need them. This information is not intended to replace advice given to you by your health care provider. Make sure you discuss any questions you have with your health care provider. Document Released: 12/28/2015 Document Revised: 08/20/2016 Document Reviewed: 10/02/2015 Elsevier Interactive Patient Education  2017 [place] Prevention in the Home Falls can cause injuries. They can happen to people of all ages. There are many things you can do to make your home safe and to help prevent falls. What can I do on the outside of my home?  Regularly fix the edges of walkways and driveways and fix any cracks.  Remove anything that might make you trip as you walk through a door, such as a raised step or threshold.  Trim any bushes or trees on the path to your home.  Use bright outdoor lighting.  Clear any walking paths of anything that might make someone trip, such as rocks or tools.  Regularly check to see if handrails are loose or broken. Make sure that both sides of any steps have handrails.  Any raised decks and porches should have guardrails on the edges.  Have any leaves, snow, or ice cleared regularly.  Use sand or salt on walking paths during winter.  Clean up any spills in your garage right away. This includes oil or grease spills. What can I do in the bathroom?  Use night lights.  Install grab bars by the toilet and in the tub and shower. Do not use towel bars as grab bars.  Use non-skid mats or decals in the tub or shower.  If you need to sit down in the shower, use a plastic, non-slip stool.  Keep the  floor dry. Clean up any water that spills on the floor as soon as it happens.  Remove soap buildup in the tub or shower regularly.  Attach bath mats securely with double-sided non-slip rug tape.  Do not have throw rugs and other things on the floor that can make you trip. What can I do in the bedroom?  Use night lights.  Make sure that you have a light by your bed that is easy to reach.  Do not use any sheets or blankets that are too big for your bed. They should not hang down onto the floor.  Have a firm chair that has side arms. You can use this for support while you get dressed.  Do not have throw rugs and other things on the floor that can make you trip. What can I do in the kitchen?  Clean up any spills right away.  Avoid walking on wet floors.  Keep items that you use a lot in easy-to-reach places.  If you need to reach something above you, use a strong step stool that has a grab bar.  Keep electrical cords out of the way.  Do not use floor polish or wax that  makes floors slippery. If you must use wax, use non-skid floor wax.  Do not have throw rugs and other things on the floor that can make you trip. What can I do with my stairs?  Do not leave any items on the stairs.  Make sure that there are handrails on both sides of the stairs and use them. Fix handrails that are broken or loose. Make sure that handrails are as long as the stairways.  Check any carpeting to make sure that it is firmly attached to the stairs. Fix any carpet that is loose or worn.  Avoid having throw rugs at the top or bottom of the stairs. If you do have throw rugs, attach them to the floor with carpet tape.  Make sure that you have a light switch at the top of the stairs and the bottom of the stairs. If you do not have them, ask someone to add them for you. What else can I do to help prevent falls?  Wear shoes that:  Do not have high heels.  Have rubber bottoms.  Are comfortable and fit  you well.  Are closed at the toe. Do not wear sandals.  If you use a stepladder:  Make sure that it is fully opened. Do not climb a closed stepladder.  Make sure that both sides of the stepladder are locked into place.  Ask someone to hold it for you, if possible.  Clearly mark and make sure that you can see:  Any grab bars or handrails.  First and last steps.  Where the edge of each step is.  Use tools that help you move around (mobility aids) if they are needed. These include:  Canes.  Walkers.  Scooters.  Crutches.  Turn on the lights when you go into a dark area. Replace any light bulbs as soon as they burn out.  Set up your furniture so you have a clear path. Avoid moving your furniture around.  If any of your floors are uneven, fix them.  If there are any pets around you, be aware of where they are.  Review your medicines with your doctor. Some medicines can make you feel dizzy. This can increase your chance of falling. Ask your doctor what other things that you can do to help prevent falls. This information is not intended to replace advice given to you by your health care provider. Make sure you discuss any questions you have with your health care provider. Document Released: 09/27/2009 Document Revised: 05/08/2016 Document Reviewed: 01/05/2015 Elsevier Interactive Patient Education  2017 [employer].

## 2020-08-23 DIAGNOSIS — Z9181 History of falling: Secondary | ICD-10-CM | POA: Diagnosis not present

## 2020-08-23 DIAGNOSIS — G3109 Other frontotemporal dementia: Secondary | ICD-10-CM | POA: Diagnosis not present

## 2020-08-23 DIAGNOSIS — M069 Rheumatoid arthritis, unspecified: Secondary | ICD-10-CM | POA: Diagnosis not present

## 2020-08-23 DIAGNOSIS — S42202D Unspecified fracture of upper end of left humerus, subsequent encounter for fracture with routine healing: Secondary | ICD-10-CM | POA: Diagnosis not present

## 2020-08-23 DIAGNOSIS — I10 Essential (primary) hypertension: Secondary | ICD-10-CM | POA: Diagnosis not present

## 2020-08-23 DIAGNOSIS — F0280 Dementia in other diseases classified elsewhere without behavioral disturbance: Secondary | ICD-10-CM | POA: Diagnosis not present

## 2020-08-23 DIAGNOSIS — M1990 Unspecified osteoarthritis, unspecified site: Secondary | ICD-10-CM | POA: Diagnosis not present

## 2020-08-23 DIAGNOSIS — F329 Major depressive disorder, single episode, unspecified: Secondary | ICD-10-CM | POA: Diagnosis not present

## 2020-09-10 ENCOUNTER — Telehealth: Payer: Self-pay

## 2020-09-10 NOTE — Telephone Encounter (Signed)
205 pm.  Phone call made to [name] to follow up on patient status.  No answer but message has been left requesting call back.  PLAN:  Awaiting call back.  If no call back, I will contact son again in October.

## 2020-09-17 ENCOUNTER — Other Ambulatory Visit: Payer: Self-pay | Admitting: Family Medicine

## 2020-10-05 ENCOUNTER — Telehealth: Payer: Self-pay

## 2020-10-05 NOTE — Telephone Encounter (Signed)
1135 am.  Phone call made to son [name] for monthly outreach.  No answer on cell phone but I have left a message requesting he call back.

## 2020-10-30 ENCOUNTER — Telehealth: Payer: Self-pay

## 2020-10-30 NOTE — Progress Notes (Signed)
1020AM: Palliative care SW outreached [facility] [phone] Mervyn Skeeters, to inquire of their services for patient. SW LVM Awaiting return call.

## 2020-10-30 NOTE — Telephone Encounter (Signed)
10:10AM: Palliative care SW outreached patient to complete telephonic visit.   Palliative care SW outreached patient to complete telephonic visit. Patient's son, [name], provided update on medical condition and/or changes. Son shared that patient has been doing well, no falls recently and shoulder is still healing well. Patient eating well no weight loss and sleeps okay. Son stated that he is still in concern about patient and patient's wife living independently due to their dementia. Son has looked into Medicaid for both and was denied due to income. Son states that placement in an ALF would be ideal but patient and wife cannot afford to pay privately and he is being told they would not be approved for [place] Medicaid.  Patient has private caregivers during the day (8AM-noon and 6pm-10pm) through paid for by son, but son states that he cannot continue to pay for these services long term. Patient currently has RN with [facility] coming into the home as well. SW will Human resources officer for possible assistance. No other psychosocial needs. Son appreciative of telephonic check in.   Plan: Palliative care will continue to monitor and assist with long term care planning as needed.

## 2020-11-02 NOTE — Progress Notes (Signed)
3:30PM: Palliative care SW outreached [facility] senior care navigator services to possibly assist son LTC planning for patient and wife.  Awaiting a return call.

## 2020-11-23 ENCOUNTER — Telehealth: Payer: Self-pay

## 2020-11-23 NOTE — Telephone Encounter (Signed)
950 am.  Telephonic visit completed with patient's wife.  She reports he has been doing very well.  He is currently sleeping.  Wife notes that patient does sleep a lot but has no issues with insomnia.  Po intake has been very good and she states patient eats all the time.  No falls are reported.  No issues with pain.  Patient is having regular bowel movements.  No behavioral issues or sadness noted by wife.  No new medications or recent changes per wife.  Wife reiterates that she and patient are both doing quite well at this time.  She appreciates the follow up call and denies any needs or concerns.  Advised that the [facility] team would follow up again next month.

## 2020-12-28 ENCOUNTER — Telehealth: Payer: Self-pay

## 2020-12-28 NOTE — Telephone Encounter (Signed)
1:30PM: Palliative care SW outreached patient/family for monthly telephonic visit.  SW left HIPPA complaint VM. Awaiting return call.  Will continue to offer palliative care support. 

## 2021-01-09 ENCOUNTER — Ambulatory Visit: Payer: Self-pay | Admitting: Family Medicine

## 2021-01-09 NOTE — Progress Notes (Deleted)
Established patient visit   Patient: Vincent Ayala. Vincent Ayala   DOB: 05-04-1946   75 y.o. Male  MRN: [id number] Visit Date: 01/09/2021  Today's healthcare provider: Wilhemena Durie, MD   No chief complaint on file.  Subjective    HPI  Hypertension, follow-up  BP Readings from Last 3 Encounters:  07/11/20 (!) 120/60  06/07/20 (!) 142/90  08/17/19 130/66   Wt Readings from Last 3 Encounters:  07/11/20 (!) 278 lb (126.1 kg)  06/07/20 283 lb 3.2 oz (128.5 kg)  08/16/19 265 lb (120.2 kg)     He was last seen for hypertension 6 months ago.  BP at that visit was 120/60. Management since that visit includes; on losartan. He reports {excellent/good/fair/poor:19665} compliance with treatment. He {is/is not:9024} having side effects. {document side effects if present:1} He {is/is not:9024} exercising. He {is/is not:9024} adherent to low salt diet.   Outside blood pressures are {enter patient reported home BP, or 'not being checked':1}.  He {does/does not:200015} smoke.  Use of agents associated with hypertension: {bp agents assoc with hypertension:511::"none"}.   ---------------------------------------------------------------------------------------------------   {Show patient history (optional):23778::" "}   Medications: Outpatient Medications Prior to Visit  Medication Sig  . bisacodyl (DULCOLAX) 5 MG EC tablet Take 2 tablets (10 mg total) by mouth daily as needed for moderate constipation.  . Calcium Carbonate-Vitamin D 600-200 MG-UNIT TABS Take 1 tablet by mouth daily.   . calcium-vitamin D 250-100 MG-UNIT tablet Take 1 tablet by mouth 2 (two) times daily.  . cephALEXin (KEFLEX) 500 MG capsule Take 1 capsule (500 mg total) by mouth 2 (two) times daily.  . ciprofloxacin (CIPRO) 500 MG tablet Take 1 tablet (500 mg total) by mouth 2 (two) times daily.  Marland Kitchen docusate sodium (COLACE) 100 MG capsule Take 1 capsule (100 mg total) by mouth 2 (two) times daily.  Marland Kitchen enoxaparin  (LOVENOX) 40 MG/0.4ML injection Inject 0.4 mLs (40 mg total) into the skin daily for 28 days.  Marland Kitchen gabapentin (NEURONTIN) 300 MG capsule Take 1 capsule (300 mg total) by mouth 3 (three) times daily.  Marland Kitchen lamoTRIgine (LAMICTAL) 100 MG tablet Take 1 tablet (100 mg total) by mouth 2 (two) times daily.  Marland Kitchen lamoTRIgine (LAMICTAL) 150 MG tablet Take 150 mg by mouth 2 (two) times daily.  Marland Kitchen losartan (COZAAR) 50 MG tablet TAKE 1 TABLET BY MOUTH DAILY  . Melatonin 3 MG TABS Take 6 mg by mouth at bedtime.   . memantine (NAMENDA) 10 MG tablet Take 10 mg by mouth daily.   . mirtazapine (REMERON) 15 MG tablet Take 1 tablet (15 mg total) by mouth at bedtime.  . Multiple Vitamin (MULTIVITAMIN) tablet Take 1 tablet by mouth daily.  . nitrofurantoin, macrocrystal-monohydrate, (MACROBID) 100 MG capsule Take 1 capsule (100 mg total) by mouth 2 (two) times daily.  . QUEtiapine (SEROQUEL) 50 MG tablet Take 50 mg by mouth 2 (two) times a day.  . sertraline (ZOLOFT) 100 MG tablet TAKE ONE AND ONE-HALF TABLETS DAILY  . sulfamethoxazole-trimethoprim (BACTRIM) 400-80 MG tablet Take 1 tablet by mouth 2 (two) times daily.  . traZODone (DESYREL) 50 MG tablet Take 50 mg by mouth at bedtime.    No facility-administered medications prior to visit.    Review of Systems  Constitutional: Negative for appetite change, chills and fever.  Respiratory: Negative for chest tightness, shortness of breath and wheezing.   Cardiovascular: Negative for chest pain and palpitations.  Gastrointestinal: Negative for abdominal pain, nausea and vomiting.    {  Labs  Heme  Chem  Endocrine  Serology  Results Review (optional):23779::" "}   Objective    There were no vitals taken for this visit. {Show previous vital signs (optional):23777::" "}   Physical Exam  ***  No results found for any visits on 01/09/21.  Assessment & Plan     ***  No follow-ups on file.      {provider attestation***:1}   Wilhemena Durie, MD   [facility] [phone] (phone) [phone] (fax)  [place]

## 2021-01-13 ENCOUNTER — Other Ambulatory Visit: Payer: Self-pay | Admitting: Family Medicine

## 2021-01-15 ENCOUNTER — Telehealth: Payer: Self-pay

## 2021-01-15 NOTE — Telephone Encounter (Signed)
145 pm.  Telephone call made to [facility] to schedule an in-person visit.  Visit is scheduled for next Wednesday at 130 pm.

## 2021-01-17 ENCOUNTER — Ambulatory Visit: Payer: PPO | Admitting: Family Medicine

## 2021-01-23 ENCOUNTER — Other Ambulatory Visit: Payer: PPO

## 2021-01-23 ENCOUNTER — Other Ambulatory Visit: Payer: Self-pay

## 2021-01-23 VITALS — BP 152/78 | HR 60 | Temp 97.8°F | Wt 273.8 lb

## 2021-01-23 DIAGNOSIS — Z515 Encounter for palliative care: Secondary | ICD-10-CM

## 2021-01-23 NOTE — Progress Notes (Signed)
COMMUNITY PALLIATIVE CARE SW NOTE  PATIENT NAME: Vincent Ayala. Bihm DOB: January 16, 1946 MRN: [id number]  PRIMARY CARE PROVIDER: Jerrol Ayala., MD  RESPONSIBLE PARTY:  Acct ID - Guarantor Home Phone Work Phone Relationship Acct Type  [id number] Vincent Ayala, Vincent Ayala  Self P/F     [address], [place], [place] [address]     PLAN OF CARE and INTERVENTIONS:             1. GOALS OF CARE/ ADVANCE CARE PLANNING:  Patient is DNR, DNR form is complete. Vincent Ayala has HCPOA. Patient has living will, Patients goal is to remain in the home with Vincent Ayala for as long as possible. Vincent Ayala has discussed [place] placement for patient in the future.  2. SOCIAL/EMOTIONAL/SPIRITUAL ASSESSMENT/ INTERVENTIONS:  SW and RN met with patient and patient's Vincent Ayala Vincent Ayala. Patient was sitting in recliner during visit.   Vincent Ayala updated SW and RN on medical conditions and changes. Patient shared that he eats good, no loss in appetite and weighs self during visit 273.8lb. Vincent Ayala shared that patient sleeps well and naps during the day. Vincent Ayala shared that she had a fall back in Sept 2021 and fx her L shoulder and hip.  RN reviewed medications and vitals. Patients BP reading slightly high. Patient and Vincent Ayala have automatic pill dispenser that Vincent Ayala manages and refills weekly.   Patient has private caregivers daily Vincent Ayala) 7am-noon and Vincent Ayala) 530pm-10pm. Vincent Ayala shared that Vincent Ayala takes her to doctors' appointments if needed as well due to Vincent Ayala work schedule.  SW assessed SDH and provided supportive listening and education. SW discussed goals, reviewed care plan, provided emotional support, used active and reflective listening. Palliative care will continue to monitor and assist with long term care planning as needed.  3. PATIENT/CAREGIVER EDUCATION/ COPING: Patient alert with confusion during visit. Patient has apparent memory deficits and asks Vincent Ayala and Vincent Ayala for certain details. Vincent Ayala appears has some apparent  memory deficits as well. Vincent Ayala and Vincent Ayala  live locally and are very supportive.   4. PERSONAL EMERGENCY PLAN:  Patient to call 9-1-1 for emergencies.   5. COMMUNITY RESOURCES COORDINATION/ HEALTH CARE NAVIGATION: Vincent Ayala and Vincent Ayala manages patients care. Patient has private caregiver 7 days a week 8am-noon. Ortho to order in home therapy for patient,   6. FINANCIAL/LEGAL CONCERNS/INTERVENTIONS:  None. Vincent Ayala has attempted to apply for LTC or [place] Medicaid for patient but was told they would not qualify.     SOCIAL HX:  Social History   Tobacco Use  . Smoking status: Never Smoker  . Smokeless tobacco: Never Used  Substance Use Topics  . Alcohol use: No    CODE STATUS: DNR ADVANCED DIRECTIVES: Y MOST FORM COMPLETE:  N HOSPICE EDUCATION PROVIDED: N  OIP:[id number] is ambulatory with out an AD but has RW and SPC when needed. Vincent Ayala, Vincent Ayala and personal care aid assist with ADLS if needed. Patient has some incontinent. Patient is able to feed self. Caregivers assist with meal prep.   Time spent: 45 min      [place], [place]

## 2021-01-23 NOTE — Progress Notes (Signed)
PATIENT NAME: Vincent Ayala. Buerkle DOB: Aug 21, 1946 MRN: [id number]  PRIMARY CARE PROVIDER: Jerrol Banana., MD  RESPONSIBLE PARTY:  Acct ID - Guarantor Home Phone Work Phone Relationship Acct Type  [id number] RONY, [id number]  Self P/F     [address], [place], [place] [address]    PLAN OF CARE and INTERVENTIONS:               1.  GOALS OF CARE/ ADVANCE CARE PLANNING:  Remain in the home with private caregivers and assistance from son.               2.  PATIENT/CAREGIVER EDUCATION:  Medication compliance.               4. PERSONAL EMERGENCY PLAN:  Activate 911 for emergencies.               5.  DISEASE STATUS:  Joint visit with [place], SW.  Patient is found napping in his chair.  He awakens easily to verbal stimulation.     Patient is able to answer questions correctly with wife assisting.  Patient remains independent with ADL's.  He is showering and dressing on his own.  He has private sitters in the home from 8 am-12 pm and 5-10 pm daily for supervision.  Son-Mike is completing pill boxes weekly.  Patient has an automatic pill dispenser.  Patient is compliant with medication regimen.  Education provided on importance of continuing to take medications as directed.  No recent falls are noted.  Patient is ambulating without assistive devices.  His gait is steady.  Patient typically stays in the living room where he watches television most of the day.  Patient states he enjoys watching sports.  No issues with appetite are reported.  Patient states he is eating well.  Sitter is assisting with am meals.  Wife states she is doing most of the cooking.  Patient has multiple cups beside his chair.  Private caregivers are keeping liquids within reach.    HISTORY OF PRESENT ILLNESS:  75 year old male with Dementia.  Patient is being followed by Palliative Care monthly and PRN.  CODE STATUS: Full ADVANCED DIRECTIVES: No MOST FORM: No PPS: 50%   PHYSICAL EXAM:   VITALS:  Temp 97.8 F, BP 152/78 P 60 R18 O2 sats 96% LUNGS: CTA no shortness of breath CARDIAC: regular EXTREMITIES: no edema present to bilateral lower extremities. SKIN: warm and dry to touch.  No skin breakdown reported. NEURO:  Alert and oriented to self and place.  Time 130 pm- 220 pm       Lorenza Burton, RN

## 2021-02-20 DIAGNOSIS — G3109 Other frontotemporal dementia: Secondary | ICD-10-CM | POA: Diagnosis not present

## 2021-02-20 DIAGNOSIS — F0280 Dementia in other diseases classified elsewhere without behavioral disturbance: Secondary | ICD-10-CM | POA: Diagnosis not present

## 2021-03-08 ENCOUNTER — Telehealth: Payer: Self-pay

## 2021-03-08 NOTE — Telephone Encounter (Signed)
12:03PM: Palliative care SW outreached patient to complete telephonic visit.   Palliative care SW outreached patient to complete telephonic visit.   Patient's wife, [name], provided update on medical condition and/or changes. Patient currently napping. Wife shared that patient has not had any changes since last visit, she shares that patient is doing well overall.  Wife denies patient complaining of any pain.  No recent falls.  Wife shares that patient's appetite is the same and that he eats very well.   Patient's neurologist has ordered [facility] PT to assist with ambulation and training on walker. Wife denies financial, food insecurities or issues with transportation. No other psychosocial needs. Patient appreciative of telephonic check in.   Palliative care will continue to monitor and assist with long term care planning as needed.   In person visit scheduled for 4/14 @1pm .

## 2021-03-28 ENCOUNTER — Other Ambulatory Visit: Payer: Self-pay

## 2021-03-28 ENCOUNTER — Other Ambulatory Visit: Payer: PPO

## 2021-03-28 VITALS — BP 122/64 | HR 67 | Temp 97.5°F | Resp 18 | Wt 271.0 lb

## 2021-03-28 DIAGNOSIS — Z515 Encounter for palliative care: Secondary | ICD-10-CM

## 2021-03-28 NOTE — Progress Notes (Signed)
COMMUNITY PALLIATIVE CARE SW NOTE  PATIENT NAME: Vincent Ayala. Motton DOB: 1946/02/06 MRN: [id number]  PRIMARY CARE PROVIDER: Jerrol Banana., MD  RESPONSIBLE PARTY:  Acct ID - Guarantor Home Phone Work Phone Relationship Acct Type  [id number] EADEN, [id number]  Self P/F     [address], [place], [place] [address]     PLAN OF CARE and INTERVENTIONS:             GOALS OF CARE/ ADVANCE CARE PLANNING:  Patient is DNR, DNR form is complete. Son has HCPOA. Patient has living will, Patients goal is to remain in the home with wife for as long as possible. Son has discussed [facility] placement for patient in the future.   2.         SOCIAL/EMOTIONAL/SPIRITUAL ASSESSMENT/ INTERVENTIONS:  SW and RN met with patient and patient's wife [name]. Patient was sitting in recliner during visit.    Wife updated SW and RN on medical conditions and changes. Patient shared that he eats good, no loss in appetite and weighs self during visit 271lb. No recent falls. No pain reported. Patient sleeps well. Patient has frequent incontinence episodes.  Patient does forget when he has ate or what he has ate. Wife shared that patient sleeps well and naps during the day. Patients caregivers writes their daily activities in a journal everyday. Patients daughter came to visit last weekend, daughter documented in journal that patient seem to get agitated during her visit, she feels that her presence makes him a bit agitated. No other signs of this behavior documented in patients daily activity journal by caregivers.   RN reviewed medications and vitals. Patient and wife have automatic pill dispenser that son manages and refills weekly.   Neuro ordered [facility] PT in March for safety use of RW. Per spouse HH has not started. TC made to [facility] to follow up on referral. Awaiting return call. Patient is currently not utilizing any form of AD to assist with mobility. Patient has SPC and RW. Patient and wife goes for walks  outside, but does not use AD. Safety measures discussed with patient and spouse to utilize AD.   Patient continues to have private caregivers daily [name]) 7am-noon and [name]) 530pm-10pm. Wife shared that [name] takes her to doctors' appointments if needed as well due to son work schedule.  Patient continues to have memory deficits and cognitive decline.    SW assessed SDH and provided supportive listening and education. SW discussed goals, reviewed care plan, provided emotional support, used active and reflective listening. Palliative care will continue to monitor and assist with long term care planning as needed.   3.         PATIENT/CAREGIVER EDUCATION/ COPING: Patient alert with confusion during visit. Patient has apparent memory deficits and asks wife and son for certain details. Wife appears has some apparent  memory deficits as well. Son and DIL live locally and are very supportive.    4.         PERSONAL EMERGENCY PLAN:  Patient to call 9-1-1 for emergencies.    5.         COMMUNITY RESOURCES COORDINATION/ HEALTH CARE NAVIGATION: Son and wife manages patients care. Patient has private caregiver 7 days a week 8am-noon. Ortho to order in home therapy for patient,    6.         FINANCIAL/LEGAL CONCERNS/INTERVENTIONS:  None. Son has attempted to apply for LTC or [place] Medicaid for patient but was  told they would not qualify.        SOCIAL HX:  Social History   Tobacco Use  . Smoking status: Never Smoker  . Smokeless tobacco: Never Used  Substance Use Topics  . Alcohol use: No    CODE STATUS: DNR ADVANCED DIRECTIVES: Y MOST FORM COMPLETE:  TBD HOSPICE EDUCATION PROVIDED: N  PPS: Patient is MIN-MOD A with ADL's such as bathing and toileting. Patient is able to feedself.    Time spent: 45 min.         [name], [place]

## 2021-03-28 NOTE — Progress Notes (Signed)
PATIENT NAME: Vincent Ayala. Bognar DOB: June 26, 1946 MRN: [id number]  PRIMARY CARE PROVIDER: Jerrol Banana., MD  RESPONSIBLE PARTY:  Acct ID - Guarantor Home Phone Work Phone Relationship Acct Type  [id number] KARA, [phone]  Self P/F     [address], [place], [place] [address]    PLAN OF CARE and INTERVENTIONS:               1.  GOALS OF CARE/ ADVANCE CARE PLANNING:  Remain home with the assistance of his wife and son.               2.  PATIENT/CAREGIVER EDUCATION:  Safety               4. PERSONAL EMERGENCY PLAN:  Activate 911 for emergencies.               5.  DISEASE STATUS:  Joint visit completed with [place], SW, wife [name] and patient.  Patient and wife engaging in conversation.  Wife notes daughter was here over the weekend.  Wife is repetitive in conversation.  Caregivers are coming 2 x daily.  A notebook is being kept for the caregivers documenting daily events. Reviewed notes from the week and daughter noted patient was yelling on the 2nd day of her visit.  No other documentation is noted with regards to yelling.  Patient is sleeping well.  No issues with insomnia are reported.  Patient continues napping in the morning and afternoon.  Wife notes patient is eating well.  Weight obtained and there is a 2 lb weight loss.  Patient does not recall eating lunch prior to our visit but wife confirms they have eaten chicken salad sandwiches.  Reviewed last telemedicine visit.  PT orders noted but wife and patient deny therapy starting.  SW contacted [facility] to follow up.  Response pending.  Patient is not using any assistive devices with ambulation.  No falls are reported.  Increase urinary incontinence noted in caregiver notes.  This is consistent during the nighttime hours and depends are being worn daily.  Patient denies shortness of breath, chest pain or nausea/vomiting.  Patient believes he is having regular bowel movements.  Patient is walking in the neighborhood  with his wife.  I have encouraged them to use a walker or cane for safety.  Wife states patient has a shuffling gait.  She had him ambulate in the living room but patient took normal strides and gait was steady.  Patient states when he walks for long distances he tends to have a shuffling gait.  Again, re-enforced use of assistive devices for safety.    HISTORY OF PRESENT ILLNESS:  75 year old male with Dementia.  Patient is being followed by Palliative Care monthly and PRN.  CODE STATUS: DNR form on the fridge ADVANCED DIRECTIVES: No MOST FORM: No PPS: 50%   PHYSICAL EXAM:   VITALS: Today's Vitals   03/28/21 1323  BP: 122/64  Pulse: 67  Resp: 18  Temp: (!) 97.5 F (36.4 C)  SpO2: 93%  Weight: 271 lb (122.9 kg)  PainSc: 0-No pain    LUNGS: CTA CARDIAC: HRR EXTREMITIES: - edema SKIN: warm and dry to touch.  No skin breakdown noted. NEURO: Alert and oriented to person and place.  +forgetfulness.       Lorenza Burton, RN

## 2021-05-07 ENCOUNTER — Telehealth: Payer: PPO

## 2021-05-07 ENCOUNTER — Other Ambulatory Visit: Payer: Self-pay

## 2021-05-07 DIAGNOSIS — Z515 Encounter for palliative care: Secondary | ICD-10-CM | POA: Diagnosis not present

## 2021-05-07 NOTE — Progress Notes (Signed)
1:30PM: Palliative care SW outreached patient to complete telephonic visit.   Palliative care SW outreached patient to complete telephonic visit.   Patient's wife, [name], provided update on medical condition and/or changes. Patient currently napping. Wife shared that patient has not had any changes since last visit, she shares that patient is doing well overall.  Wife denies patient complaining of any pain.  No recent falls.  Wife shares that patient's appetite is the same and that he eats very well.   Wife denies financial, food insecurities or issues with transportation. No other psychosocial needs. Patient appreciative of telephonic check in.   Palliative care will continue to monitor and assist with long term care planning as needed.   In person visit scheduled for 6/3 @1130am .

## 2021-05-17 ENCOUNTER — Other Ambulatory Visit: Payer: PPO

## 2021-05-17 ENCOUNTER — Other Ambulatory Visit: Payer: Self-pay

## 2021-05-23 ENCOUNTER — Other Ambulatory Visit: Payer: PPO

## 2021-05-23 ENCOUNTER — Other Ambulatory Visit: Payer: Self-pay

## 2021-05-23 VITALS — BP 122/74 | HR 70 | Temp 97.4°F | Resp 18

## 2021-05-23 DIAGNOSIS — Z515 Encounter for palliative care: Secondary | ICD-10-CM

## 2021-05-23 NOTE — Progress Notes (Signed)
COMMUNITY PALLIATIVE CARE SW NOTE  PATIENT NAME: Vincent Ayala. Yonker DOB: April 13, 1946 MRN: [id number]  PRIMARY CARE PROVIDER: Jerrol Ayala., MD  RESPONSIBLE PARTY:  Acct ID - Guarantor Home Phone Work Phone Relationship Acct Type  [id number] Vincent Ayala, [phone]  Self P/F     [address], [place], [place] [address]     PLAN OF CARE and INTERVENTIONS:             GOALS OF CARE/ ADVANCE CARE PLANNING:  :  Patient is DNR, DNR form is complete. Son has HCPOA. Patient has living will, Patients goal is to remain in the home with wife for as long as possible. Son has discussed [facility] placement for patient in the future.   2.         SOCIAL/EMOTIONAL/SPIRITUAL ASSESSMENT/ INTERVENTIONS:  SW and RN met with patient and patient's wife Vincent Ayala. Patient was sitting in recliner during visit.   Wife updated SW and RN on medical conditions and changes. Patient shared that he eats good, no loss in appetite. No recent falls. No pain reported. Patient sleeps well. Patient has frequent incontinence episodes. Patient does forget when he has ate or what he has ate. Wife shared that patient sleeps well and naps during the day. Patients caregivers writes their daily activities in a journal everyday. Patients daughter came to visit last weekend, no behaviors documented in caregiver daily journal.     RN reviewed medications and vitals. Patient and wife have automatic pill dispenser that son manages and refills weekly.    Patient continues to have private caregivers daily Vincent Ayala) 7am-noon and Vincent Ayala) 530pm-10pm. Wife shared that Vincent Ayala takes her to doctors' appointments if needed as well due to son work schedule.   Patient continues to have memory deficits and cognitive decline.    SW assessed SDH and provided supportive listening and education. SW discussed goals, reviewed care plan, provided emotional support, used active and reflective listening. Palliative care will continue to monitor and assist  with long term care planning as needed.   3.         PATIENT/CAREGIVER EDUCATION/ COPING: Patient alert with confusion during visit. Patient has apparent memory deficits and asks wife and son for certain details. Wife appears has some apparent  memory deficits as well. Son and DIL live locally and are very supportive.   4.         PERSONAL EMERGENCY PLAN:  Patient to call 9-1-1 for emergencies.   5.         COMMUNITY RESOURCES COORDINATION/ HEALTH CARE NAVIGATION: Son and wife manages patients care. Patient has private caregiver 7 days a week 8am-noon. Ortho to order in home therapy for patient,   6.         FINANCIAL/LEGAL CONCERNS/INTERVENTIONS:  None. Son has attempted to apply for [place]TC or [place] Medicaid for patient but was told they would not qualify.       SOCIAL HX:  Social History   Tobacco Use   Smoking status: Never   Smokeless tobacco: Never  Substance Use Topics   Alcohol use: No    CODE STATUS: DNR ADVANCED DIRECTIVES: Y MOST FORM COMPLETE:  Y HOSPICE EDUCATION PROVIDED: N  KAJ:[id number] is min A with ADL's  Time spent: 3 min      Vincent Ayala, Vincent Ayala

## 2021-05-23 NOTE — Progress Notes (Signed)
PATIENT NAME: Vincent Ayala. [place] DOB: February 06, 1946 MRN: [id number]  PRIMARY CARE PROVIDER: Jerrol Banana., MD  RESPONSIBLE PARTY:  Acct ID - Guarantor Home Phone Work Phone Relationship Acct Type  [id number] KONOR, [phone]  Self P/F     [address], [place], [place] [address]    PLAN OF CARE and INTERVENTIONS:               1.  GOALS OF CARE/ ADVANCE CARE PLANNING:  Remain home with hired caregivers and wife assisting with care.               2.  PATIENT/CAREGIVER EDUCATION:  Encouraged wife and patient not to go out without assistance of caregivers.               4. PERSONAL EMERGENCY PLAN:  Activate 911 for emergencies.               5.  DISEASE STATUS:  Joint visit completed with [place], SW, wife-[name] and patient.  Patient is completing lunch on my arrival.  Patient and wife report no changes with po intake.  Patient is ambulatory without assistive devices.  No falls are reported.  Patient refuses to use any assistive devices.  Patient has walked outside with caregivers.  Reviewed notebooks from caregivers.  Patient is incontinent of bladder.  He has been refusing showers this week but caregiver was able to persuade patient to shower this am.  Caregivers are coming in the am/pm for a total of 7 hours a day 24/7.  They are doing light housekeeping, meal preparations and supervisor with ADL's.  Aide noted strong urine earlier this week with soiled linens.  Soda consumption has decreased this week and patient is encouraged to drink more water.  Wife states she has limited soda to 2 cans a day.  She keeps water beside patient.  Caregiver noted a coughing spell patient had in the last 2 weeks.  Patient was eating a meal while reclined.  Education provided on importance of sitting upright while eating.  No further coughing spells documented.   Pill box continues to be used and son is filling pill box weekly. No refills needed at this time.  Patient and wife deny any new  concerns.  Wife called son [name] to see if there were any concerns he had for Palliative Care team.  No concerns from son.     HISTORY OF PRESENT ILLNESS:  75 year old male with Dementia.  Patient is being followed by Palliative Care monthly and PRN.  CODE STATUS: DNR ADVANCED DIRECTIVES: No MOST FORM: No PPS: 50%   PHYSICAL EXAM:   VITALS: Today's Vitals   05/23/21 1303  Temp: (!) 97.4 F (36.3 C)  PainSc: 0-No pain    LUNGS: CTA CARDIAC: Cor RRR}  EXTREMITIES: - for edema SKIN: Skin color, texture, turgor normal. No rashes or lesions or mobility and turgor normal, no edema, and temperature normal  NEURO: positive for memory problems       Lorenza Burton, RN

## 2021-05-24 ENCOUNTER — Other Ambulatory Visit: Payer: Self-pay | Admitting: Family Medicine

## 2021-05-24 NOTE — Telephone Encounter (Signed)
Requested medication (s) are due for refill today: yes  Requested medication (s) are on the active medication list:  yes   Last refill:04/11/2021  Future visit scheduled:no  Notes to clinic:  overdue for 6 month follow up     Requested Prescriptions  Pending Prescriptions Disp Refills   losartan (COZAAR) 50 MG tablet [Pharmacy Med Name: LOSARTAN POTASSIUM 50 MG TAB] 30 tablet 12    Sig: TAKE 1 TABLET BY MOUTH DAILY      Cardiovascular:  Angiotensin Receptor Blockers Failed - 05/24/2021  9:58 AM      Failed - Cr in normal range and within 180 days    Creat  Date Value Ref Range Status  10/07/2017 1.39 (H) 0.70 - 1.18 mg/dL Final    Comment:    For patients >36 years of age, the reference limit for Creatinine is approximately 13% higher for people identified as African-American. .    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

## 2021-06-26 IMAGING — CR DG HIP (WITH OR WITHOUT PELVIS) 2-3V RIGHT
4 series · 4 of 4 positions shown · non-contrast
Comparison: None.

CLINICAL DATA: Right hip pain after fall today.

EXAM:
DG HIP (WITH OR WITHOUT PELVIS) 2-3V RIGHT

[pelvis ap (1 of 2)]
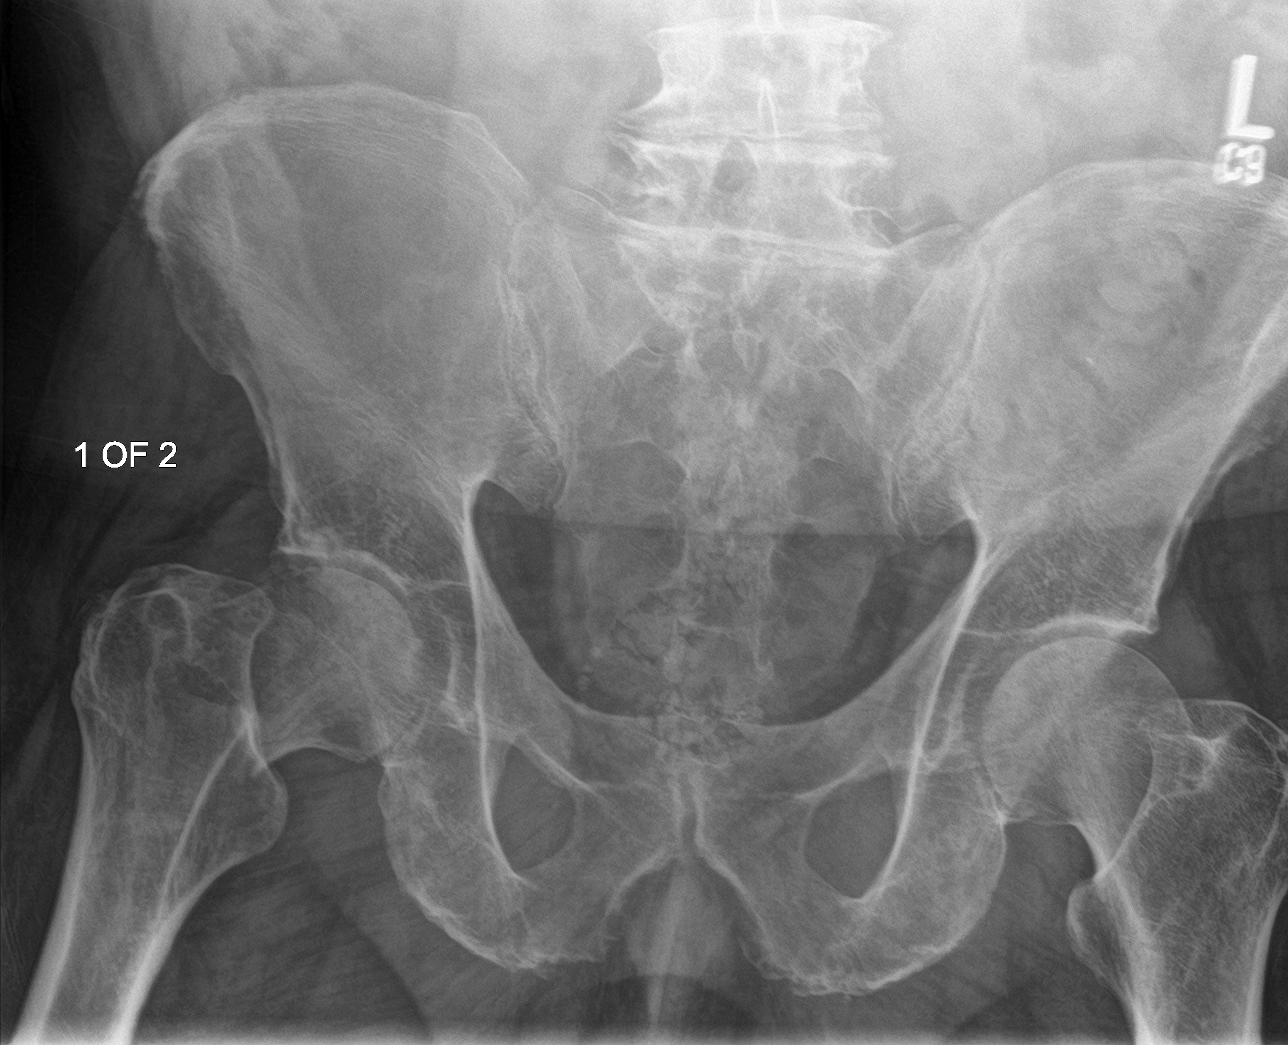

[hip ap]
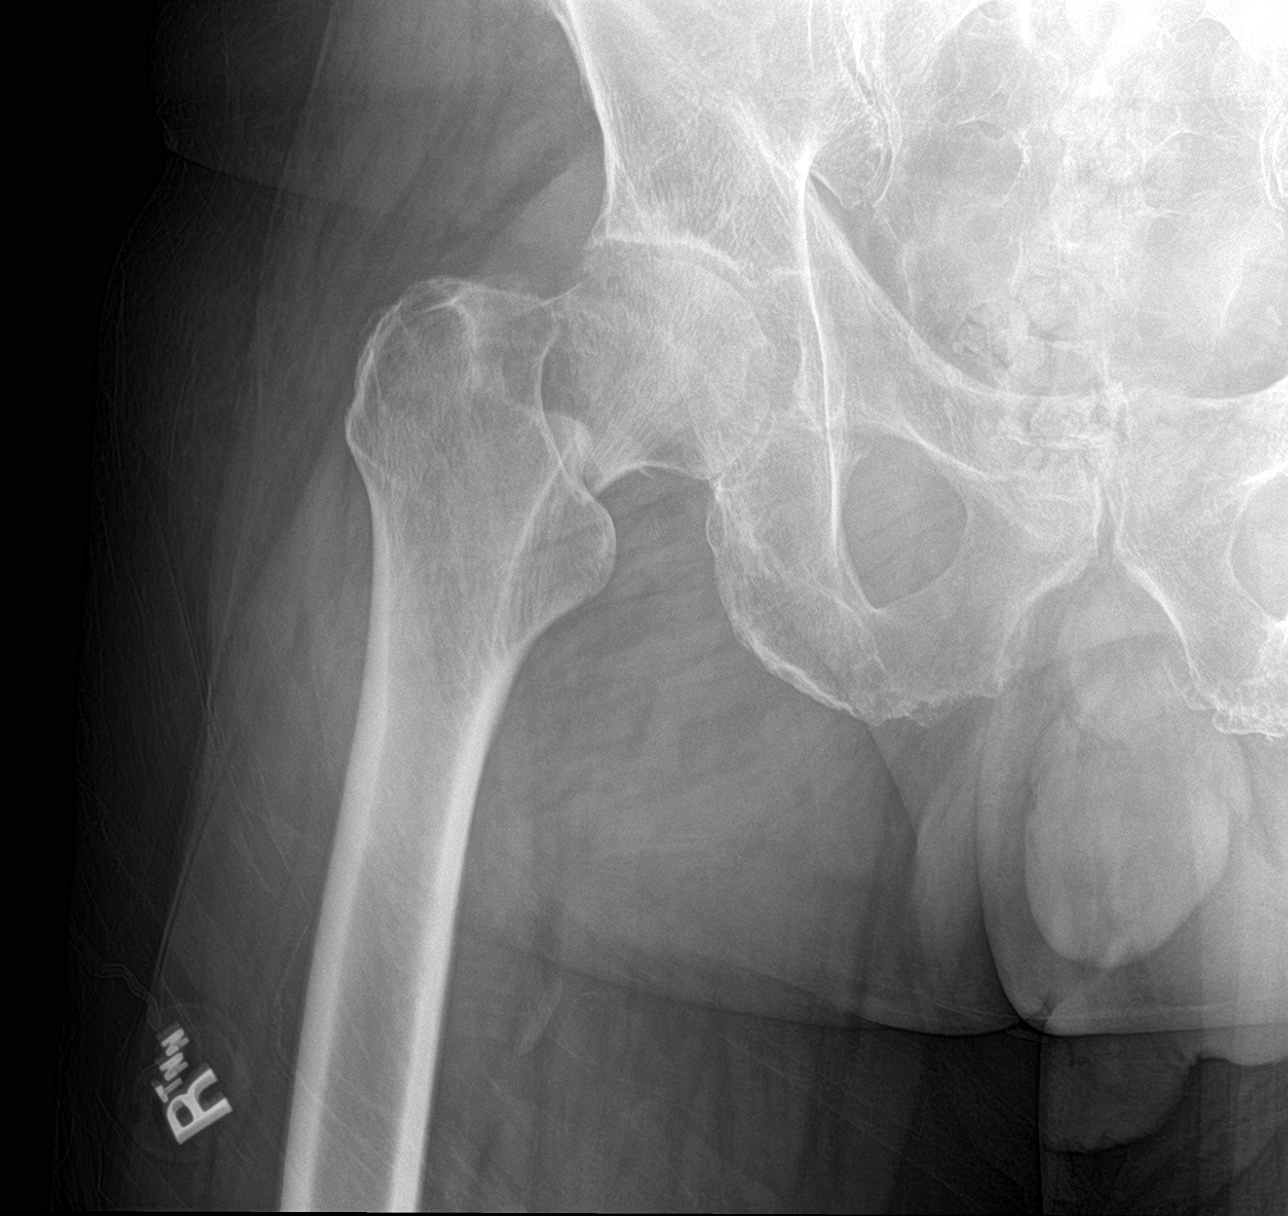

[hip lat]
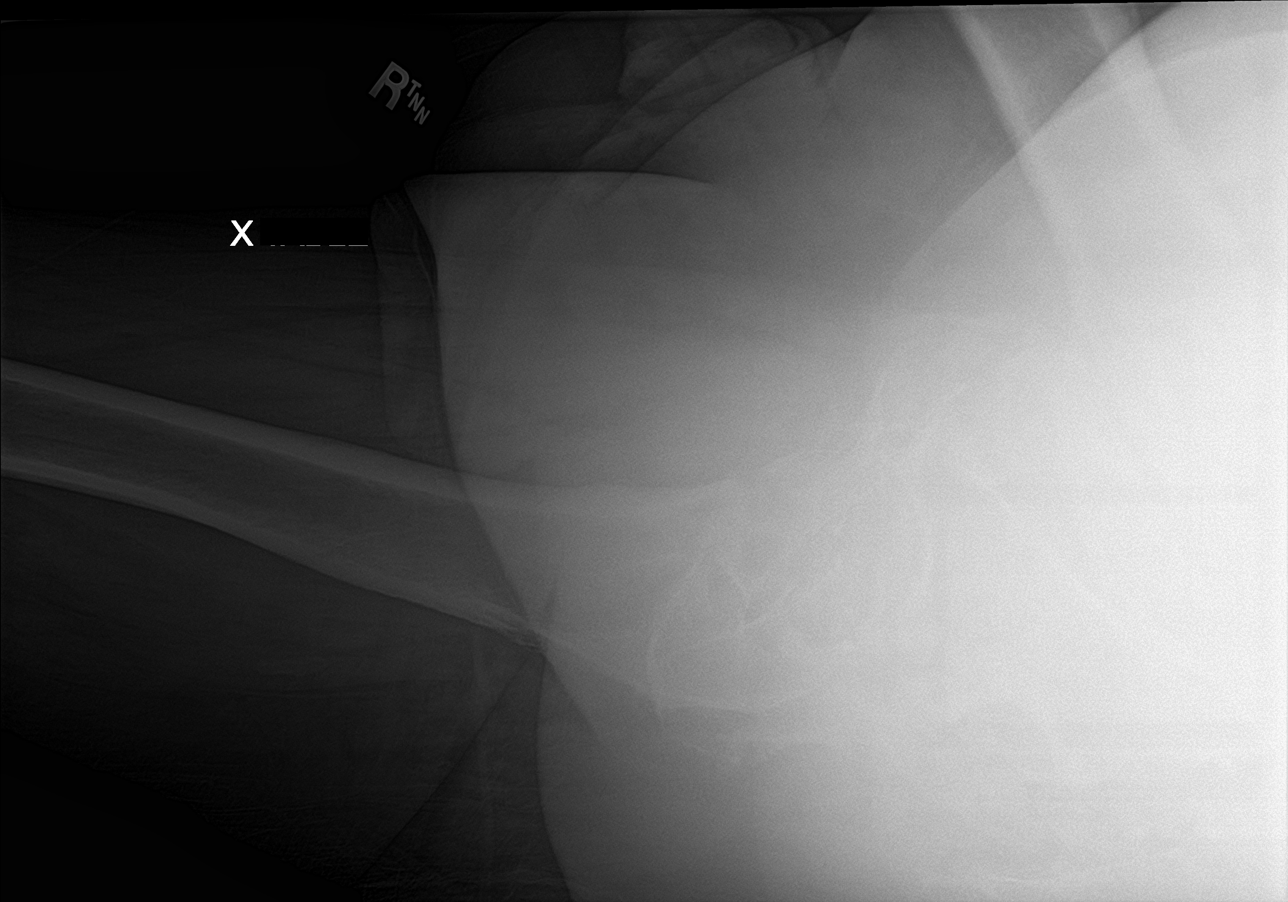

[pelvis ap (2 of 2)]
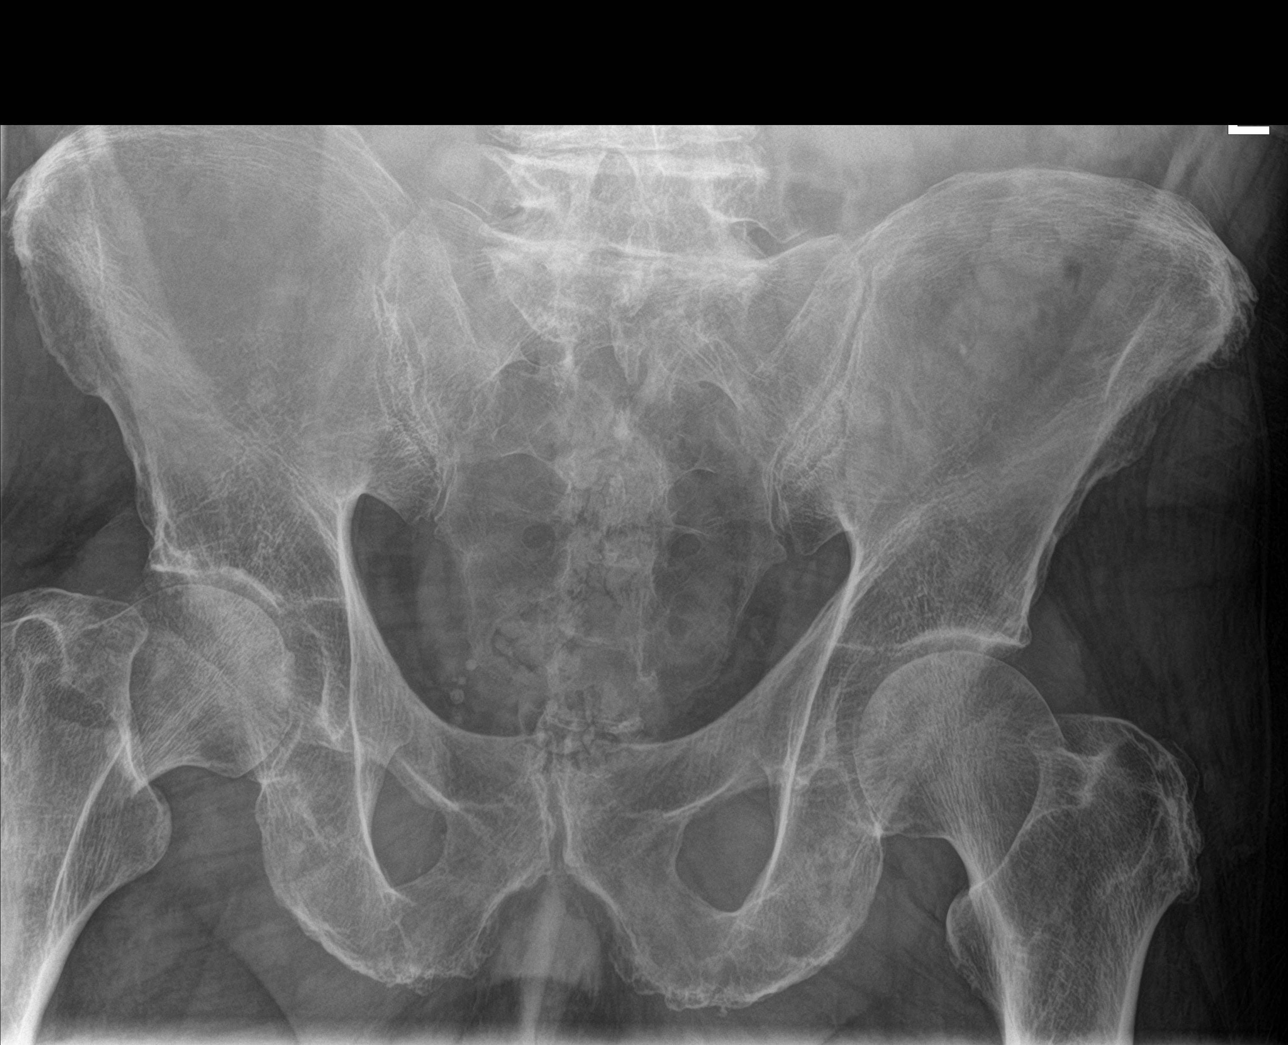

[4 of 4 positions shown; findings below may reference images not displayed]

FINDINGS: Moderately displaced fracture is seen involving the proximal right
femoral neck. No other bony abnormality is noted.
IMPRESSION: Moderately displaced proximal right femoral neck fracture.

## 2021-07-03 ENCOUNTER — Telehealth: Payer: Self-pay | Admitting: Family Medicine

## 2021-07-03 NOTE — Telephone Encounter (Signed)
Please advise 

## 2021-07-03 NOTE — Telephone Encounter (Signed)
Advised pt son as below.

## 2021-07-03 NOTE — Telephone Encounter (Signed)
Pts son called and stated that the care giver thinks the pt has a UTI and he wanted to know if an antibiotic can be called in/ he also stated he has a sample cup if the office would be ok with him dropping of a urine sample / please advise

## 2021-07-05 ENCOUNTER — Other Ambulatory Visit: Payer: Self-pay

## 2021-07-05 DIAGNOSIS — R350 Frequency of micturition: Secondary | ICD-10-CM | POA: Diagnosis not present

## 2021-07-05 NOTE — Progress Notes (Signed)
Order for urine cx and urinalysis placed per Dr Darnell Level.

## 2021-07-06 LAB — URINALYSIS, ROUTINE W REFLEX MICROSCOPIC
Bilirubin, UA: NEGATIVE
Glucose, UA: NEGATIVE
Ketones, UA: NEGATIVE
Nitrite, UA: NEGATIVE
Specific Gravity, UA: 1.014 (ref 1.005–1.030)
Urobilinogen, Ur: 0.2 mg/dL (ref 0.2–1.0)
pH, UA: 6 (ref 5.0–7.5)

## 2021-07-06 LAB — MICROSCOPIC EXAMINATION
Bacteria, UA: NONE SEEN
Casts: NONE SEEN /lpf
Epithelial Cells (non renal): NONE SEEN /hpf (ref 0–10)
WBC, UA: >30 /hpf — AB (ref 0–5)

## 2021-07-08 ENCOUNTER — Other Ambulatory Visit: Payer: Self-pay

## 2021-07-08 DIAGNOSIS — R350 Frequency of micturition: Secondary | ICD-10-CM

## 2021-07-08 MED ORDER — SULFAMETHOXAZOLE-TRIMETHOPRIM 400-80 MG PO TABS
1.0000 | ORAL_TABLET | Freq: Two times a day (BID) | ORAL | 0 refills | Status: AC
Start: 2021-07-08 — End: 2021-07-15

## 2021-07-08 NOTE — Progress Notes (Signed)
Ordered per Dr. Rosanna Randy.

## 2021-07-10 LAB — URINE CULTURE

## 2021-07-18 ENCOUNTER — Other Ambulatory Visit: Payer: Self-pay | Admitting: Family Medicine

## 2021-07-18 NOTE — Telephone Encounter (Signed)
Requested medication (s) are due for refill today:   Yes  Requested medication (s) are on the active medication list:   yes  Future visit scheduled:   Yes on 07/25/2021 with Dr. Rosanna Randy   Last ordered: 05/24/2021 #30, 2 refills  Returned because courtesy refills have been given.   Provider to review for further refills until appt on 8/11.   Requested Prescriptions  Pending Prescriptions Disp Refills   losartan (COZAAR) 50 MG tablet [Pharmacy Med Name: LOSARTAN POTASSIUM 50 MG TAB] 30 tablet 2    Sig: TAKE 1 TABLET BY MOUTH DAILY      Cardiovascular:  Angiotensin Receptor Blockers Failed - 07/18/2021 10:18 AM      Failed - Cr in normal range and within 180 days    Creat  Date Value Ref Range Status  10/07/2017 1.39 (H) 0.70 - 1.18 mg/dL Final    Comment:    For patients >92 years of age, the reference limit for Creatinine is approximately 13% higher for people identified as African-American. .    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[facility])   [facility] Jerrol Banana., MD   2 years ago TIA (transient ischemic attack)   [facility] Jerrol Banana., MD   2 years ago Viral URI with cough   [place], Vickki Muff, PA-C   2 years ago 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

## 2021-07-25 ENCOUNTER — Encounter: Payer: Self-pay | Admitting: Family Medicine

## 2021-07-25 ENCOUNTER — Ambulatory Visit (INDEPENDENT_AMBULATORY_CARE_PROVIDER_SITE_OTHER): Payer: PPO | Admitting: Family Medicine

## 2021-07-25 ENCOUNTER — Other Ambulatory Visit: Payer: Self-pay

## 2021-07-25 VITALS — BP 154/91 | HR 69 | Temp 97.9°F | Resp 18 | Wt 280.2 lb

## 2021-07-25 DIAGNOSIS — H6123 Impacted cerumen, bilateral: Secondary | ICD-10-CM | POA: Diagnosis not present

## 2021-07-25 DIAGNOSIS — G3109 Other frontotemporal dementia: Secondary | ICD-10-CM

## 2021-07-25 DIAGNOSIS — L989 Disorder of the skin and subcutaneous tissue, unspecified: Secondary | ICD-10-CM | POA: Diagnosis not present

## 2021-07-25 DIAGNOSIS — F0280 Dementia in other diseases classified elsewhere without behavioral disturbance: Secondary | ICD-10-CM

## 2021-07-25 DIAGNOSIS — F3289 Other specified depressive episodes: Secondary | ICD-10-CM

## 2021-07-25 DIAGNOSIS — H9193 Unspecified hearing loss, bilateral: Secondary | ICD-10-CM

## 2021-07-25 DIAGNOSIS — I10 Essential (primary) hypertension: Secondary | ICD-10-CM

## 2021-07-25 NOTE — Progress Notes (Deleted)
I,[name],acting as a scribe for Vincent Durie, MD.,have documented all relevant documentation on the behalf of Vincent Durie, MD,as directed by  Vincent Durie, MD while in the presence of Vincent Durie, MD.     Established patient visit   Patient: Vincent Ayala   DOB: Jul 15, 1946   75 y.o. Male  MRN: [id number] Visit Date: 07/25/2021  Today's healthcare provider: Wilhemena Durie, MD   Chief Complaint  Patient presents with   Hypertension   Subjective    HPI  Hypertension, follow-up  BP Readings from Last 3 Encounters:  07/25/21 (!) 154/91  05/23/21 122/74  03/28/21 122/64   Wt Readings from Last 3 Encounters:  07/25/21 280 lb 3.2 oz (127.1 kg)  03/28/21 271 lb (122.9 kg)  01/23/21 273 lb 12.8 oz (124.2 kg)     He was last seen for hypertension 1  year  ago.  BP at that visit was 120/60. Management since that visit includes continuing the same medication.  He reports good compliance with treatment. He is not having side effects.  He is following a Regular diet. He is not exercising. He does not smoke.  Use of agents associated with hypertension: none.   Outside blood pressures are not checked. Symptoms: No chest pain No chest pressure  No palpitations No syncope  No dyspnea No orthopnea  No paroxysmal nocturnal dyspnea No lower extremity edema   Pertinent labs: Lab Results  Component Value Date   CHOL 134 07/12/2018   HDL 45 07/12/2018   LDLCALC 59 07/12/2018   TRIG 150 (H) 07/12/2018   CHOLHDL 3.0 07/12/2018   Lab Results  Component Value Date   NA 141 07/25/2021   K 3.9 07/25/2021   CREATININE 1.37 (H) 07/25/2021   GFRNONAA 56 (L) 07/12/2020   GFRAA 65 07/12/2020   GLUCOSE 127 (H) 07/25/2021     The ASCVD Risk score Mikey Bussing DC Jr., et al., 2013) failed to calculate for the following reasons:   Cannot find a previous HDL lab   Cannot find a previous total cholesterol lab    ---------------------------------------------------------------------------------------------------   Follow up for Dementia:  The patient was last seen for this 1  year  ago. Changes made at last visit include none.  He reports good compliance with treatment. He feels that condition is Unchanged. He is not having side effects.   -----------------------------------------------------------------------------------------   {Show patient history (optional):23778}   Medications: Outpatient Medications Prior to Visit  Medication Sig   Calcium Carbonate-Vitamin D 600-200 MG-UNIT TABS Take 1 tablet by mouth daily.    calcium-vitamin D 250-100 MG-UNIT tablet Take 1 tablet by mouth 2 (two) times daily.   docusate sodium (COLACE) 100 MG capsule Take 1 capsule (100 mg total) by mouth 2 (two) times daily.   gabapentin (NEURONTIN) 300 MG capsule Take 1 capsule (300 mg total) by mouth 3 (three) times daily.   lamoTRIgine (LAMICTAL) 100 MG tablet Take 1 tablet (100 mg total) by mouth 2 (two) times daily.   lamoTRIgine (LAMICTAL) 150 MG tablet Take 150 mg by mouth 2 (two) times daily.   losartan (COZAAR) 50 MG tablet TAKE 1 TABLET BY MOUTH DAILY   Melatonin 3 MG TABS Take 6 mg by mouth at bedtime.    memantine (NAMENDA) 10 MG tablet Take 10 mg by mouth daily.    mirtazapine (REMERON) 15 MG tablet Take 1 tablet (15 mg total) by mouth at bedtime.   Multiple Vitamin (MULTIVITAMIN) tablet  Take 1 tablet by mouth daily.   QUEtiapine (SEROQUEL) 50 MG tablet Take 50 mg by mouth 2 (two) times a day.   sertraline (ZOLOFT) 100 MG tablet TAKE ONE AND ONE-HALF TABLETS DAILY   traZODone (DESYREL) 50 MG tablet Take 50 mg by mouth at bedtime.    bisacodyl (DULCOLAX) 5 MG EC tablet Take 2 tablets (10 mg total) by mouth daily as needed for moderate constipation. (Patient not taking: Reported on 07/25/2021)   [DISCONTINUED] cephALEXin (KEFLEX) 500 MG capsule Take 1 capsule (500 mg total) by mouth 2 (two) times  daily. (Patient not taking: Reported on 07/25/2021)   [DISCONTINUED] ciprofloxacin (CIPRO) 500 MG tablet Take 1 tablet (500 mg total) by mouth 2 (two) times daily. (Patient not taking: Reported on 07/25/2021)   [DISCONTINUED] enoxaparin (LOVENOX) 40 MG/0.4ML injection Inject 0.4 mLs (40 mg total) into the skin daily for 28 days.   [DISCONTINUED] nitrofurantoin, macrocrystal-monohydrate, (MACROBID) 100 MG capsule Take 1 capsule (100 mg total) by mouth 2 (two) times daily. (Patient not taking: Reported on 07/25/2021)   No facility-administered medications prior to visit.    Review of Systems  Constitutional:  Negative for appetite change, chills and fever.  Respiratory:  Negative for chest tightness, shortness of breath and wheezing.   Cardiovascular:  Negative for chest pain and palpitations.  Gastrointestinal:  Negative for abdominal pain, nausea and vomiting.   {Labs  Heme  Chem  Endocrine  Serology  Results Review (optional):23779}   Objective    BP (!) 154/91 (BP Location: Left Arm, Patient Position: Sitting, Cuff Size: Large)   Pulse 69   Temp 97.9 F (36.6 C) (Temporal)   Resp 18   Wt 280 lb 3.2 oz (127.1 kg)   BMI 35.02 kg/m  {Show previous vital signs (optional):23777}   Physical Exam  ***  Results for orders placed or performed in visit on 07/25/21  TSH  Result Value Ref Range   TSH 0.722 0.450 - 4.500 uIU/mL  Comprehensive Metabolic Panel (CMET)  Result Value Ref Range   Glucose 127 (H) 65 - 99 mg/dL   BUN 15 8 - 27 mg/dL   Creatinine, Ser 1.37 (H) 0.76 - 1.27 mg/dL   eGFR 53 (L) >59 mL/min/1.73   BUN/Creatinine Ratio 11 10 - 24   Sodium 141 134 - 144 mmol/L   Potassium 3.9 3.5 - 5.2 mmol/L   Chloride 104 96 - 106 mmol/L   CO2 21 20 - 29 mmol/L   Calcium 9.3 8.6 - 10.2 mg/dL   Total Protein 6.5 6.0 - 8.5 g/dL   Albumin 4.4 3.7 - 4.7 g/dL   Globulin, Total 2.1 1.5 - 4.5 g/dL   Albumin/Globulin Ratio 2.1 1.2 - 2.2   Bilirubin Total 0.3 0.0 - 1.2 mg/dL    Alkaline Phosphatase 90 44 - 121 IU/L   AST 11 0 - 40 IU/L   ALT 12 0 - 44 IU/L  CBC with Differential/Platelet  Result Value Ref Range   WBC 6.2 3.4 - 10.8 x10E3/uL   RBC 4.85 4.14 - 5.80 x10E6/uL   Hemoglobin 15.1 13.0 - 17.7 g/dL   Hematocrit 44.7 37.5 - 51.0 %   MCV 92 79 - 97 fL   MCH 31.1 26.6 - 33.0 pg   MCHC 33.8 31.5 - 35.7 g/dL   RDW 13.2 11.6 - 15.4 %   Platelets 292 150 - 450 x10E3/uL   Neutrophils 57 Not Estab. %   Lymphs 27 Not Estab. %   Monocytes 9 Not Estab. %  Eos 6 Not Estab. %   Basos 1 Not Estab. %   Neutrophils Absolute 3.6 1.4 - 7.0 x10E3/uL   Lymphocytes Absolute 1.6 0.7 - 3.1 x10E3/uL   Monocytes Absolute 0.5 0.1 - 0.9 x10E3/uL   EOS (ABSOLUTE) 0.3 0.0 - 0.4 x10E3/uL   Basophils Absolute 0.1 0.0 - 0.2 x10E3/uL   Immature Granulocytes 0 Not Estab. 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 Essential hypertension - TSH - Comprehensive Metabolic Panel (CMET) - CBC with Differential/Platelet  3. Facial lesion Will refer to Dermatology. - Ambulatory referral to Dermatology  4. Other depression   5. Bilateral impacted cerumen Ear irrigation performed.    6. Decreased hearing of both ears Ear irrigation performed.     No follow-ups on file.      {provider attestation***:1}   Vincent Durie, MD  [facility] [phone] (phone) [phone] (fax)  [place]

## 2021-07-25 NOTE — Progress Notes (Signed)
Established patient visit   Patient: Vincent Ayala. [name]   DOB: 09/28/1946   75 y.o. Male  MRN: [id number] Visit Date: 07/25/2021  Today's healthcare provider: Wilhemena Durie, MD   Chief Complaint  Patient presents with   Hypertension   Subjective    HPI  Patient comes in today for follow-up of dementia.  Patient's wife also has dementia and they live at home with caregivers that come and go in the day and night.  He used to have behavioral disturbance with his frontotemporal dementia but no longer does so.  His appetite is good.  Does have decreased hearing.  He does have some constipation with hard stools. Also has a facial lesion that is slow to heal. Hypertension, follow-up  BP Readings from Last 3 Encounters:  07/25/21 (!) 154/91  05/23/21 122/74  03/28/21 122/64   Wt Readings from Last 3 Encounters:  07/25/21 280 lb 3.2 oz (127.1 kg)  03/28/21 271 lb (122.9 kg)  01/23/21 273 lb 12.8 oz (124.2 kg)     He was last seen for hypertension 07/11/2020.  BP at that visit was 120/60. Management since that visit includes; on losartan. He reports good compliance with treatment. He is not having side effects.  He is not exercising. He is not adherent to low salt diet.   Outside blood pressures are .  He does not smoke.  Use of agents associated with hypertension: none.   ---------------------------------------------------------------------------------------------------   Medications: Outpatient Medications Prior to Visit  Medication Sig   Calcium Carbonate-Vitamin D 600-200 MG-UNIT TABS Take 1 tablet by mouth daily.    calcium-vitamin D 250-100 MG-UNIT tablet Take 1 tablet by mouth 2 (two) times daily.   docusate sodium (COLACE) 100 MG capsule Take 1 capsule (100 mg total) by mouth 2 (two) times daily.   gabapentin (NEURONTIN) 300 MG capsule Take 1 capsule (300 mg total) by mouth 3 (three) times daily.   lamoTRIgine (LAMICTAL) 100 MG tablet Take 1 tablet (100 mg  total) by mouth 2 (two) times daily.   lamoTRIgine (LAMICTAL) 150 MG tablet Take 150 mg by mouth 2 (two) times daily.   losartan (COZAAR) 50 MG tablet TAKE 1 TABLET BY MOUTH DAILY   Melatonin 3 MG TABS Take 6 mg by mouth at bedtime.    memantine (NAMENDA) 10 MG tablet Take 10 mg by mouth daily.    mirtazapine (REMERON) 15 MG tablet Take 1 tablet (15 mg total) by mouth at bedtime.   Multiple Vitamin (MULTIVITAMIN) tablet Take 1 tablet by mouth daily.   QUEtiapine (SEROQUEL) 50 MG tablet Take 50 mg by mouth 2 (two) times a day.   sertraline (ZOLOFT) 100 MG tablet TAKE ONE AND ONE-HALF TABLETS DAILY   traZODone (DESYREL) 50 MG tablet Take 50 mg by mouth at bedtime.    bisacodyl (DULCOLAX) 5 MG EC tablet Take 2 tablets (10 mg total) by mouth daily as needed for moderate constipation. (Patient not taking: Reported on 07/25/2021)   [DISCONTINUED] cephALEXin (KEFLEX) 500 MG capsule Take 1 capsule (500 mg total) by mouth 2 (two) times daily. (Patient not taking: Reported on 07/25/2021)   [DISCONTINUED] ciprofloxacin (CIPRO) 500 MG tablet Take 1 tablet (500 mg total) by mouth 2 (two) times daily. (Patient not taking: Reported on 07/25/2021)   [DISCONTINUED] enoxaparin (LOVENOX) 40 MG/0.4ML injection Inject 0.4 mLs (40 mg total) into the skin daily for 28 days.   [DISCONTINUED] nitrofurantoin, macrocrystal-monohydrate, (MACROBID) 100 MG capsule Take 1 capsule (100 mg total)  by mouth 2 (two) times daily. (Patient not taking: Reported on 07/25/2021)   No facility-administered medications prior to visit.    Review of Systems      Objective    BP (!) 154/91 (BP Location: Left Arm, Patient Position: Sitting, Cuff Size: Large)   Pulse 69   Temp 97.9 F (36.6 C) (Temporal)   Resp 18   Wt 280 lb 3.2 oz (127.1 kg)   BMI 35.02 kg/m  BP Readings from Last 3 Encounters:  07/25/21 (!) 154/91  05/23/21 122/74  03/28/21 122/64   Wt Readings from Last 3 Encounters:  07/25/21 280 lb 3.2 oz (127.1 kg)   03/28/21 271 lb (122.9 kg)  01/23/21 273 lb 12.8 oz (124.2 kg)    Physical Exam Vitals reviewed.  Constitutional:      Appearance: He is well-developed.  HENT:     Head: Normocephalic and atraumatic.     Right Ear: External ear normal.     Left Ear: External ear normal.     Ears:     Comments: Both ears blocked with cerumen.    Nose: Nose normal.  Eyes:     General:        Left eye: No discharge.     Conjunctiva/sclera: Conjunctivae normal.  Neck:     Thyroid: No thyromegaly.  Cardiovascular:     Rate and Rhythm: Normal rate and regular rhythm.     Heart sounds: Normal heart sounds.  Pulmonary:     Effort: Pulmonary effort is normal.     Breath sounds: Normal breath sounds.  Abdominal:     Palpations: Abdomen is soft.     Tenderness: There is no abdominal tenderness.  Musculoskeletal:     Comments: Ambulating well today.    Skin:    General: Skin is warm and dry.     Comments: Poorly healing facial lesion.  Neurological:     General: No focal deficit present.     Mental Status: He is alert.  Psychiatric:        Mood and Affect: Mood normal.        Behavior: Behavior normal.      Results for orders placed or performed in visit on 07/25/21  TSH  Result Value Ref Range   TSH 0.722 0.450 - 4.500 uIU/mL  Comprehensive Metabolic Panel (CMET)  Result Value Ref Range   Glucose 127 (H) 65 - 99 mg/dL   BUN 15 8 - 27 mg/dL   Creatinine, Ser 1.37 (H) 0.76 - 1.27 mg/dL   eGFR 53 (L) >59 mL/min/1.73   BUN/Creatinine Ratio 11 10 - 24   Sodium 141 134 - 144 mmol/L   Potassium 3.9 3.5 - 5.2 mmol/L   Chloride 104 96 - 106 mmol/L   CO2 21 20 - 29 mmol/L   Calcium 9.3 8.6 - 10.2 mg/dL   Total Protein 6.5 6.0 - 8.5 g/dL   Albumin 4.4 3.7 - 4.7 g/dL   Globulin, Total 2.1 1.5 - 4.5 g/dL   Albumin/Globulin Ratio 2.1 1.2 - 2.2   Bilirubin Total 0.3 0.0 - 1.2 mg/dL   Alkaline Phosphatase 90 44 - 121 IU/L   AST 11 0 - 40 IU/L   ALT 12 0 - 44 IU/L  CBC with  Differential/Platelet  Result Value Ref Range   WBC 6.2 3.4 - 10.8 x10E3/uL   RBC 4.85 4.14 - 5.80 x10E6/uL   Hemoglobin 15.1 13.0 - 17.7 g/dL   Hematocrit 44.7 37.5 - 51.0 %  MCV 92 79 - 97 fL   MCH 31.1 26.6 - 33.0 pg   MCHC 33.8 31.5 - 35.7 g/dL   RDW 13.2 11.6 - 15.4 %   Platelets 292 150 - 450 x10E3/uL   Neutrophils 57 Not Estab. %   Lymphs 27 Not Estab. %   Monocytes 9 Not Estab. %   Eos 6 Not Estab. %   Basos 1 Not Estab. %   Neutrophils Absolute 3.6 1.4 - 7.0 x10E3/uL   Lymphocytes Absolute 1.6 0.7 - 3.1 x10E3/uL   Monocytes Absolute 0.5 0.1 - 0.9 x10E3/uL   EOS (ABSOLUTE) 0.3 0.0 - 0.4 x10E3/uL   Basophils Absolute 0.1 0.0 - 0.2 x10E3/uL   Immature Granulocytes 0 Not Estab. 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  Followed by neurology.  On Namenda Seroquel labial issues.  Also on Lamictal  2. Essential hypertension Controlled on losartan - TSH - Comprehensive Metabolic Panel (CMET) - CBC with Differential/Platelet  3. Facial lesion to dermatology - Ambulatory referral to Dermatology  4. Other depression On sertraline and Remeron.  Continue this for the time being  5. Bilateral impacted cerumen Ears irrigated  6. Decreased hearing of both ears    No follow-ups on file.      I, [place], have reviewed all documentation for this visit. The documentation on 08/05/21 for the exam, diagnosis, procedures, and orders are all accurate and complete.    Jenetta Wease Cranford Mon, MD  [facility] [phone] (phone) [phone] (fax)  [place]

## 2021-07-25 NOTE — Patient Instructions (Addendum)
Add stool softener daily

## 2021-07-26 LAB — CBC WITH DIFFERENTIAL/PLATELET
Basophils Absolute: 0.1 10*3/uL (ref 0.0–0.2)
Basos: 1 %
EOS (ABSOLUTE): 0.3 10*3/uL (ref 0.0–0.4)
Eos: 6 %
Hematocrit: 44.7 % (ref 37.5–51.0)
Hemoglobin: 15.1 g/dL (ref 13.0–17.7)
Immature Grans (Abs): 0 10*3/uL (ref 0.0–0.1)
Immature Granulocytes: 0 %
Lymphocytes Absolute: 1.6 10*3/uL (ref 0.7–3.1)
Lymphs: 27 %
MCH: 31.1 pg (ref 26.6–33.0)
MCHC: 33.8 g/dL (ref 31.5–35.7)
MCV: 92 fL (ref 79–97)
Monocytes Absolute: 0.5 10*3/uL (ref 0.1–0.9)
Monocytes: 9 %
Neutrophils Absolute: 3.6 10*3/uL (ref 1.4–7.0)
Neutrophils: 57 %
Platelets: 292 10*3/uL (ref 150–450)
RBC: 4.85 x10E6/uL (ref 4.14–5.80)
RDW: 13.2 % (ref 11.6–15.4)
WBC: 6.2 10*3/uL (ref 3.4–10.8)

## 2021-07-26 LAB — COMPREHENSIVE METABOLIC PANEL
ALT: 12 IU/L (ref 0–44)
AST: 11 IU/L (ref 0–40)
Albumin/Globulin Ratio: 2.1 (ref 1.2–2.2)
Albumin: 4.4 g/dL (ref 3.7–4.7)
Alkaline Phosphatase: 90 IU/L (ref 44–121)
BUN/Creatinine Ratio: 11 (ref 10–24)
BUN: 15 mg/dL (ref 8–27)
Bilirubin Total: 0.3 mg/dL (ref 0.0–1.2)
CO2: 21 mmol/L (ref 20–29)
Calcium: 9.3 mg/dL (ref 8.6–10.2)
Chloride: 104 mmol/L (ref 96–106)
Creatinine, Ser: 1.37 mg/dL — ABNORMAL HIGH (ref 0.76–1.27)
Globulin, Total: 2.1 g/dL (ref 1.5–4.5)
Glucose: 127 mg/dL — ABNORMAL HIGH (ref 65–99)
Potassium: 3.9 mmol/L (ref 3.5–5.2)
Sodium: 141 mmol/L (ref 134–144)
Total Protein: 6.5 g/dL (ref 6.0–8.5)
eGFR: 53 mL/min/{1.73_m2} — ABNORMAL LOW (ref >59)

## 2021-07-26 LAB — TSH: TSH: 0.722 u[IU]/mL (ref 0.450–4.500)

## 2021-08-05 ENCOUNTER — Other Ambulatory Visit: Payer: PPO

## 2021-08-05 ENCOUNTER — Other Ambulatory Visit: Payer: Self-pay

## 2021-08-05 DIAGNOSIS — Z515 Encounter for palliative care: Secondary | ICD-10-CM

## 2021-08-05 NOTE — Progress Notes (Signed)
PATIENT NAME: Vincent Ayala. Humphreys DOB: [birth year] MRN: [id number]  PRIMARY CARE PROVIDER: Jerrol Banana., MD  RESPONSIBLE PARTY:  Acct ID - Guarantor Home Phone Work Phone Relationship Acct Type  [id number] KARAC, [phone]  Self P/F     [address], [place], [place] [address]   TELEHEALTH VISIT STATEMENT Due to the COVID-19 crisis, this visit was done via telemedicine from my office and it was initiated and consent by this patient and or family.  PLAN OF CARE and INTERVENTIONS:               1.  GOALS OF CARE/ ADVANCE CARE PLANNING:  Remain home with the assistance of wife and private caregivers.               2.  PATIENT/CAREGIVER EDUCATION:  Safety               4. PERSONAL EMERGENCY PLAN:  Activate 911 for emergencies.               5.  DISEASE STATUS:  Phone call made to wife to follow up on patient's overall condition.  Wife states caregiver [name] is also present.  Caregivers are assisting patient and wife in the am and pm. Wife states patient is doing well overall.  He continues to enjoy eating with 3 meals a day and snacking.  They are going out to eat on occasion but [name]-wife states they are no longer doing this on a daily basis.  Wife feels there may have bee some weight gain but no weight loss since our last visit.  Patient was noted to have s/s of a UTI and was treated with antibiotics.  Patient is no longer reporting any symptoms.  Patient has a rolling walker but does not use this on a regular basis.  Wife states there have been no falls to her knowledge.  Family and caregivers continues to encourage patient to use his walker for safe ambulation.  Patient was recently seen by PCP and referred to dermatology for a lesion on his face.  Wife is uncertain about any upcoming appointments.  Caregiver [name] and wife note there is a small area by patient's mustache and appears to be a rash on his face.  Patient is having regular bowel movements.  Wife is not aware  of patient needing any type of stool softener.    HISTORY OF PRESENT ILLNESS:  75 year old male with Dementia.  Patient is being followed by Palliative Care every 4-8 weeks and PRN.  CODE STATUS: DNR ADVANCED DIRECTIVES: No MOST FORM: No PPS: 50%          Lorenza Burton, RN

## 2021-08-06 IMAGING — CR DG HIP (WITH OR WITHOUT PELVIS) 2-3V*R*
1 series · 4 of 4 positions shown · non-contrast
Comparison: Preoperative radiograph 07/06/2019

CLINICAL DATA: Slip on wet recently mopped floor in the kitchen
leading to fall. Right hip pain. Right hip arthroplasty 6 weeks ago.

EXAM:
DG HIP (WITH OR WITHOUT PELVIS) 2-3V RIGHT

[Series 1: dg hip unilat w or w/o pelvis 2-3 views  · non-contrast · 0.14mm/px · 4 of 4 slices shown]
[im 1/4]
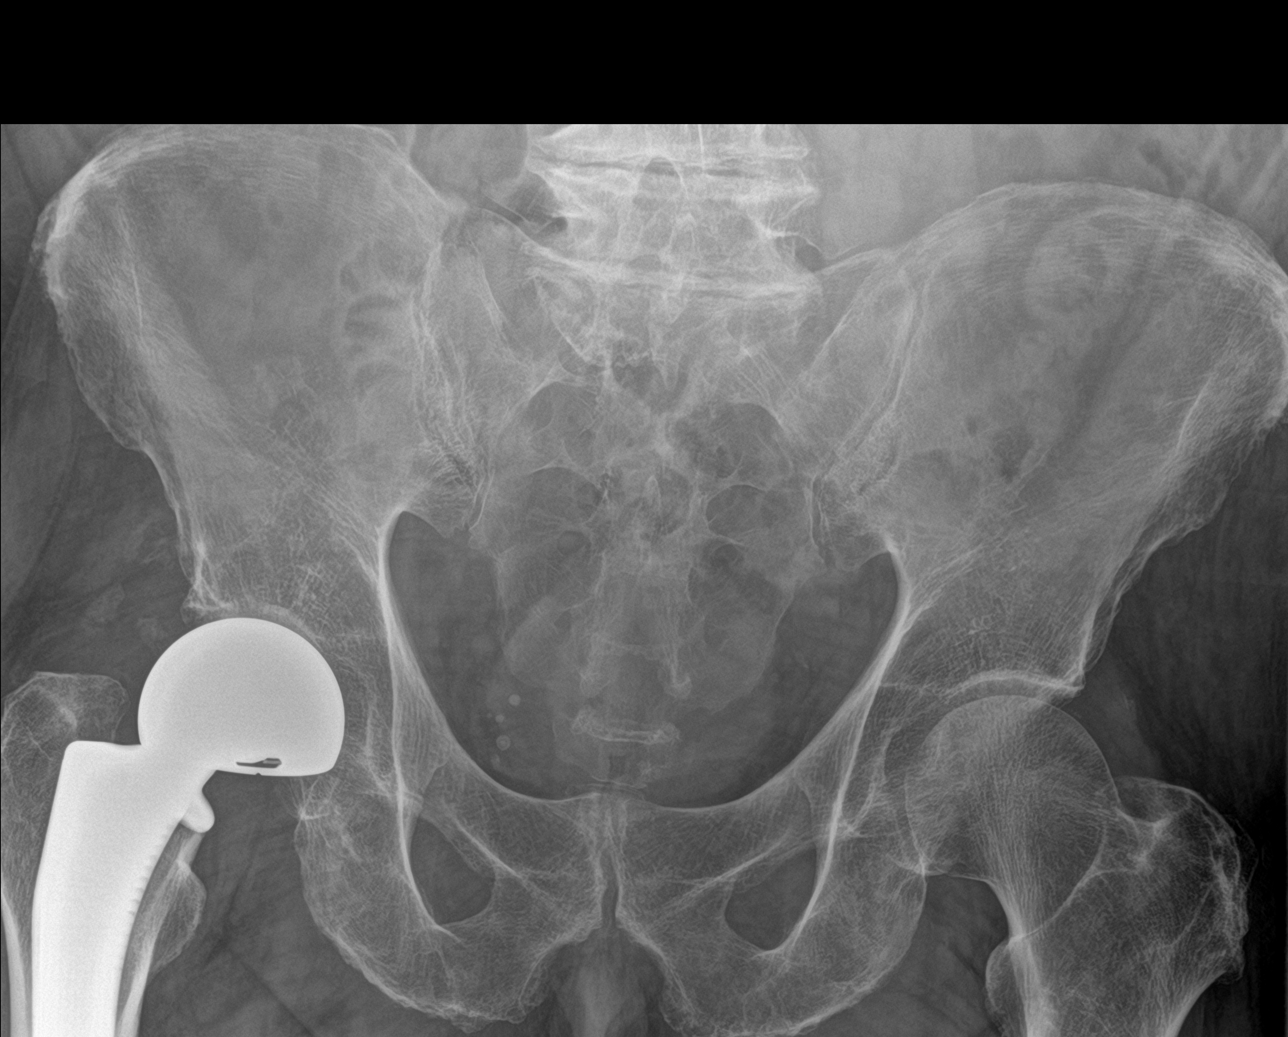
[im 2/4]
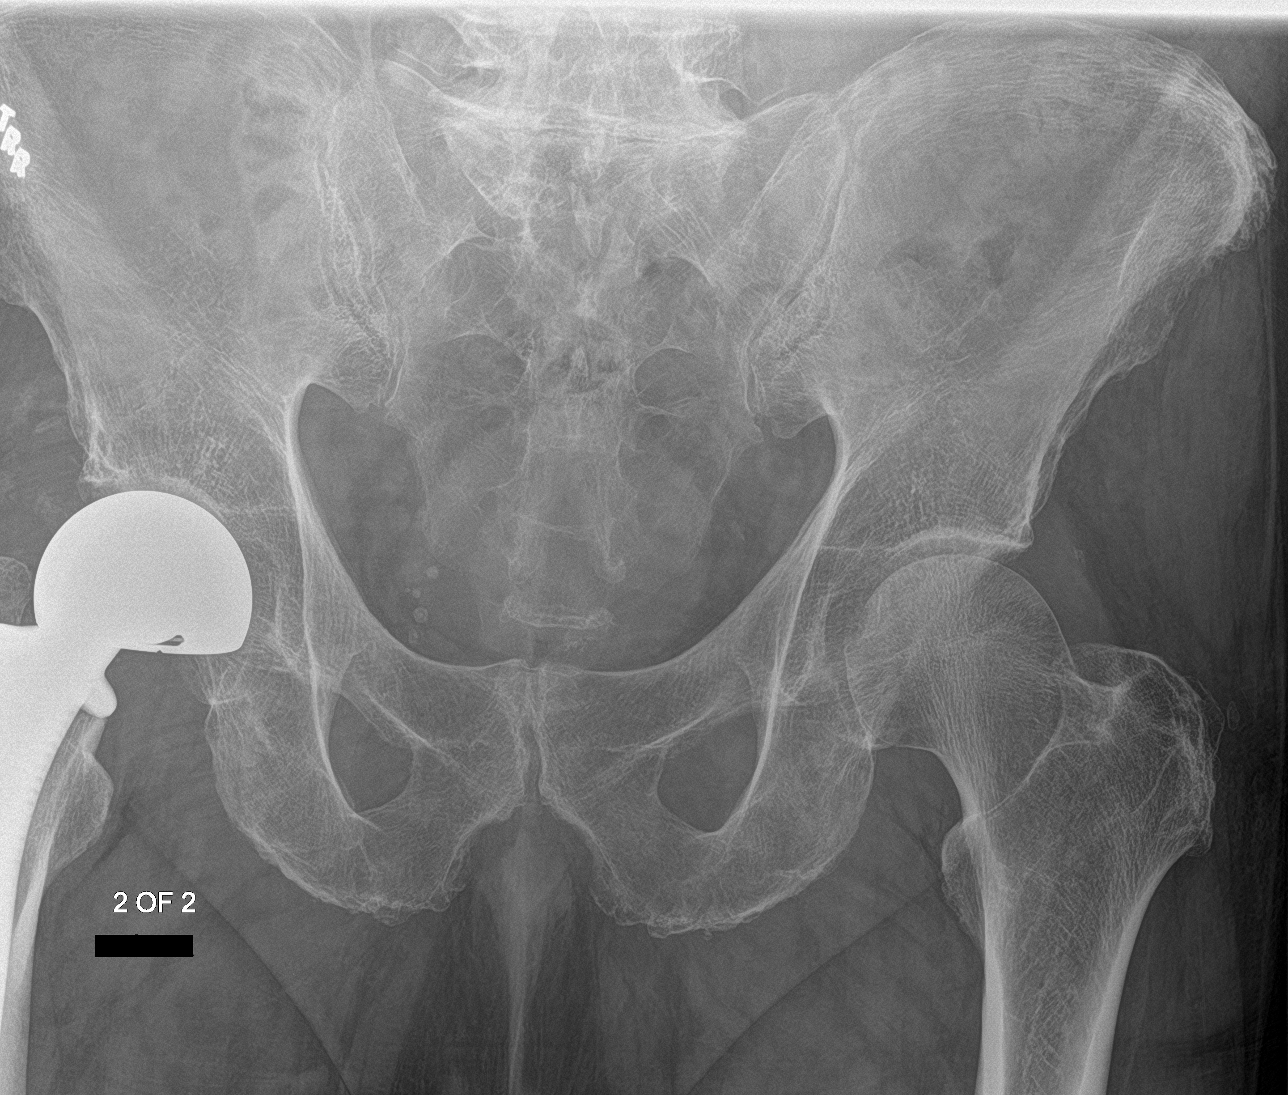
[im 3/4]
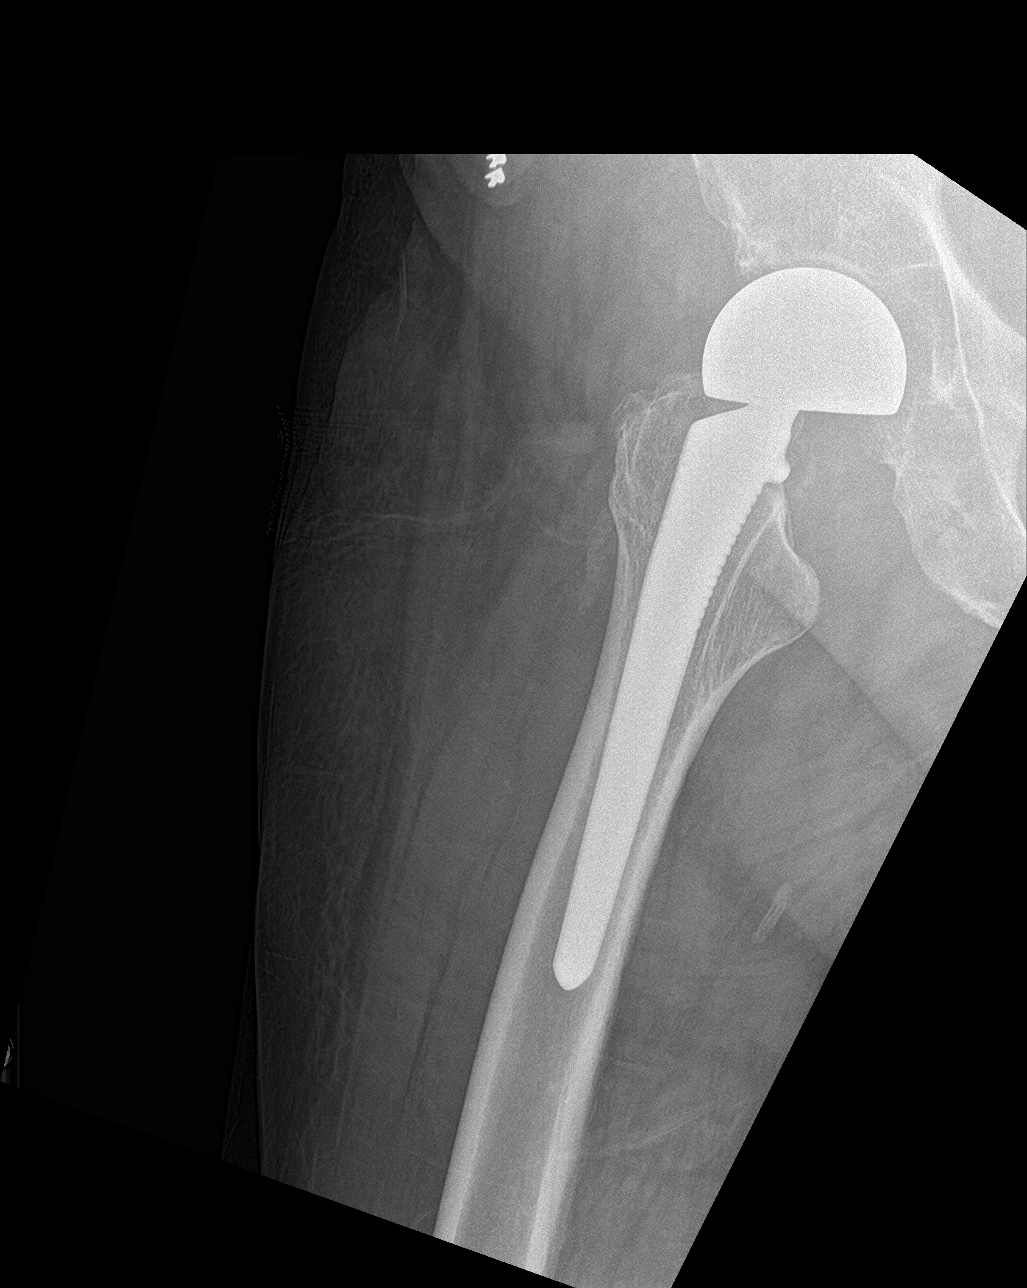
[im 4/4]
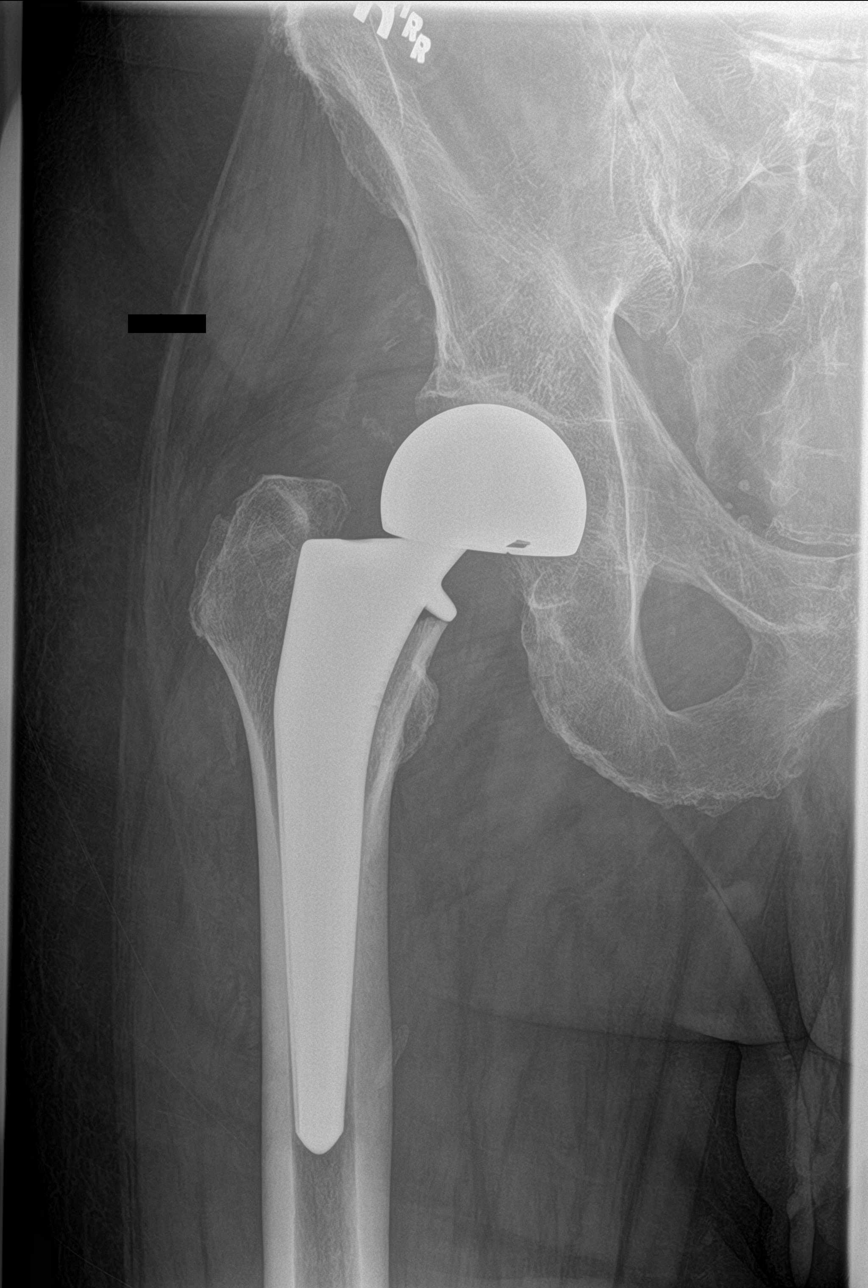

[4 of 4 positions shown; findings below may reference images not displayed]

FINDINGS: Right hip arthroplasty in expected alignment. No periprosthetic
lucency or fracture. Pubic rami are intact without acute fracture.
The pubic symphysis and sacroiliac joints are congruent.
Degenerative change in the included lumbar spine.
IMPRESSION: Right hip arthroplasty in expected alignment without complication.
No acute fracture.

## 2021-08-06 IMAGING — CR DG KNEE COMPLETE 4+V*R*
1 series · 5 of 5 positions shown · non-contrast
Comparison: None.

CLINICAL DATA: Slip on wet recently mopped floor in kitchen prior
to arrival leading to fall. Right knee pain.

EXAM:
RIGHT KNEE - COMPLETE 4+ VIEW

[Series 1: dg knee complete 4 views right · 0.14mm/px · 5 of 5 slices shown]
[im 1/5]
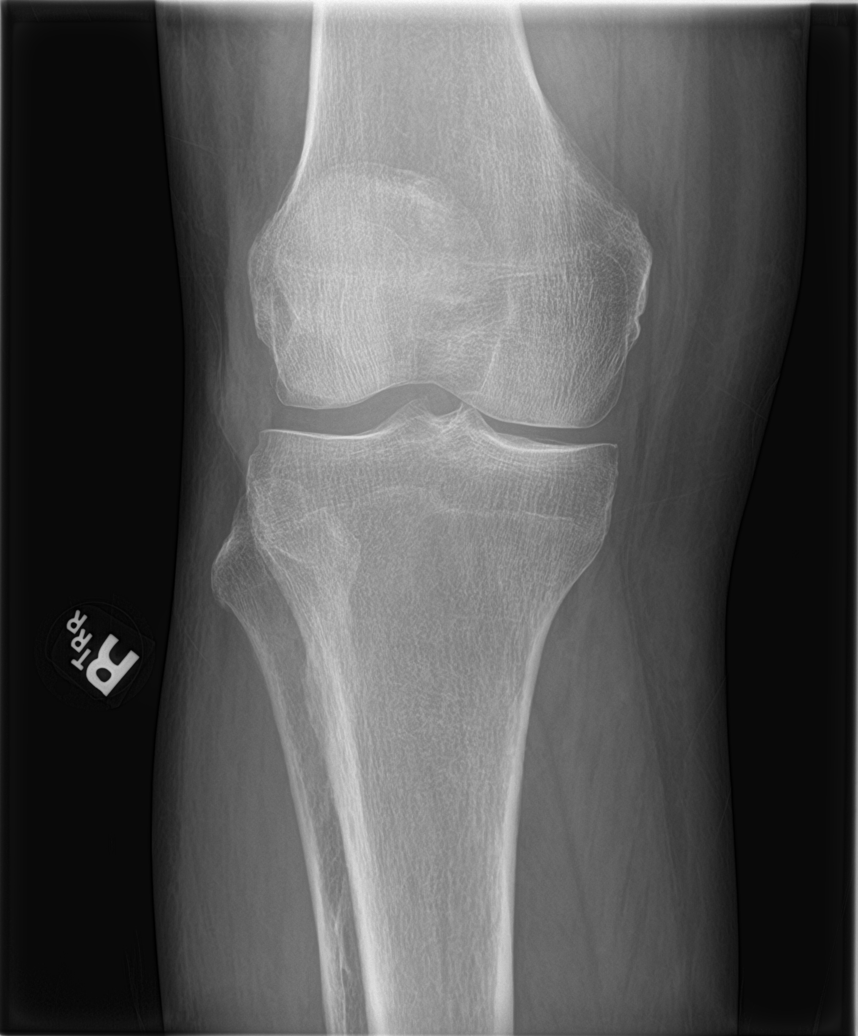
[im 2/5]
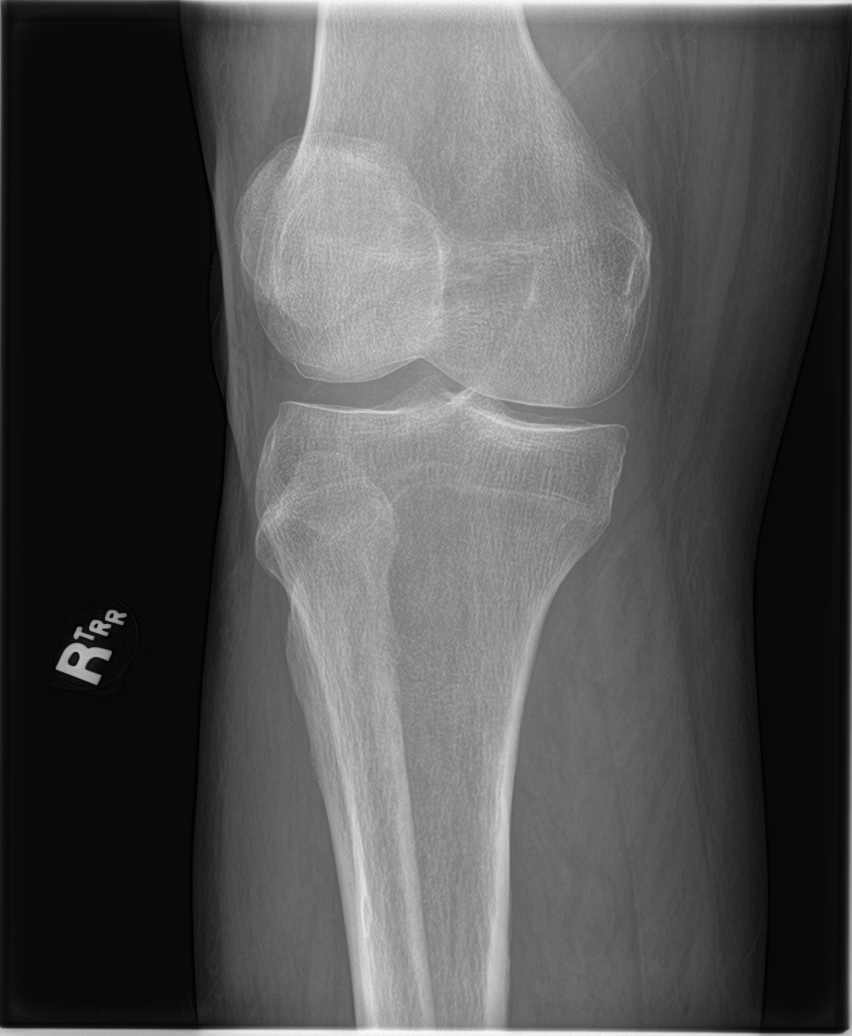
[im 3/5]
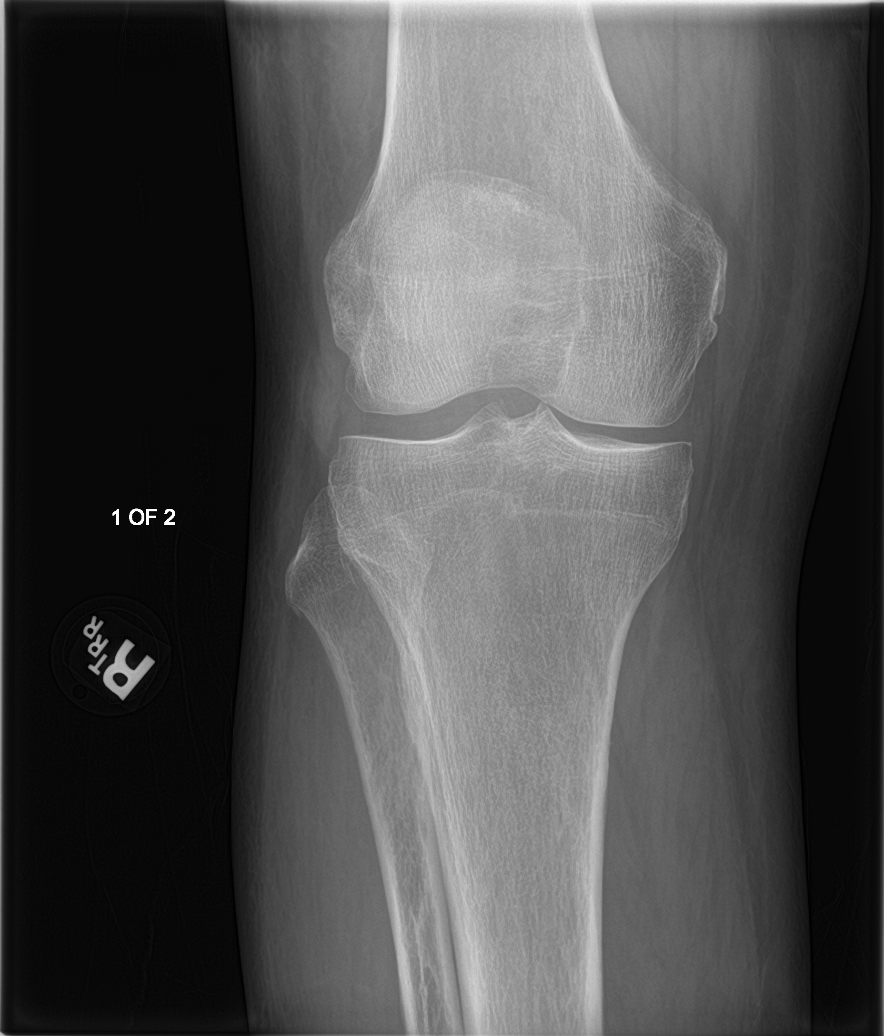
[im 4/5]
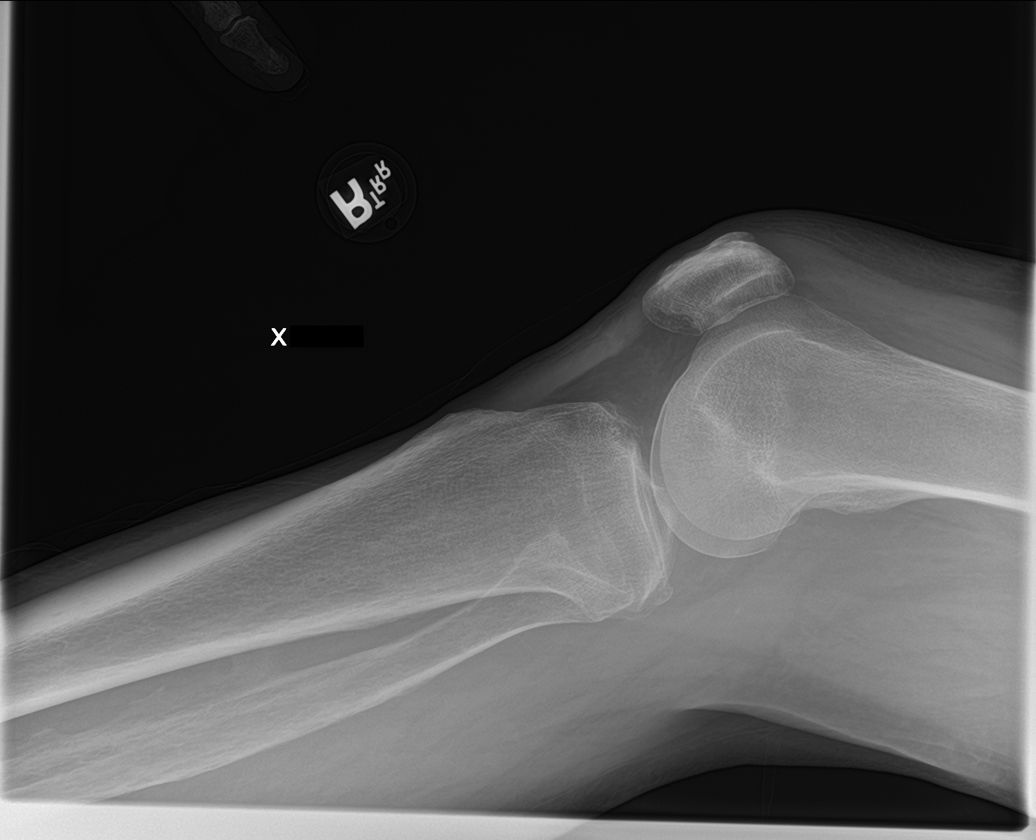
[im 5/5]
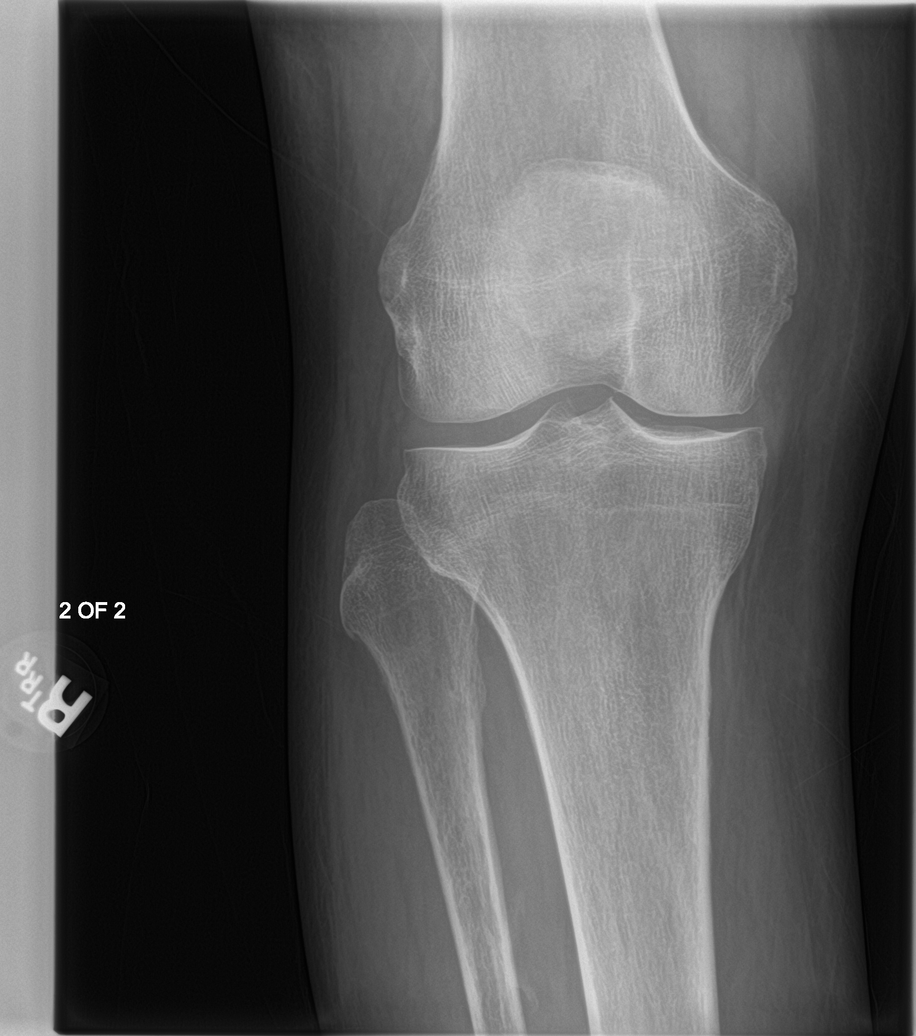

[5 of 5 positions shown; findings below may reference images not displayed]

FINDINGS: No evidence of fracture, dislocation, or joint effusion. Mild medial
compartment joint space narrowing. Chronic appearing ossification of
the interosseous membrane in the proximal lower extremity, only
partially included. Soft tissues are unremarkable.
IMPRESSION: Mild medial osteoarthritis.  No acute osseous abnormality.

## 2021-08-07 ENCOUNTER — Other Ambulatory Visit: Payer: Self-pay | Admitting: Family Medicine

## 2021-08-07 NOTE — Telephone Encounter (Signed)
Requested Prescriptions  Pending Prescriptions Disp Refills  . losartan (COZAAR) 50 MG tablet [Pharmacy Med Name: LOSARTAN POTASSIUM 50 MG TAB] 30 tablet 2    Sig: TAKE 1 TABLET BY MOUTH DAILY     Cardiovascular:  Angiotensin Receptor Blockers Failed - 08/07/2021  4:21 PM      Failed - Cr in normal range and within 180 days    Creat  Date Value Ref Range Status  10/07/2017 1.39 (H) 0.70 - 1.18 mg/dL Final    Comment:    For patients >76 years of age, the reference limit for Creatinine is approximately 13% higher for people identified as African-American. .    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[facility])   [facility] Jerrol Banana., MD   1 year ago Urinary tract infection with hematuria, site unspecified   [facility] Jerrol Banana., MD   1 year ago 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

## 2021-08-09 ENCOUNTER — Other Ambulatory Visit: Payer: Self-pay | Admitting: Family Medicine

## 2021-08-09 NOTE — Telephone Encounter (Signed)
Request by interface surescripts. Already signed. Receipt confirmed by pharmacy on 08/07/21 at 716 pm

## 2021-09-02 ENCOUNTER — Other Ambulatory Visit: Payer: PPO

## 2021-09-02 ENCOUNTER — Other Ambulatory Visit: Payer: Self-pay

## 2021-09-02 DIAGNOSIS — Z515 Encounter for palliative care: Secondary | ICD-10-CM

## 2021-09-02 NOTE — Progress Notes (Signed)
COMMUNITY PALLIATIVE CARE SW NOTE  PATIENT NAME: Vincent Ayala. [place] DOB: [birth year] MRN: [id number]  PRIMARY CARE PROVIDER: Jerrol Ayala., MD  RESPONSIBLE PARTY:  Acct ID - Guarantor Home Phone Work Phone Relationship Acct Type  [id number] Vincent [phone]  Self P/F     [address], [place],  [address]                                                    Telephonic Visit        Palliative care SW outreached patient to complete telephonic visit. Patient's wife, Vincent Ayala, provided update on medical condition and/or changes. Wife shares that patient has been maintaining same level of care since last in home visit, with no changes or declines. Patient continues to have cognitive and memory deficits in due to progressing dementia. Patient has not complained of pain. No falls reported. Patient continues to be ambulatory w/o AD.   Wife shares that patients appetite remains good. No significant weight changes noted.   No medication needs or adjustments noted. No s/s of UTI.  Psychosocial assessment: No financial, food insecurities or issues with transportation noted. No other psychosocial needs. No S/S of depression or anxiety noted, per wife. Patient continues to have private caregivers in the home daily in the AM. No concerns with caregivers. Wife states that she feels patient has been doing well. Wife appreciative of telephonic check in.   Palliative care will continue to monitor and assist with long term care planning as needed. Next in  home PC visit scheduled for 10/13 '@11am'$  with PC RN and SW.      SOCIAL HX:  Social History   Tobacco Use   Smoking status: Never   Smokeless tobacco: Never  Substance Use Topics   Alcohol use: No    CODE STATUS: DNR  ADVANCED DIRECTIVES: Y, son is HCPOA. MOST FORM COMPLETE:  N HOSPICE EDUCATION PROVIDED: N  [id number] continues to be supervision-Vincent Ayala with ADL's. Caregiver and wife prepares meals. Patient A&O to self with poor  insight and judgement due to dementia.      Vincent Ayala, [place]

## 2021-09-05 ENCOUNTER — Other Ambulatory Visit: Payer: Self-pay | Admitting: Family Medicine

## 2021-09-26 ENCOUNTER — Other Ambulatory Visit: Payer: Self-pay

## 2021-09-26 ENCOUNTER — Other Ambulatory Visit: Payer: PPO

## 2021-09-26 VITALS — BP 150/78 | HR 70 | Temp 98.0°F | Resp 18

## 2021-09-26 DIAGNOSIS — Z515 Encounter for palliative care: Secondary | ICD-10-CM

## 2021-09-26 NOTE — Progress Notes (Signed)
COMMUNITY PALLIATIVE CARE SW NOTE  PATIENT NAME: Vincent Ayala. Aldea DOB: [birth year] MRN: [id number]  PRIMARY CARE PROVIDER: Jerrol Banana., MD  RESPONSIBLE PARTY:  Acct ID - Guarantor Home Phone Work Phone Relationship Acct Type  [id number] XANE, [phone]  Self P/F     [address], [place], [place] [address]     PLAN OF CARE and INTERVENTIONS:             GOALS OF CARE/ ADVANCE CARE PLANNING:  Patient is DNR, DNR form is complete. Son has HCPOA. Patient has living will, Patients goal is to remain in the home with wife for as long as possible.    2.         SOCIAL/EMOTIONAL/SPIRITUAL ASSESSMENT/ INTERVENTIONS:  SW and RN met with patient and patient's wife [name]. Patient was sitting at dining room table at start of visit. Caregiver, [name], present during the visit.   Wife updated SW and RN on medical conditions and changes. Patient shared that he eats good, no loss in appetite. No recent falls. Patient reported some pain in his knees, while ambulating. Patient sleeps well. Patient has frequent incontinence episodes.  Patient wife shared that patient sleeps well and naps during the day. Patient's caregivers writes their daily activities in a journal every day. Caregiver and wife share that patients legs have become weaker over the past 2 months. HH PT referral may be beneficial for patient.    RN reviewed medications and vitals. Patients son manages automatic pill box as well as doctor appointments. Patient encouraged to drink more water to assist with preventing UTI's.    Patient continues to have private caregivers daily [name]) 7am-noon and (Maria/Angie) 530pm-10pm. Wife shared that [name] takes her to doctors' appointments if needed as well due to son work schedule sometimes.   Psychosocial assessment completed. Patient continues to have memory deficits and cognitive decline. No needs identified at this time. No S/S of depression or anxiety. Wife presents with  cognitive deficits as well. Patient and wife try to go out ponce a week for lunch.  SW provided supportive listening and education. SW discussed goals, reviewed care plan, provided emotional support, used active and reflective listening. Palliative care will continue to monitor and assist with long term care planning as needed.   3.         PATIENT/CAREGIVER EDUCATION/ COPING: Patient alert with confusion during visit. Patient has apparent memory deficits and asks wife and son for certain details. Wife appears has some apparent  memory deficits as well. Son and DIL live locally and are very supportive.   4.         PERSONAL EMERGENCY PLAN:  Patient to call 9-1-1 for emergencies.   5.         COMMUNITY RESOURCES COORDINATION/ HEALTH CARE NAVIGATION: Son and wife manages patients care. Patient has private caregiver 7 days a week 8am-noon. Ortho to order in home therapy for patient,   6         FINANCIAL/LEGAL CONCERNS/INTERVENTIONS:  None. Son has attempted to apply for LTC or [place] Medicaid for patient but was told they would not qualify     SOCIAL HX:  Social History   Tobacco Use   Smoking status: Never   Smokeless tobacco: Never  Substance Use Topics   Alcohol use: No    CODE STATUS: DNR  ADVANCED DIRECTIVES: Y MOST FORM COMPLETE:  N HOSPICE EDUCATION PROVIDED: N  PPS: Patient is independent with all ADL's  at this time. Caregiver provides meals. Patient is A&O to self only  with poor insight and judgement due to dementia.  Time spent: 45 min    [place], [place]

## 2021-09-26 NOTE — Progress Notes (Signed)
PATIENT NAME: Vincent Ayala. Eguia DOB: [birth year] MRN: [id number]  PRIMARY CARE PROVIDER: Jerrol Banana., MD  RESPONSIBLE PARTY:  Acct ID - Guarantor Home Phone Work Phone Relationship Acct Type  [id number] TILDON, [phone]  Self P/F     [address], [place], [place] [address]    PLAN OF CARE and INTERVENTIONS:               1.  GOALS OF CARE/ ADVANCE CARE PLANNING:  Remain home under the assistance of private caregivers.               2.  PATIENT/CAREGIVER EDUCATION:  Safety               4. PERSONAL EMERGENCY PLAN:  Activate 911 for emergencies.               5.  DISEASE STATUS:  Joint visit completed with [place], SW, wife-[name] and caregiver.  Patient is ambulating to the kitchen without a walker on this visit.  No falls are reported.  Caregiver reports weakness to legs and some discomfort to the knees. Discussed possible PT to help with strengthening of lower extremities.  Caregiver reports patient does not have a walker and the one he previously had is broken.   No changes in patient's appetite.  Wife and caregiver report patient enjoys eating.  They have decreased the amount of eating out to 1 x weekly.  Patient ate a grilled cheese sandwich and 1/2 cup of soup with ice cream/cherry pie.   No issues with insomnia.  Napping during the day.  Continues with difficulty hearing.  Cerumen removed on last PCP visit.  No hearing aides.   Patient was treated with antibiotics recently for a UTI.  Patient denies any pain with urination or urgency.  He voids frequently and continues with incontinence.   Patient denies issues with constipation.  Not taking dulcolax per wife.  Unable to complete a  full medication review at this time.  Son-Mike is filling electric pill box and medications are put out of reach to limit confusion.  Wife denies aggressive behaviors in patient.  Ongoing cognitive decline noted in patient.  Asking for fork when needing a straw and does not recall  what he just ate for lunch.  Occasionally will argue with caregiver as he often forgets  things.  Short term memory deficits noted.        HISTORY OF PRESENT ILLNESS:  75 year old male with Dementia.  Patient is being followed by Palliative Care every 4-8 weeks and PRN.   CODE STATUS: DNR ADVANCED DIRECTIVES: No MOST FORM: No PPS: 50%   PHYSICAL EXAM:   VITALS: 150/78 70 18 95% LUNGS: clear to auscultation  CARDIAC: Cor RRR}  EXTREMITIES: - edema SKIN: lesion noted to right side of face.  Dermatology referral already in place for December.   NEURO: positive for memory problems       Lorenza Burton, RN

## 2021-09-30 ENCOUNTER — Telehealth: Payer: Self-pay

## 2021-09-30 NOTE — Telephone Encounter (Signed)
1225 pm.  Phone call made to wife [name] to schedule a home visit next Monday at 130 pm.  Advised NP with Palliative Care would connect virtually to complete this assessment visit to hopefully start PT for lower extremity strengthening.

## 2021-10-07 ENCOUNTER — Other Ambulatory Visit: Payer: PPO

## 2021-10-07 ENCOUNTER — Other Ambulatory Visit: Payer: PPO | Admitting: Student

## 2021-10-07 ENCOUNTER — Other Ambulatory Visit: Payer: Self-pay

## 2021-10-07 VITALS — BP 150/88 | HR 70 | Temp 97.5°F | Resp 18 | Ht 76.0 in | Wt 282.6 lb

## 2021-10-07 DIAGNOSIS — M25561 Pain in right knee: Secondary | ICD-10-CM

## 2021-10-07 DIAGNOSIS — Z515 Encounter for palliative care: Secondary | ICD-10-CM

## 2021-10-07 DIAGNOSIS — R531 Weakness: Secondary | ICD-10-CM | POA: Diagnosis not present

## 2021-10-07 DIAGNOSIS — M25562 Pain in left knee: Secondary | ICD-10-CM | POA: Diagnosis not present

## 2021-10-07 NOTE — Progress Notes (Signed)
[facility] Pall[facility] Consult Note Telephone: [phone]  Fax: [phone]   Date of encounter: 10/07/21 1:59 PM PATIENT NAME: Vincent Ayala [address] [place] [address]   [phone] (home)  DOB: June 11, 1946 MRN: [id number] PRIMARY CARE PROVIDER:    Jerrol Banana., MD,  [address] [place] [address] [phone]  REFERRING PROVIDER:   Jerrol Banana., MD [address] [place],  [place] [address] [phone]  RESPONSIBLE PARTY:    Contact Information     Name Relation Home Work Mobile   Ipava Spouse [phone]  [phone]   Bensyn, Born[place] [phone]         Due to the COVID-19 crisis, this visit was done via telemedicine from my office and it was initiated and consent by this patient and or family.  I connected with  Janine Ores OR PROXY on 10/07/21 by a video enabled telemedicine application and verified that I am speaking with the correct person using two identifiers.   I discussed the limitations of evaluation and management by telemedicine. The patient expressed understanding and agreed to proceed.   Joint Telehealth visit with Palliative RN Vance Gather and SW [name], present in the home.                                     ASSESSMENT AND PLAN / RECOMMENDATIONS:   Advance Care Planning/Goals of Care: Goals include to maximize quality of life and symptom management. Patient/health care surrogate gave his/her permission to discuss.Our advance care planning conversation included a discussion about:    The value and importance of advance care planning  Experiences with loved ones who have been seriously ill or have died  Exploration of personal, cultural or spiritual beliefs that might influence medical decisions  Exploration of goals of care in the event of a sudden injury or illness  Identification and preparation of a healthcare agent  Review and  updating or creation of an  advance directive document . Decision not to resuscitate or to de-escalate disease focused treatments due to poor prognosis. CODE STATUS:  DNR  Symptom Management/Plan:  Bilateral knee pain-recommend starting acetaminophen 650mg  BID for pain.  Generalized weakness-patient with increased weakness to bilateral lower extremities. Recommend PT to evaluate and treat due to bilateral LE weakness, increased difficulty ambulating, bilateral knee pain. Encourage use of cane with ambulation. Caregivers to continue assisting with adl's due to patient's dementia.  Follow up Palliative Care Visit: Palliative care will continue to follow for complex medical decision making, advance care planning, and clarification of goals. Return as needed.   This visit was coded based on medical decision making (MDM).  PPS: 50%  HOSPICE ELIGIBILITY/DIAGNOSIS: TBD  Chief Complaint: Pain and Lower Extremity Weakness.  HISTORY OF PRESENT ILLNESS:  Vincent Ayala is a 75 y.o. year old male  with HTN, Dementia, Frontotemperal Dementia, Depression, Decreased Hearing of both ears, Polymyalgia Rheumatica and Sleep Apnea.  Patient resides at home; wife and caregiver. He reports bilateral knee pain. He occassionally takes acetaminophen. Caregivers report increased weakness to lower extremities in the past couple of weeks. He has two canes in the home; no walker. No recent falls reported. Son prepares pill box for patient.   History obtained from review of EMR, discussion with primary team, and interview with family, facility staff/caregiver and/or Vincent Ayala.  I  reviewed available labs, medications, imaging, studies and related documents from the EMR.  Records reviewed and summarized above.   ROS  General: NAD EYES: denies vision changes ENMT: denies dysphagia, + hearing loss. Cardiovascular: denies chest pain, denies DOE Pulmonary: denies cough, denies increased SOB Abdomen: endorses good  appetite, denies constipation, endorses continence of bowel GU: denies dysuria,  incontinence of urine MSK:  + lower extremity weakness,  no falls reported Skin: denies rashes or wounds Neurological: + bilateral knee pain, denies insomnia Psych: Endorses positive mood Heme/lymph/immuno: denies bruises, abnormal bleeding  Physical Exam: Current Weight: 282.6 lbs Constitutional: NAD General: obese  EYES: lids intact, no discharge  ENMT: +hearing loss, oral mucous membranes moist, dentition intact CV: S1S2, RRR, no LE edema Pulmonary: LCTA, no increased work of breathing, no cough, room air Abdomen: intake 100%, normo-active BS + 4 quadrants, soft and non tender GU: deferred MSK: no sarcopenia, moves all extremities, ambulatory Skin: warm and dry, no rashes or wounds on visible skin Neuro:  no generalized weakness,  +cognitive impairment Psych: non-anxious affect, A and O x 2 Hem/lymph/immuno: no widespread bruising CURRENT PROBLEM LIST:  Patient Active Problem List   Diagnosis Date Noted   Closed right hip fracture, initial encounter ([place]) 07/06/2019   Dementia ([place]) 03/12/2016   Depression 03/12/2016   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   Sleep apnea 03/12/2016   Decreased hearing of both ears 03/12/2016   Hypertension 03/12/2016   Allergic rhinitis 03/12/2016   Osteoporosis 03/12/2016   Polymyalgia rheumatica ([place]) 03/12/2016   Cervical radiculopathy 03/12/2016   PAST MEDICAL HISTORY:  Active Ambulatory Problems    Diagnosis Date Noted   Dementia ([place]) 03/12/2016   Depression 03/12/2016   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)    SOCIAL HX:  Social History   Tobacco Use   Smoking status: Never   Smokeless tobacco: Never  Substance Use Topics   Alcohol use: No   FAMILY HX: No family history on file.    ALLERGIES:  Allergies  Allergen Reactions   Penicillins      PERTINENT MEDICATIONS:  Outpatient Encounter Medications as of 10/07/2021  Medication Sig   bisacodyl (DULCOLAX) 5 MG EC tablet Take 2 tablets (10 mg total) by mouth daily as needed for moderate constipation. (Patient not taking: Reported on 07/25/2021)   Calcium Carbonate-Vitamin D 600-200 MG-UNIT TABS Take 1 tablet by mouth daily.    calcium-vitamin D 250-100 MG-UNIT tablet Take 1 tablet by mouth 2 (two) times daily.   docusate sodium (COLACE) 100 MG capsule Take 1 capsule (100 mg total) by mouth 2 (two) times daily.   gabapentin (NEURONTIN) 300 MG capsule Take 1 capsule (300 mg total) by mouth 3 (three) times daily.   lamoTRIgine (LAMICTAL) 100 MG tablet Take 1 tablet (100 mg total) by mouth 2 (two) times daily.   lamoTRIgine (LAMICTAL) 150 MG tablet Take 150 mg by mouth 2 (two) times daily.   losartan (COZAAR) 50 MG tablet TAKE 1 TABLET BY MOUTH DAILY  Melatonin 3 MG TABS Take 6 mg by mouth at bedtime.    memantine (NAMENDA) 10 MG tablet Take 10 mg by mouth daily.    mirtazapine (REMERON) 15 MG tablet Take 1 tablet (15 mg total) by mouth at bedtime.   Multiple Vitamin (MULTIVITAMIN) tablet Take 1 tablet by mouth daily.   QUEtiapine (SEROQUEL) 50 MG tablet Take 50 mg by mouth 2 (two) times a day.   sertraline (ZOLOFT) 100 MG tablet TAKE ONE AND ONE-HALF TABLETS DAILY   traZODone (DESYREL) 50 MG tablet Take 50 mg by mouth at bedtime.    No facility-administered encounter medications on file as of 10/07/2021.   Thank you for the opportunity to participate in the  care of Mr. Even.  The palliative care team will continue to follow. Please call our office at [phone] if we can be of additional assistance.   Lorenza Burton, RN ,   COVID-19 PATIENT SCREENING TOOL Asked and negative response unless otherwise noted:  Have you had symptoms of covid, tested positive or been in contact with someone with symptoms/positive test in the past 5-10 days? No

## 2021-10-07 NOTE — Progress Notes (Signed)
COMMUNITY PALLIATIVE CARE SW NOTE  PATIENT NAME: Vincent Ayala. [place] DOB: 26-Aug-1946 MRN: [id number]  PRIMARY CARE PROVIDER: Jerrol Ayala., MD  RESPONSIBLE PARTY:  Acct ID - Guarantor Home Phone Work Phone Relationship Acct Type  [id number] Vincent [id number]  Self P/F     [address], [place], [place] [address]     PLAN OF CARE and INTERVENTIONS:              Joint PC visit conducted with RN and PC NP virtually in attendance to initiate [facility] PT services for patient due to weakness and knee pain.   NP advised patient to start Tylenol 650 mg BID (once in AM and once HS) to assist with knee pain. Patients son manages medications.   No additional needs identified at this time.     SOCIAL HX:  Social History   Tobacco Use   Smoking status: Never   Smokeless tobacco: Never  Substance Use Topics   Alcohol use: No    CODE STATUS: DNR ADVANCED DIRECTIVES: Y MOST FORM COMPLETE: Y HOSPICE EDUCATION PROVIDED: N  PPS: Patient is independent with all ADL's at this time. Caregiver provides meals. Patient is A&O to self only  with poor insight and judgement due to dementia.     Vincent Ayala, Vincent Ayala

## 2021-10-28 ENCOUNTER — Telehealth: Payer: Self-pay | Admitting: *Deleted

## 2021-10-28 ENCOUNTER — Other Ambulatory Visit: Payer: Self-pay | Admitting: *Deleted

## 2021-10-28 MED ORDER — SULFAMETHOXAZOLE-TRIMETHOPRIM 800-160 MG PO TABS: 1.0000 | ORAL_TABLET | Freq: Two times a day (BID) | ORAL | 0 refills | Status: DC

## 2021-10-28 NOTE — Telephone Encounter (Signed)
Copied from [place] [id number]. Topic: General - Other >> Oct 28, 2021 10:06 AM Yvette Rack wrote: Reason for CRM: Pt son [name] stated pt caregiver reports pt has an uti and he would like to request that an antibiotic be prescribed. Attempted to get pt scheduled for an appt but [name] declined and stated normally Dr. Rosanna Randy does not require pt to come in as this occurs frequently. [name] requests call back at [phone]

## 2021-10-28 NOTE — Telephone Encounter (Signed)
Patient's son [name] advised. Rx was sent to pharmacy.

## 2021-10-28 NOTE — Telephone Encounter (Signed)
Please Advise

## 2021-10-31 DIAGNOSIS — R350 Frequency of micturition: Secondary | ICD-10-CM | POA: Diagnosis not present

## 2021-11-01 ENCOUNTER — Telehealth: Payer: Self-pay

## 2021-11-01 ENCOUNTER — Other Ambulatory Visit: Payer: Self-pay

## 2021-11-01 DIAGNOSIS — R350 Frequency of micturition: Secondary | ICD-10-CM

## 2021-11-01 NOTE — Progress Notes (Signed)
Urine was dropped off this morning. Per daughter urine was colleted last night, she is not sure if patient has started antibiotic.

## 2021-11-01 NOTE — Telephone Encounter (Signed)
Copied from [place] [id number]. Topic: General - Other >> Nov 01, 2021 12:00 PM Leward Quan A wrote: Reason for CRM: Patient son Jennette Bill called back to inform Dr Rosanna Randy that patient started taking antibiotics on 10/28/21 when they were received. Please call [name] at [facility] [phone]

## 2021-11-04 NOTE — Telephone Encounter (Signed)
FYI

## 2021-11-07 LAB — SPECIMEN STATUS REPORT

## 2021-11-07 LAB — URINE CULTURE

## 2021-11-18 ENCOUNTER — Other Ambulatory Visit: Payer: Self-pay | Admitting: Family Medicine

## 2021-11-20 ENCOUNTER — Ambulatory Visit: Payer: PPO | Admitting: Dermatology

## 2021-12-26 DIAGNOSIS — Z515 Encounter for palliative care: Secondary | ICD-10-CM

## 2021-12-26 NOTE — Progress Notes (Signed)
COMMUNITY PALLIATIVE CARE SW NOTE  PATIENT NAME: Vincent Ayala. Salls DOB: Feb 12, 1946 MRN: [id number]  PRIMARY CARE PROVIDER: Jerrol Banana., MD  RESPONSIBLE PARTY: There is no guarantor information entered for this encounter.  Palliative care SW outreached patient to complete telephonic visit.       Patients wife, Vincent Ayala, provided update on medical condition and/or changes. Patient with recent UTI in Nov 2022. ABT have been completed. Patient is encouraged to increase fluid/water intake. Wife shares that patient is doing well overall.  Wife denies patient complaining of any pain.  No recent falls. Patient continues to be ambulatory and does not use AD but has cane and RW if needed. MIN A - SUP with ADL's such as bathing,    Wife shares that patients appetite is the same and that he eats very well.    Wife denies financial, food insecurities or issues with transportation. No other psychosocial needs. Patient and wife continue have private caregivers in place in the AM and PM. Patient and wife go out twice a week for lunch.  Wife appreciative of telephonic check in.    Palliative care will continue to monitor and assist with long term care planning as needed.        SOCIAL HX:  Social History   Tobacco Use   Smoking status: Never   Smokeless tobacco: Never  Substance Use Topics   Alcohol use: No    CODE STATUS: DNR ADVANCED DIRECTIVES: Y. Patient son Vincent Ayala is HCPOA MOST FORM COMPLETE:  Y HOSPICE EDUCATION PROVIDED: N   Time spent: 25 min     [place], [place]

## 2022-02-13 DIAGNOSIS — G3109 Other frontotemporal dementia: Secondary | ICD-10-CM | POA: Diagnosis not present

## 2022-02-13 DIAGNOSIS — F0280 Dementia in other diseases classified elsewhere without behavioral disturbance: Secondary | ICD-10-CM | POA: Diagnosis not present

## 2022-02-13 DIAGNOSIS — G4752 REM sleep behavior disorder: Secondary | ICD-10-CM | POA: Diagnosis not present

## 2022-02-13 DIAGNOSIS — F332 Major depressive disorder, recurrent severe without psychotic features: Secondary | ICD-10-CM | POA: Diagnosis not present

## 2022-02-24 ENCOUNTER — Telehealth: Payer: Self-pay | Admitting: Family Medicine

## 2022-02-24 NOTE — Telephone Encounter (Signed)
Copied from [place] [phone]. Topic: Medicare AWV ?>> Feb 24, 2022  9:41 AM Cher Nakai R wrote: ?Reason for CRM:  ?Left message for patient to call back and schedule Medicare Annual Wellness Visit (AWV) in office.  ? ?If not able to come in office, please offer to do virtually or by telephone.  ? ?Last AWV: 08/22/2020 ? ?Please schedule at anytime with [facility] Health Advisor. ? ?If any questions, please contact me at [phone] ?

## 2022-03-18 ENCOUNTER — Other Ambulatory Visit: Payer: PPO

## 2022-03-18 DIAGNOSIS — Z515 Encounter for palliative care: Secondary | ICD-10-CM

## 2022-03-18 NOTE — Progress Notes (Signed)
PATIENT NAME: Vincent Ayala ?DOB: February 10, 1946 ?MRN: [id number] ? ?PRIMARY CARE PROVIDER: Jerrol Banana., MD ? ?RESPONSIBLE PARTY:  ?Acct ID - Guarantor Home Phone Work Phone Relationship Acct Type  ?[id number] MAHAMADOU, WELTZ* [phone]  Self P/F  ?   [address], [place], [place] [address]  ? ?I connected with  [name] on 03/18/22 by telephone and verified that I am speaking with the correct person using two identifiers. ?  ?I discussed the limitations of evaluation and management by telemedicine. The patient expressed understanding and agreed to proceed.  ? ? Spoke with wife [name] to complete a telephonic visit.   ? ?Dementia:  Wife feels patient remains stable with no significant changes.   Patient is still able to communicate his needs, ambulatory with no recent falls, feeding himself and continues to shower and dress independently.  [name] advised short term memory is poor and patient often forgets he ate and will ask for more food. No behavioral issues reported.  Patient and wife are mostly remaining home now.  They previously would go out to eat 2-3 x weekly and now its 1-2 x a month.  [name] states patient is content to remain home.  He is no longer walking in the neighborhood but mostly watching television.  Continues with urinary incontinence and wearing depends.  Patient is able to mange his incontinence care at this time.   [name] endorses they have 1 private caregiver coming into the home 2 x daily now.  They previously had 2 private caregivers.  Patient's son also comes by daily to check on them.  [name] denies any new concerns or needs at this time.  Palliative Care will complete and in home visit next month.  Visit scheduled with wife.  ? ?HISTORY OF PRESENT ILLNESS:  [name] is a 76 y.o. year old male  with HTN, Dementia, Frontotemperal Dementia, Depression, Decreased Hearing of both ears, Polymyalgia Rheumatica and Sleep Apnea. ? ?CODE STATUS: DNR ?ADVANCED DIRECTIVES: No ?MOST  FORM: No ?PPS: 50% ? ? ? ? ? ? ? ?Lorenza Burton, RN ? ?

## 2022-04-14 ENCOUNTER — Other Ambulatory Visit: Payer: PPO

## 2022-04-14 VITALS — BP 142/80 | HR 70 | Temp 97.6°F | Resp 22

## 2022-04-14 DIAGNOSIS — Z515 Encounter for palliative care: Secondary | ICD-10-CM

## 2022-04-14 NOTE — Progress Notes (Addendum)
COMMUNITY PALLIATIVE CARE SW NOTE ? ?PATIENT NAME: Vincent Ayala ?DOB: 07/22/1946 ?MRN: [id number] ? ?PRIMARY CARE PROVIDER: Gilbert, Richard L Jr., MD ? ?RESPONSIBLE PARTY:  ?Acct ID - Guarantor Home Phone Work Phone Relationship Acct Type  ?[id number] - Mohon,Omario* [phone]  Self P/F  ?   [address], [place],  [address]  ? ? ? ?PLAN OF CARE and INTERVENTIONS:             ? ?GOALS OF CARE/ ADVANCE CARE PLANNING: Goals include to maximize quality of life and symptom management. Our advance care planning conversation included a discussion about:    ?The value and importance of advance care planning  ?Review and updating or creation of an advance directive document.  ? Patient is a DNR.  ?  ?Palliative care encounter: Palliative medicine team will continue to support patient, patient's family, and medical team. Visit consisted of counseling and education dealing with the complex and emotionally intense issues of symptom management and palliative care in the setting of serious and potentially life-threatening illness. ? ?SW and RN met with patient and [name] (patient's wife) for monthly in home visit. Patient lives in senior apartment complex on second floor with spouse. Patient has progressing dementia. ? ?Functional changes/updates: Patient has not had any functional changes since his recent PC visit. Patient denies recent falls. Patient has a shuffle gait and unsteady on his feet. Fall and safety precautions discussed. Patient continues to be independent-[name] with all ADL's, as he requires some assistance with bathing and incontinence support periodically. Patient reports pain in knees periodically but denies taking pain reliever. Spouse share that she feels that she nor patient needs in home caregivers any longer, as they are doing fine and doing everything on their own. ? ?Home & Environment assessment: No concerns. Patient feels safe in his home environment. Patients home is one level. Patient is  able to maneuver around her home without issue.  ? ?Protein calorie malnutrition/weight loss/Appetite: Patient's appetite is good. No weight loss reported.  ? ?Psychosocial assessment: completed. No additional physical needs identified at this time. Ongoing support/resources will continued to be offered if needed. Life alert system discussed with spouse. ? ?Son visited during visit and shared that he has had contact with [facility] for both patient and spouse. Son shared that Medicaid may cover for spouse at [facility] but patient would be private pay. ? ?Military hx: N/A  ? ?Medical assessment: RN reviewed medications and took vitals.  ? ?Hospice discussion: N/A ? ?SW discussed goals, reviewed care plan, provided emotional support, used active and reflective listening in the form of reciprocity emotional response. Questions and concerns were addressed. The patient/family was encouraged to call with any additional questions and/or concerns. PC Provided general support and encouragement, no other unmet needs identified. Will continue to follow. ? ?3.         PATIENT/CAREGIVER EDUCATION/ COPING:   ?Appearance: well groomed, appropriate given situation  ?Mental Status: Alert/oriented to self ?Eye Contact: Good. Able to engage in proper eye contact  ?Thought Process: irrational  ?Thought Content: not assessed  ?Speech: Normal rate, volume, tone  ?Mood: Normal and calm ?Affect: Congruent to endorsed mood, full ranging ?Insight: normal ?Judgement: normal  ?Interaction Style: Cooperative ? ?Patient A&O to self with consistent cognitive/memory deficits. Patient engaged in fluent conversation and answered all questions mostly appropriately. Patient's mood was jovial during visit today. PHQ 9 is 0. Patient enjoys going out to lunch once a week with spouse and friend. ? ?  Memory deficits witnessed in spouse as well and she tends to repeat herself during conversations.  ? ?4.         PERSONAL EMERGENCY PLAN:  Patient/spouse  will call 9-1-1 for emergencies.   ? ?5.         COMMUNITY RESOURCES COORDINATION/ HEALTH CARE NAVIGATION:  spouse and son manages his care. ? ?6.           FINANCIAL CONCERNS/NEEDS: None. ?                        Primary Health Insurance: HTA Medicare ?Secondary Health Insurance: N/A ?Prescription Coverage: Yes, no history of difficulty obtaining or affording prescriptions reported ?   ? ?SOCIAL HX:  ?Social History  ? ?Tobacco Use  ? Smoking status: Never  ? Smokeless tobacco: Never  ?Substance Use Topics  ? Alcohol use: No  ? ? ?CODE STATUS: DNR ?ADVANCED DIRECTIVES: Y ?MOST FORM COMPLETE: N ?HOSPICE EDUCATION PROVIDED: N ? ?PPS: Patient is independent with ADL's. Patient as poor insight and judgement. ? ? ?Time spent: 45 min  ? ? ? ? ? ? , LCSW ? ?

## 2022-04-14 NOTE — Progress Notes (Signed)
PATIENT NAME: Vincent Ayala ?DOB: 1946/01/08 ?MRN: [id number] ? ?PRIMARY CARE PROVIDER: Jerrol Banana., MD ? ?RESPONSIBLE PARTY:  ?Acct ID - Guarantor Home Phone Work Phone Relationship Acct Type  ?[id number] ALEKSA, CATTERTON* [phone]  Self P/F  ?   [address], [place], [place] [address]  ? ? ?PLAN OF CARE and INTERVENTIONS: ?              1.  GOALS OF CARE/ ADVANCE CARE PLANNING:   ?              2.  PATIENT/CAREGIVER EDUCATION:  Palliative Care, Disease Progression.  ?              4. PERSONAL EMERGENCY PLAN:  Activate 911 for emergencies. ?              5.  DISEASE STATUS:  Both wife and patient have short term deficits.  ? ?Dental:  Patient advises he only has 3 teeth.  He does not recall seeing a dentist.  Denies any issues with consuming regular foods.  ? ?Disease Progression:  Discussed patient's overall condition.  Education provided on disease progression related to Dementia.  ? ?Mobility:  Patient is ambulatory without assistive devices.  He has a rolling walker but does not remember to use this.  No falls are reported by patient or wife. ? ?Knee Pain:  Patient endorses having occasional knee pain but he does not feel he needs any medication for this.  NP's last visit  made recommendations for Tylenol.  ? ?Placement:  Son [name] arrived towards the end of this visit.  He advised he is currently working to get patient admitted to [facility] due to her declining cognitive state.  Patient will likely be placed at the same facility if financially feasible.  ? ?HISTORY OF PRESENT ILLNESS:  Vincent Ayala is a 76 y.o. year old male  with HTN, Dementia, Frontotemperal Dementia, Depression, Decreased Hearing of both ears, Polymyalgia Rheumatica and Sleep Apnea.  Patient is followed by Palliative Care every 8-12 weeks and PRN.  ? ?CODE STATUS: DNR ?ADVANCED DIRECTIVES: No ?MOST FORM: No ?PPS: 50% ? ? ?PHYSICAL EXAM:  ? ?VITALS: ?Today's Vitals  ? 04/14/22 1315  ?BP: (!) 142/80  ?Pulse: 70  ?Resp: (!)  22  ?Temp: 97.6 ?F (36.4 ?C)  ?SpO2: 97%  ?PainSc: 0-No pain  ?  ?LUNGS: clear to auscultation  ?CARDIAC: Cor RRR}  ?EXTREMITIES: - for edema ?SKIN: Skin color, texture, turgor normal. No rashes or lesions or mobility and turgor normal  ?NEURO: positive for gait problems and memory problems ? ? ? ? ? ? ?Lorenza Burton, RN ? ?

## 2022-05-16 ENCOUNTER — Other Ambulatory Visit: Payer: Self-pay | Admitting: Family Medicine

## 2022-05-26 ENCOUNTER — Ambulatory Visit: Payer: PPO | Admitting: Family Medicine

## 2022-05-26 NOTE — Progress Notes (Deleted)
Established patient visit   Patient: Vincent Ayala. [name]   DOB: 07-25-1946   76 y.o. Male  MRN: [id number] Visit Date: 05/26/2022  Today's healthcare provider: Wilhemena Durie, MD   No chief complaint on file.  Subjective    HPI  Hypertension, follow-up  BP Readings from Last 3 Encounters:  04/14/22 (!) 142/80  10/07/21 (!) 150/88  09/26/21 (!) 150/78   Wt Readings from Last 3 Encounters:  10/07/21 282 lb 9.6 oz (128.2 kg)  07/25/21 280 lb 3.2 oz (127.1 kg)  03/28/21 271 lb (122.9 kg)     He was last seen for hypertension 10 months ago.  Management since that visit includes; Controlled on losartan.  Outside blood pressures are {***enter patient reported home BP readings, or 'not being checked':1}.  Pertinent labs Lab Results  Component Value Date   CHOL 134 07/12/2018   HDL 45 07/12/2018   LDLCALC 59 07/12/2018   TRIG 150 (H) 07/12/2018   CHOLHDL 3.0 07/12/2018   Lab Results  Component Value Date   NA 141 07/25/2021   K 3.9 07/25/2021   CREATININE 1.37 (H) 07/25/2021   EGFR 53 (L) 07/25/2021   GLUCOSE 127 (H) 07/25/2021   TSH 0.722 07/25/2021     The ASCVD Risk score (Arnett DK, et al., 2019) failed to calculate for the following reasons:   Cannot find a previous HDL lab   Cannot find a previous total cholesterol lab  --------------------------------------------------------------------------------------------------- Depression, Follow-up  He  was last seen for this 10 months ago. Changes made at last visit include; On sertraline and Remeron.  Continue this for the time being.      07/25/2021    3:59 PM 08/22/2020   11:04 AM 04/28/2019    1:38 PM  Depression screen PHQ 2/9  Decreased Interest 1 0 0  Down, Depressed, Hopeless 1 0 0  PHQ - 2 Score 2 0 0  Altered sleeping 1    Tired, decreased energy 1    Change in appetite 1    Feeling bad or failure about yourself  0    Trouble concentrating 1    Moving slowly or fidgety/restless 1    Suicidal  thoughts 0    PHQ-9 Score 7    Difficult doing work/chores Extremely dIfficult      -----------------------------------------------------------------------------------------   Medications: Outpatient Medications Prior to Visit  Medication Sig   bisacodyl (DULCOLAX) 5 MG EC tablet Take 2 tablets (10 mg total) by mouth daily as needed for moderate constipation. (Patient not taking: Reported on 07/25/2021)   Calcium Carbonate-Vitamin D 600-200 MG-UNIT TABS Take 1 tablet by mouth daily.    calcium-vitamin D 250-100 MG-UNIT tablet Take 1 tablet by mouth 2 (two) times daily.   docusate sodium (COLACE) 100 MG capsule Take 1 capsule (100 mg total) by mouth 2 (two) times daily.   gabapentin (NEURONTIN) 300 MG capsule Take 1 capsule (300 mg total) by mouth 3 (three) times daily.   lamoTRIgine (LAMICTAL) 100 MG tablet Take 1 tablet (100 mg total) by mouth 2 (two) times daily.   lamoTRIgine (LAMICTAL) 150 MG tablet Take 150 mg by mouth 2 (two) times daily.   losartan (COZAAR) 50 MG tablet TAKE 1 TABLET BY MOUTH DAILY   Melatonin 3 MG TABS Take 6 mg by mouth at bedtime.    memantine (NAMENDA) 10 MG tablet Take 10 mg by mouth daily.    mirtazapine (REMERON) 15 MG tablet Take 1 tablet (15 mg total) by  mouth at bedtime.   Multiple Vitamin (MULTIVITAMIN) tablet Take 1 tablet by mouth daily.   QUEtiapine (SEROQUEL) 50 MG tablet Take 50 mg by mouth 2 (two) times a day.   sertraline (ZOLOFT) 100 MG tablet TAKE ONE AND ONE-HALF TABLETS DAILY   sulfamethoxazole-trimethoprim (BACTRIM DS) 800-160 MG tablet Take 1 tablet by mouth 2 (two) times daily.   traZODone (DESYREL) 50 MG tablet Take 50 mg by mouth at bedtime.    No facility-administered medications prior to visit.    Review of Systems  Constitutional:  Negative for appetite change, chills and fever.  Respiratory:  Negative for chest tightness, shortness of breath and wheezing.   Cardiovascular:  Negative for chest pain and palpitations.   Gastrointestinal:  Negative for abdominal pain, nausea and vomiting.    {Labs  Heme  Chem  Endocrine  Serology  Results Review (optional):23779}   Objective    There were no vitals taken for this visit. {Show previous vital signs (optional):23777}  Physical Exam  ***  No results found for any visits on 05/26/22.  Assessment & Plan     ***  No follow-ups on file.      {provider attestation***:1}   Wilhemena Durie, MD  [facility] [phone] (phone) [phone] (fax)  [place]

## 2022-06-18 ENCOUNTER — Telehealth: Payer: Self-pay

## 2022-06-18 NOTE — Telephone Encounter (Signed)
Palliative care SW outreached patient/family for monthly telephonic visit and to schedule in person visit.   Call unsuccessful. SW left HIPPA compliant VM.  Awaiting return call.

## 2022-07-09 ENCOUNTER — Other Ambulatory Visit: Payer: PPO

## 2022-07-09 DIAGNOSIS — Z515 Encounter for palliative care: Secondary | ICD-10-CM

## 2022-07-09 NOTE — Progress Notes (Signed)
PATIENT NAME: Vincent Ayala. Hargrove DOB: [birth year] MRN: [id number]  PRIMARY CARE PROVIDER: Jerrol Banana., MD  RESPONSIBLE PARTY:  Acct ID - Guarantor Home Phone Work Phone Relationship Acct Type  [id number] Vincent Ayala, [phone]  Self P/F     [address] [place], [place], [place] [address]    I connected with  [name] on 07/09/22 by telephone and verified that I am speaking with the correct person using two identifiers.   I discussed the limitations of evaluation and management by telemedicine. The patient expressed understanding and agreed to proceed.   Connected with both patient and wife [name] to complete a telephonic visit.  Appetite:  No changes in appetite.  Patient currently snacking on chips.  Continues with 3 meals daily.  Dementia:  Difficult to assess as wife also has memory deficits.  Patient is still have to dress, shower and groom himself daily.  Private caregivers are in the home daily to assist with housekeeping and ensure patient and wife are safe.   Mobility:  Patient remains ambulatory without assistive devices.  He has a walker in the home but does not remember to use this.  No recent falls that the wife can recall.  Follow Up:  Palliative Care will follow up next month and schedule a home visit.   HISTORY OF PRESENT ILLNESS:  76 year old male with Frontotemporal Dementia.  Patient is followed by Palliative Care every 4-8 weeks and PRN.   CODE STATUS: DNR ADVANCED DIRECTIVES: No MOST FORM: No PPS: 50%        Lorenza Burton, RN

## 2022-07-25 ENCOUNTER — Telehealth: Payer: Self-pay | Admitting: Family Medicine

## 2022-07-25 NOTE — Telephone Encounter (Signed)
Pt's son requesting a fl2 form to admit patient to [facility]  Please fu w/ son

## 2022-08-04 ENCOUNTER — Encounter: Payer: Self-pay | Admitting: Family Medicine

## 2022-08-04 ENCOUNTER — Ambulatory Visit (INDEPENDENT_AMBULATORY_CARE_PROVIDER_SITE_OTHER): Payer: PPO | Admitting: Family Medicine

## 2022-08-04 VITALS — BP 137/83 | HR 73 | Resp 16 | Ht 75.0 in | Wt 283.0 lb

## 2022-08-04 DIAGNOSIS — F3289 Other specified depressive episodes: Secondary | ICD-10-CM | POA: Diagnosis not present

## 2022-08-04 DIAGNOSIS — I10 Essential (primary) hypertension: Secondary | ICD-10-CM | POA: Diagnosis not present

## 2022-08-04 DIAGNOSIS — G3109 Other frontotemporal dementia: Secondary | ICD-10-CM | POA: Diagnosis not present

## 2022-08-04 DIAGNOSIS — F0280 Dementia in other diseases classified elsewhere without behavioral disturbance: Secondary | ICD-10-CM

## 2022-08-04 DIAGNOSIS — H9193 Unspecified hearing loss, bilateral: Secondary | ICD-10-CM | POA: Diagnosis not present

## 2022-08-04 DIAGNOSIS — Z111 Encounter for screening for respiratory tuberculosis: Secondary | ICD-10-CM | POA: Diagnosis not present

## 2022-08-04 NOTE — Progress Notes (Unsigned)
Established patient visit  I,Vincent Ayala,acting as a scribe for Wilhemena Durie, MD.,have documented all relevant documentation on the behalf of Wilhemena Durie, MD,as directed by  Wilhemena Durie, MD while in the presence of Wilhemena Durie, MD.   Patient: Vincent Ayala. Vincent Ayala   DOB: 1946-11-28   76 y.o. Male  MRN: [id number] Visit Date: 08/04/2022  Today's healthcare provider: Wilhemena Durie, MD   No chief complaint on file.  Subjective    HPI  Patient is a 76 year old male who presents to have FL2 form filled out. His wife has progressively severe dementia and the patient has had frontotemporal dementia for 15 years that has been unbelievably stable for the past few years.  His behavior is controlled by mood stabilizers. I Agree that he does need a memory care unit.  Medications: Outpatient Medications Prior to Visit  Medication Sig   Calcium Carbonate-Vitamin D 600-200 MG-UNIT TABS Take 1 tablet by mouth daily.    lamoTRIgine (LAMICTAL) 150 MG tablet Take 150 mg by mouth 2 (two) times daily.   Melatonin 3 MG TABS Take 6 mg by mouth at bedtime.    memantine (NAMENDA) 10 MG tablet Take 10 mg by mouth daily.    QUEtiapine (SEROQUEL) 50 MG tablet Take 50 mg by mouth 2 (two) times a day.   rivastigmine (EXELON) 1.5 MG capsule TAKE 1 CAPSULE BY MOUTH TWICE DAILY WITHMEALS   sertraline (ZOLOFT) 100 MG tablet TAKE ONE AND ONE-HALF TABLETS DAILY   bisacodyl (DULCOLAX) 5 MG EC tablet Take 2 tablets (10 mg total) by mouth daily as needed for moderate constipation. (Patient not taking: Reported on 07/25/2021)   docusate sodium (COLACE) 100 MG capsule Take 1 capsule (100 mg total) by mouth 2 (two) times daily. (Patient not taking: Reported on 08/04/2022)   gabapentin (NEURONTIN) 300 MG capsule Take 1 capsule (300 mg total) by mouth 3 (three) times daily. (Patient not taking: Reported on 08/04/2022)   losartan (COZAAR) 50 MG tablet TAKE 1 TABLET BY MOUTH DAILY (Patient not taking:  Reported on 08/04/2022)   mirtazapine (REMERON) 15 MG tablet Take 1 tablet (15 mg total) by mouth at bedtime. (Patient not taking: Reported on 08/04/2022)   Multiple Vitamin (MULTIVITAMIN) tablet Take 1 tablet by mouth daily. (Patient not taking: Reported on 08/04/2022)   traZODone (DESYREL) 50 MG tablet Take 50 mg by mouth at bedtime.  (Patient not taking: Reported on 08/04/2022)   [DISCONTINUED] calcium-vitamin D 250-100 MG-UNIT tablet Take 1 tablet by mouth 2 (two) times daily.   [DISCONTINUED] lamoTRIgine (LAMICTAL) 100 MG tablet Take 1 tablet (100 mg total) by mouth 2 (two) times daily. (Patient not taking: Reported on 08/04/2022)   [DISCONTINUED] sulfamethoxazole-trimethoprim (BACTRIM DS) 800-160 MG tablet Take 1 tablet by mouth 2 (two) times daily. (Patient not taking: Reported on 08/04/2022)   No facility-administered medications prior to visit.    Review of Systems  Constitutional:  Negative for appetite change, chills and fever.  Respiratory:  Negative for chest tightness, shortness of breath and wheezing.   Cardiovascular:  Negative for chest pain and palpitations.  Gastrointestinal:  Negative for abdominal pain, nausea and vomiting.    Last metabolic panel Lab Results  Component Value Date   GLUCOSE 102 (H) 08/04/2022   NA 141 08/04/2022   K 4.3 08/04/2022   CL 104 08/04/2022   CO2 24 08/04/2022   BUN 22 08/04/2022   CREATININE 1.51 (H) 08/04/2022   EGFR 47 (L)  08/04/2022   CALCIUM 9.8 08/04/2022   PROT 6.3 08/04/2022   ALBUMIN 4.3 08/04/2022   LABGLOB 2.0 08/04/2022   AGRATIO 2.2 08/04/2022   BILITOT 0.3 08/04/2022   ALKPHOS 81 08/04/2022   AST 15 08/04/2022   ALT 13 08/04/2022   ANIONGAP 5 07/09/2019       Objective    BP 137/83 (BP Location: Right Arm, Patient Position: Sitting, Cuff Size: Large)   Pulse 73   Resp 16   Ht 6' 3" (1.905 m)   Wt 283 lb (128.4 kg)   SpO2 97%   BMI 35.37 kg/m  BP Readings from Last 3 Encounters:  08/04/22 137/83  04/14/22 (!)  142/80  10/07/21 (!) 150/88   Wt Readings from Last 3 Encounters:  08/04/22 283 lb (128.4 kg)  10/07/21 282 lb 9.6 oz (128.2 kg)  07/25/21 280 lb 3.2 oz (127.1 kg)      Physical Exam Vitals reviewed.  Constitutional:      Appearance: He is well-developed.  HENT:     Head: Normocephalic and atraumatic.     Right Ear: External ear normal.     Left Ear: External ear normal.     Nose: Nose normal.     Mouth/Throat:     Comments: Poor dentition Eyes:     General:        Left eye: No discharge.     Conjunctiva/sclera: Conjunctivae normal.  Neck:     Thyroid: No thyromegaly.  Cardiovascular:     Rate and Rhythm: Normal rate and regular rhythm.     Heart sounds: Normal heart sounds.  Pulmonary:     Effort: Pulmonary effort is normal.     Breath sounds: Normal breath sounds.  Abdominal:     Palpations: Abdomen is soft.     Tenderness: There is no abdominal tenderness.  Musculoskeletal:     Comments: Ambulating well today.    Skin:    General: Skin is warm and dry.  Neurological:     General: No focal deficit present.     Mental Status: He is alert.  Psychiatric:        Mood and Affect: Mood normal.        Behavior: Behavior normal.       No results found for any visits on 08/04/22.  Assessment & Plan     1. Frontotemporal dementia (HCC) FL 2 filled out for memory care unit - CBC w/Diff/Platelet - Comprehensive Metabolic Panel (CMET) - TSH  2. Screening-pulmonary TB  - QuantiFERON-TB Gold Plus - CBC w/Diff/Platelet - Comprehensive Metabolic Panel (CMET) - TSH  3. Essential hypertension  - CBC w/Diff/Platelet - Comprehensive Metabolic Panel (CMET) - TSH  4. Other depression On sertraline  5. Decreased hearing of both ears    No follow-ups on file.      I,   Jr, MD, have reviewed all documentation for this visit. The documentation on 08/06/22 for the exam, diagnosis, procedures, and orders are all accurate and  complete.      Jr, MD   Family Practice [phone] (phone) [phone] (fax)  [facility] 

## 2022-08-07 LAB — CBC WITH DIFFERENTIAL/PLATELET
Basophils Absolute: 0.1 10*3/uL (ref 0.0–0.2)
Basos: 1 %
EOS (ABSOLUTE): 0.3 10*3/uL (ref 0.0–0.4)
Eos: 5 %
Hematocrit: 43.8 % (ref 37.5–51.0)
Hemoglobin: 14.7 g/dL (ref 13.0–17.7)
Immature Grans (Abs): 0 10*3/uL (ref 0.0–0.1)
Immature Granulocytes: 0 %
Lymphocytes Absolute: 1.7 10*3/uL (ref 0.7–3.1)
Lymphs: 27 %
MCH: 30.6 pg (ref 26.6–33.0)
MCHC: 33.6 g/dL (ref 31.5–35.7)
MCV: 91 fL (ref 79–97)
Monocytes Absolute: 0.7 10*3/uL (ref 0.1–0.9)
Monocytes: 11 %
Neutrophils Absolute: 3.4 10*3/uL (ref 1.4–7.0)
Neutrophils: 56 %
Platelets: 298 10*3/uL (ref 150–450)
RBC: 4.8 x10E6/uL (ref 4.14–5.80)
RDW: 13.2 % (ref 11.6–15.4)
WBC: 6.2 10*3/uL (ref 3.4–10.8)

## 2022-08-07 LAB — COMPREHENSIVE METABOLIC PANEL
ALT: 13 IU/L (ref 0–44)
AST: 15 IU/L (ref 0–40)
Albumin/Globulin Ratio: 2.2 (ref 1.2–2.2)
Albumin: 4.3 g/dL (ref 3.8–4.8)
Alkaline Phosphatase: 81 IU/L (ref 44–121)
BUN/Creatinine Ratio: 15 (ref 10–24)
BUN: 22 mg/dL (ref 8–27)
Bilirubin Total: 0.3 mg/dL (ref 0.0–1.2)
CO2: 24 mmol/L (ref 20–29)
Calcium: 9.8 mg/dL (ref 8.6–10.2)
Chloride: 104 mmol/L (ref 96–106)
Creatinine, Ser: 1.51 mg/dL — ABNORMAL HIGH (ref 0.76–1.27)
Globulin, Total: 2 g/dL (ref 1.5–4.5)
Glucose: 102 mg/dL — ABNORMAL HIGH (ref 70–99)
Potassium: 4.3 mmol/L (ref 3.5–5.2)
Sodium: 141 mmol/L (ref 134–144)
Total Protein: 6.3 g/dL (ref 6.0–8.5)
eGFR: 47 mL/min/{1.73_m2} — ABNORMAL LOW (ref >59)

## 2022-08-07 LAB — QUANTIFERON-TB GOLD PLUS
QuantiFERON Mitogen Value: >10 IU/mL
QuantiFERON Nil Value: 0.04 IU/mL
QuantiFERON TB1 Ag Value: 0.05 IU/mL
QuantiFERON TB2 Ag Value: 0.04 IU/mL
QuantiFERON-TB Gold Plus: NEGATIVE

## 2022-08-07 LAB — TSH: TSH: 1.01 u[IU]/mL (ref 0.450–4.500)

## 2022-08-08 NOTE — Telephone Encounter (Signed)
Returned call to [employer]. Advised TB results just came back. However provider still needs to sign diet form.

## 2022-08-08 NOTE — Telephone Encounter (Signed)
Amber from [facility] called and they are Missing the pts Blood TB test results and the addendum to the FL2 (his diet order) signed and sent back / fax# [phone]/ please advise

## 2022-08-11 ENCOUNTER — Other Ambulatory Visit: Payer: PPO

## 2022-08-11 DIAGNOSIS — Z789 Other specified health status: Secondary | ICD-10-CM

## 2022-08-11 NOTE — Progress Notes (Signed)
PC SW outreached patients son, Vincent Ayala, for patien t update.  Son shared that patient and his spouse will be transitioning to [facility]F on 08/19/22. Per son, this will be the best long term plan  for both patient and spouse as spouse's cognition is also declining.   Son would like for [facility] services to continue at ALF. Patient will be followed by in house provider. SW advised son to request new PC referral  from in house provider.   Son had no current questions/concerns about the transition process for patient at this time.

## 2022-08-11 NOTE — Telephone Encounter (Signed)
Form completed and faxed. 

## 2022-08-13 ENCOUNTER — Telehealth: Payer: Self-pay

## 2022-08-13 NOTE — Telephone Encounter (Signed)
Copied from [place] [id number]. Topic: General - Inquiry >> Aug 13, 2022 11:30 AM Erskine Squibb wrote: Reason for CRM: The patients son, [name] called in stating if possible he really needs an original letter written by his provider stating the patient cannot handle his own financial situation or affairs. He states they have moved his father into a memory care facility. This letter can be in the form of to whom it may concern. He needs to take this to the Social Security office. Please assist further.

## 2022-08-20 NOTE — Telephone Encounter (Signed)
Pts son checking status of letter, he stated he was hoping to pick up letter this week  Please fu w/ son w/ a status update

## 2022-08-20 NOTE — Telephone Encounter (Signed)
Please write ;letter pt is NOT capable to handle his own financial matters.

## 2022-08-21 DIAGNOSIS — F02C11 Dementia in other diseases classified elsewhere, severe, with agitation: Secondary | ICD-10-CM | POA: Diagnosis not present

## 2022-08-21 DIAGNOSIS — G3109 Other frontotemporal dementia: Secondary | ICD-10-CM | POA: Diagnosis not present

## 2022-08-21 DIAGNOSIS — M79605 Pain in left leg: Secondary | ICD-10-CM | POA: Diagnosis not present

## 2022-08-21 DIAGNOSIS — E663 Overweight: Secondary | ICD-10-CM | POA: Diagnosis not present

## 2022-08-21 DIAGNOSIS — F330 Major depressive disorder, recurrent, mild: Secondary | ICD-10-CM | POA: Diagnosis not present

## 2022-08-21 DIAGNOSIS — Z9183 Wandering in diseases classified elsewhere: Secondary | ICD-10-CM | POA: Diagnosis not present

## 2022-08-21 DIAGNOSIS — I10 Essential (primary) hypertension: Secondary | ICD-10-CM | POA: Diagnosis not present

## 2022-08-21 DIAGNOSIS — Z6832 Body mass index (BMI) 32.0-32.9, adult: Secondary | ICD-10-CM | POA: Diagnosis not present

## 2022-08-21 DIAGNOSIS — Z66 Do not resuscitate: Secondary | ICD-10-CM | POA: Diagnosis not present

## 2022-08-21 DIAGNOSIS — R269 Unspecified abnormalities of gait and mobility: Secondary | ICD-10-CM | POA: Diagnosis not present

## 2022-08-21 DIAGNOSIS — F5101 Primary insomnia: Secondary | ICD-10-CM | POA: Diagnosis not present

## 2022-08-21 NOTE — Telephone Encounter (Signed)
Son has been advised

## 2022-08-25 DIAGNOSIS — G3109 Other frontotemporal dementia: Secondary | ICD-10-CM | POA: Diagnosis not present

## 2022-08-25 DIAGNOSIS — F330 Major depressive disorder, recurrent, mild: Secondary | ICD-10-CM | POA: Diagnosis not present

## 2022-08-25 DIAGNOSIS — F5101 Primary insomnia: Secondary | ICD-10-CM | POA: Diagnosis not present

## 2022-08-25 DIAGNOSIS — Z9183 Wandering in diseases classified elsewhere: Secondary | ICD-10-CM | POA: Diagnosis not present

## 2022-08-25 DIAGNOSIS — E663 Overweight: Secondary | ICD-10-CM | POA: Diagnosis not present

## 2022-08-25 DIAGNOSIS — M79605 Pain in left leg: Secondary | ICD-10-CM | POA: Diagnosis not present

## 2022-08-25 DIAGNOSIS — Z6837 Body mass index (BMI) 37.0-37.9, adult: Secondary | ICD-10-CM | POA: Diagnosis not present

## 2022-08-25 DIAGNOSIS — I10 Essential (primary) hypertension: Secondary | ICD-10-CM | POA: Diagnosis not present

## 2022-08-25 DIAGNOSIS — R269 Unspecified abnormalities of gait and mobility: Secondary | ICD-10-CM | POA: Diagnosis not present

## 2022-08-25 DIAGNOSIS — F02C11 Dementia in other diseases classified elsewhere, severe, with agitation: Secondary | ICD-10-CM | POA: Diagnosis not present

## 2022-08-28 DIAGNOSIS — M79605 Pain in left leg: Secondary | ICD-10-CM | POA: Diagnosis not present

## 2022-08-29 DIAGNOSIS — I10 Essential (primary) hypertension: Secondary | ICD-10-CM | POA: Diagnosis not present

## 2022-09-05 ENCOUNTER — Non-Acute Institutional Stay: Payer: PPO | Admitting: Student

## 2022-09-05 DIAGNOSIS — I129 Hypertensive chronic kidney disease with stage 1 through stage 4 chronic kidney disease, or unspecified chronic kidney disease: Secondary | ICD-10-CM | POA: Diagnosis not present

## 2022-09-05 DIAGNOSIS — M79605 Pain in left leg: Secondary | ICD-10-CM

## 2022-09-05 DIAGNOSIS — F3289 Other specified depressive episodes: Secondary | ICD-10-CM | POA: Diagnosis not present

## 2022-09-05 DIAGNOSIS — F331 Major depressive disorder, recurrent, moderate: Secondary | ICD-10-CM | POA: Diagnosis not present

## 2022-09-05 DIAGNOSIS — Z515 Encounter for palliative care: Secondary | ICD-10-CM

## 2022-09-05 DIAGNOSIS — F0280 Dementia in other diseases classified elsewhere without behavioral disturbance: Secondary | ICD-10-CM

## 2022-09-05 DIAGNOSIS — G3109 Other frontotemporal dementia: Secondary | ICD-10-CM | POA: Diagnosis not present

## 2022-09-05 NOTE — Progress Notes (Signed)
[facility] Consult Note Telephone: [phone]  Fax: [phone]    Date of encounter: 09/05/22 4:49 PM PATIENT NAME: Vincent Ayala [address] [place] [address]   [phone] (home)  DOB: 21-Jun-1946 MRN: [id number] PRIMARY CARE PROVIDER:    Jerrol Banana., MD,  [address] [place] [address] [phone]  REFERRING PROVIDER:   Virgie Dad, NP-[facility]  RESPONSIBLE PARTY:    Contact Information     Name Relation Home Work Mobile   Vincent Ayala Spouse [phone]  [phone]   Vincent Ayala, Vincent Ayala [phone]          I met face to face with patient in the facility. Palliative Care was asked to follow this patient by consultation request of  Virgie Dad, NP-[facility] to address advance care planning and complex medical decision making. This is a follow up visit.                                   ASSESSMENT AND PLAN / RECOMMENDATIONS:   Advance Care Planning/Goals of Care: Goals include to maximize quality of life and symptom management. Patient/health care surrogate gave his/her permission to discuss. CODE STATUS: DNR  Education provided on Palliative Medicine. Will continue to provide supportive care, symptom management as needed.   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 He is adjusting well per staff, eating well. Will monitor for cognitive and functional declines. He has orders for PT/OT. Staff to reorient and redirect as needed, monitor for falls/safety. Monitor for agitation, behaviors. Continue memantine 10 mg daily, rivastigmine 1.5 mg BID, Seroquel 75 mg BID; trazodone PRN agitation.  Depression-mood has been stable. Continue sertraline 150 mg daily, lamotrigine 150 mg BID.  Pain-c/o left leg pain. Continue acetaminophen 650 mg TID; therapy as directed.   Follow up Palliative Care Visit: Palliative care will continue  to follow for complex medical decision making, advance care planning, and clarification of goals. Return in 6-8 weeks or prn.   This visit was coded based on medical decision making (MDM).  PPS: 50%  HOSPICE ELIGIBILITY/DIAGNOSIS: TBD  Chief Complaint: Palliative Medicine follow up visit.   HISTORY OF PRESENT ILLNESS:  Vincent Ayala is a 76 y.o. year old male  with Frontotemporal dementia, depression, hypertension, insomnia, polymyalgia rheumatica.  Patient recently moved to [facility]. He is on locked unit. He is adjusting well to move per staff. He is eating well. Ambulates ad lib about facility. He has complained of left leg pain recently; receiving acetaminophen. He has orders for OT/PT. No recent falls reported. Receiving melatonin QHS for sleep.  Patient receiving napping in common area. He arouses to verbal and tactile stimulation. He is in pleasant mood, cooperative. He denies pain at present. No needs expressed at this time. Staff contribute to HPI due to patient's dementia.   History obtained from review of EMR, discussion with primary team, and interview with family, facility staff/caregiver and/or Vincent Ayala.  I reviewed available labs, medications, imaging, studies and related documents from the EMR.  Records reviewed and summarized above.   ROS  Patient with dementia.   Physical Exam: Weight: 279 pounds Pulse 72, resp 18, sats 98% on room air Constitutional: NAD General: frail appearing EYES: anicteric sclera, lids intact, no discharge  ENMT: hard of  hearing, oral mucous membranes moist, dentition intact CV:  S1S2, RRR, no LE edema Pulmonary: LCTA, no increased work of breathing, no cough, room air Abdomen: normo-active BS + 4 quadrants, soft and non tender GU: deferred MSK: moves all extremities, ambulatory Skin: warm and dry, no rashes or wounds on visible skin Neuro: + generalized weakness,  A & O to person, familiars  Psych:  non-anxious affect, cooperative Hem/lymph/immuno: no widespread bruising   Thank you for the opportunity to participate in the care of Vincent Ayala.  The palliative care team will continue to follow. Please call our office at [phone] if we can be of additional assistance.   Vincent Slocumb, NP   COVID-19 PATIENT SCREENING TOOL Asked and negative response unless otherwise noted:   Have you had symptoms of covid, tested positive or been in contact with someone with symptoms/positive test in the past 5-10 days? No

## 2022-09-08 DIAGNOSIS — F02C11 Dementia in other diseases classified elsewhere, severe, with agitation: Secondary | ICD-10-CM | POA: Diagnosis not present

## 2022-09-08 DIAGNOSIS — F331 Major depressive disorder, recurrent, moderate: Secondary | ICD-10-CM | POA: Diagnosis not present

## 2022-09-08 DIAGNOSIS — Z9183 Wandering in diseases classified elsewhere: Secondary | ICD-10-CM | POA: Diagnosis not present

## 2022-09-08 DIAGNOSIS — F5101 Primary insomnia: Secondary | ICD-10-CM | POA: Diagnosis not present

## 2022-09-08 DIAGNOSIS — G3109 Other frontotemporal dementia: Secondary | ICD-10-CM | POA: Diagnosis not present

## 2022-09-10 DIAGNOSIS — I7091 Generalized atherosclerosis: Secondary | ICD-10-CM | POA: Diagnosis not present

## 2022-09-10 DIAGNOSIS — B351 Tinea unguium: Secondary | ICD-10-CM | POA: Diagnosis not present

## 2022-09-16 DIAGNOSIS — Z9183 Wandering in diseases classified elsewhere: Secondary | ICD-10-CM | POA: Diagnosis not present

## 2022-09-16 DIAGNOSIS — E663 Overweight: Secondary | ICD-10-CM | POA: Diagnosis not present

## 2022-09-16 DIAGNOSIS — F5101 Primary insomnia: Secondary | ICD-10-CM | POA: Diagnosis not present

## 2022-09-16 DIAGNOSIS — G3109 Other frontotemporal dementia: Secondary | ICD-10-CM | POA: Diagnosis not present

## 2022-09-16 DIAGNOSIS — Z6837 Body mass index (BMI) 37.0-37.9, adult: Secondary | ICD-10-CM | POA: Diagnosis not present

## 2022-09-16 DIAGNOSIS — R269 Unspecified abnormalities of gait and mobility: Secondary | ICD-10-CM | POA: Diagnosis not present

## 2022-09-16 DIAGNOSIS — F02C11 Dementia in other diseases classified elsewhere, severe, with agitation: Secondary | ICD-10-CM | POA: Diagnosis not present

## 2022-09-16 DIAGNOSIS — M79605 Pain in left leg: Secondary | ICD-10-CM | POA: Diagnosis not present

## 2022-09-16 DIAGNOSIS — F330 Major depressive disorder, recurrent, mild: Secondary | ICD-10-CM | POA: Diagnosis not present

## 2022-09-16 DIAGNOSIS — I10 Essential (primary) hypertension: Secondary | ICD-10-CM | POA: Diagnosis not present

## 2022-09-18 DIAGNOSIS — E538 Deficiency of other specified B group vitamins: Secondary | ICD-10-CM | POA: Diagnosis not present

## 2022-09-18 DIAGNOSIS — N182 Chronic kidney disease, stage 2 (mild): Secondary | ICD-10-CM | POA: Diagnosis not present

## 2022-09-18 DIAGNOSIS — I129 Hypertensive chronic kidney disease with stage 1 through stage 4 chronic kidney disease, or unspecified chronic kidney disease: Secondary | ICD-10-CM | POA: Diagnosis not present

## 2022-09-18 DIAGNOSIS — M79605 Pain in left leg: Secondary | ICD-10-CM | POA: Diagnosis not present

## 2022-09-18 DIAGNOSIS — F02C11 Dementia in other diseases classified elsewhere, severe, with agitation: Secondary | ICD-10-CM | POA: Diagnosis not present

## 2022-09-18 DIAGNOSIS — G3109 Other frontotemporal dementia: Secondary | ICD-10-CM | POA: Diagnosis not present

## 2022-09-26 DIAGNOSIS — Z9183 Wandering in diseases classified elsewhere: Secondary | ICD-10-CM | POA: Diagnosis not present

## 2022-09-26 DIAGNOSIS — F02C11 Dementia in other diseases classified elsewhere, severe, with agitation: Secondary | ICD-10-CM | POA: Diagnosis not present

## 2022-09-26 DIAGNOSIS — D649 Anemia, unspecified: Secondary | ICD-10-CM | POA: Diagnosis not present

## 2022-09-26 DIAGNOSIS — G3109 Other frontotemporal dementia: Secondary | ICD-10-CM | POA: Diagnosis not present

## 2022-09-26 DIAGNOSIS — I10 Essential (primary) hypertension: Secondary | ICD-10-CM | POA: Diagnosis not present

## 2022-10-03 DIAGNOSIS — I129 Hypertensive chronic kidney disease with stage 1 through stage 4 chronic kidney disease, or unspecified chronic kidney disease: Secondary | ICD-10-CM | POA: Diagnosis not present

## 2022-10-03 DIAGNOSIS — E6609 Other obesity due to excess calories: Secondary | ICD-10-CM | POA: Diagnosis not present

## 2022-10-10 DIAGNOSIS — E538 Deficiency of other specified B group vitamins: Secondary | ICD-10-CM | POA: Diagnosis not present

## 2022-10-10 DIAGNOSIS — R2689 Other abnormalities of gait and mobility: Secondary | ICD-10-CM | POA: Diagnosis not present

## 2022-10-10 DIAGNOSIS — I129 Hypertensive chronic kidney disease with stage 1 through stage 4 chronic kidney disease, or unspecified chronic kidney disease: Secondary | ICD-10-CM | POA: Diagnosis not present

## 2022-10-10 DIAGNOSIS — N182 Chronic kidney disease, stage 2 (mild): Secondary | ICD-10-CM | POA: Diagnosis not present

## 2022-10-10 DIAGNOSIS — I10 Essential (primary) hypertension: Secondary | ICD-10-CM | POA: Diagnosis not present

## 2022-10-10 DIAGNOSIS — U071 COVID-19: Secondary | ICD-10-CM | POA: Diagnosis not present

## 2022-10-16 DIAGNOSIS — E6609 Other obesity due to excess calories: Secondary | ICD-10-CM | POA: Diagnosis not present

## 2022-10-16 DIAGNOSIS — G3109 Other frontotemporal dementia: Secondary | ICD-10-CM | POA: Diagnosis not present

## 2022-10-16 DIAGNOSIS — M79605 Pain in left leg: Secondary | ICD-10-CM | POA: Diagnosis not present

## 2022-10-16 DIAGNOSIS — Z6832 Body mass index (BMI) 32.0-32.9, adult: Secondary | ICD-10-CM | POA: Diagnosis not present

## 2022-10-16 DIAGNOSIS — F02C11 Dementia in other diseases classified elsewhere, severe, with agitation: Secondary | ICD-10-CM | POA: Diagnosis not present

## 2022-10-23 ENCOUNTER — Non-Acute Institutional Stay: Payer: PPO | Admitting: Student

## 2022-10-23 DIAGNOSIS — R52 Pain, unspecified: Secondary | ICD-10-CM | POA: Diagnosis not present

## 2022-10-23 DIAGNOSIS — G3109 Other frontotemporal dementia: Secondary | ICD-10-CM | POA: Diagnosis not present

## 2022-10-23 DIAGNOSIS — F0280 Dementia in other diseases classified elsewhere without behavioral disturbance: Secondary | ICD-10-CM

## 2022-10-23 DIAGNOSIS — Z515 Encounter for palliative care: Secondary | ICD-10-CM

## 2022-10-23 NOTE — Progress Notes (Signed)
Vincent Ayala Telephone: [phone]  Fax: [phone]    Date of encounter: 10/23/22 3:30PM PATIENT NAME: Vincent Ayala [address] [place] [address]   [phone] (home)  DOB: 05-Feb-1946 MRN: [id number] PRIMARY CARE PROVIDER:    Virgie Dad, NP-[facility]  REFERRING PROVIDER:   Virgie Dad, NP-[facility]   RESPONSIBLE PARTY:    Contact Information     Name Relation Home Work Mobile   [place] Spouse [phone]  [phone]   Vincent Ayala [phone]          I met face to face with patient and family in the facility. Palliative Care was asked to follow this patient by consultation request of Virgie Dad, NP-[facility]  to address advance care planning and complex medical decision making. This is a follow up visit.                                   ASSESSMENT AND PLAN / RECOMMENDATIONS:   Advance Care Planning/Goals of Care: Goals include to maximize quality of life and symptom management. Patient/health care surrogate gave his/her permission to discuss.  CODE STATUS: DNR  Education provided on Palliative Medicine. Will continue to provide supportive care, symptom management as needed.    Symptom Management/Plan:  Frontotemporal dementia-patient resides at AL on locked memory unit. He does require set up assistance with adl's such as bathing, dressing. He is adjusting well per staff. Will monitor for cognitive and functional declines.  Staff to reorient and redirect as needed, monitor for falls/safety. Monitor for agitation, behaviors. Continue memantine 10 mg daily, rivastigmine 1.5 mg BID, Seroquel 75 mg BID; trazodone PRN agitation.   Pain-Currently managed; continue acetaminophen 650 mg TID.  Follow up Palliative Care Visit: Palliative care will continue to follow for complex medical decision making, advance care planning, and clarification of goals. Return in 6-8 weeks or prn.  This visit  was coded based on medical decision making (MDM).  PPS: 50%  HOSPICE ELIGIBILITY/DIAGNOSIS: TBD  Chief Complaint: Palliative Medicine follow up visit.   HISTORY OF PRESENT ILLNESS:  Vincent Ayala is a 76 y.o. year old male  with Frontotemporal dementia, depression, hypertension, insomnia, polymyalgia rheumatica.   Patient resides at [facility] dementia on locked memory unit; he shares room with his wife. He has adjusted well. Staff report that he will sometimes decline assistance with bathing; staff report he wants to continue doing this independently. He ambulates ad lib about facility. He states his leg pain has been managed; denies pain at present. No recent falls reported. Good appetite. Patient received resting in his room. He welcomes visit. He is cooperative with assessment. Patient tested positive for Covid infection in October. Staff contributes to HPI d/t his dementia.   History obtained from review of EMR, discussion with primary team, and interview with family, facility staff/caregiver and/or Vincent Ayala.  I reviewed available labs, medications, imaging, studies and related documents from the EMR.  Records reviewed and summarized above.   ROS  Patient with dementia.   Physical Exam: Pulse 80, resp 20, sats 97% on room air Constitutional: NAD General: frail appearing, WNWD EYES: anicteric sclera, lids intact, no discharge  ENMT: intact hearing, oral mucous membranes moist, dentition intact CV: S1S2, RRR, no LE edema Pulmonary: LCTA, no increased work of breathing, no cough, room air Abdomen: normo-active BS + 4 quadrants, soft and non tender,  no ascites GU: deferred MSK: no sarcopenia, moves all extremities, ambulatory Skin: warm and dry, no rashes or wounds on visible skin Neuro: generalized weakness,  + cognitive impairment Psych: non-anxious affect, A and O to person, familiars Hem/lymph/immuno: no widespread bruising   Thank you for the  opportunity to participate in the care of Vincent Ayala. Please call our office at [phone] if we can be of additional assistance.   Ezekiel Slocumb, NP   COVID-19 PATIENT SCREENING TOOL Asked and negative response unless otherwise noted:   Have you had symptoms of covid, tested positive or been in contact with someone with symptoms/positive test in the past 5-10 days? No

## 2022-10-24 ENCOUNTER — Telehealth: Payer: Self-pay | Admitting: Student

## 2022-10-24 NOTE — Telephone Encounter (Signed)
Palliative NP spoke to son [name] to update on Palliative visit. No concerns expressed.

## 2022-11-03 DIAGNOSIS — G3109 Other frontotemporal dementia: Secondary | ICD-10-CM | POA: Diagnosis not present

## 2022-11-03 DIAGNOSIS — F02C11 Dementia in other diseases classified elsewhere, severe, with agitation: Secondary | ICD-10-CM | POA: Diagnosis not present

## 2022-11-10 DIAGNOSIS — I7091 Generalized atherosclerosis: Secondary | ICD-10-CM | POA: Diagnosis not present

## 2022-11-10 DIAGNOSIS — B351 Tinea unguium: Secondary | ICD-10-CM | POA: Diagnosis not present

## 2022-11-13 DIAGNOSIS — I129 Hypertensive chronic kidney disease with stage 1 through stage 4 chronic kidney disease, or unspecified chronic kidney disease: Secondary | ICD-10-CM | POA: Diagnosis not present

## 2022-11-13 DIAGNOSIS — F0390 Unspecified dementia without behavioral disturbance: Secondary | ICD-10-CM | POA: Diagnosis not present

## 2022-11-13 DIAGNOSIS — D649 Anemia, unspecified: Secondary | ICD-10-CM | POA: Diagnosis not present

## 2022-11-13 DIAGNOSIS — R451 Restlessness and agitation: Secondary | ICD-10-CM | POA: Diagnosis not present

## 2022-11-13 DIAGNOSIS — N1831 Chronic kidney disease, stage 3a: Secondary | ICD-10-CM | POA: Diagnosis not present

## 2022-11-13 DIAGNOSIS — M79604 Pain in right leg: Secondary | ICD-10-CM | POA: Diagnosis not present

## 2022-11-20 DIAGNOSIS — F5089 Other specified eating disorder: Secondary | ICD-10-CM | POA: Diagnosis not present

## 2022-11-20 DIAGNOSIS — R451 Restlessness and agitation: Secondary | ICD-10-CM | POA: Diagnosis not present

## 2022-11-20 DIAGNOSIS — G3109 Other frontotemporal dementia: Secondary | ICD-10-CM | POA: Diagnosis not present

## 2022-11-26 ENCOUNTER — Non-Acute Institutional Stay: Payer: PPO | Admitting: Hospice

## 2022-11-26 DIAGNOSIS — F0280 Dementia in other diseases classified elsewhere without behavioral disturbance: Secondary | ICD-10-CM

## 2022-11-26 DIAGNOSIS — Z515 Encounter for palliative care: Secondary | ICD-10-CM

## 2022-11-26 DIAGNOSIS — R451 Restlessness and agitation: Secondary | ICD-10-CM | POA: Diagnosis not present

## 2022-11-26 DIAGNOSIS — G4700 Insomnia, unspecified: Secondary | ICD-10-CM | POA: Diagnosis not present

## 2022-11-26 DIAGNOSIS — R52 Pain, unspecified: Secondary | ICD-10-CM | POA: Diagnosis not present

## 2022-11-26 DIAGNOSIS — G3109 Other frontotemporal dementia: Secondary | ICD-10-CM | POA: Diagnosis not present

## 2022-11-26 NOTE — Progress Notes (Signed)
    [facility] Consult Note Telephone: [phone]  Fax: [phone]    Date of encounter: 11/26/22 3:30PM PATIENT NAME: Vincent Ayala [address] [place] [address]   [phone] (home)  DOB: 06-12-1946 MRN: [id number] PRIMARY CARE PROVIDER:    Virgie Dad, NP-[facility]  REFERRING PROVIDER:   Virgie Dad, NP-[facility]   RESPONSIBLE PARTY:    Contact Information     Name Relation Home Work Mobile   [place] Spouse [phone]  [phone]   [place], Wilczynski [phone]          I met face to face with patient in the facility. Palliative Care was asked to follow this patient by consultation request of Virgie Dad, NP-[facility]  to address advance care planning and complex medical decision making. This is a follow up visit.                                   ASSESSMENT AND PLAN / RECOMMENDATIONS:   Advance Care Planning/Goals of Care: Goals include to maximize quality of life and symptom management. Patient/health care surrogate gave his/her permission to discuss.  CODE STATUS: DNR  Education provided on Palliative Medicine. Will continue to provide supportive care, symptom management as needed.    Symptom Management/Plan:  Frontotemporal dementia-Staff to reorient and redirect as needed, monitor for falls/safety. Monitor for agitation, behaviors. Continue memantine 10 mg daily, rivastigmine 1.5 mg BID,  Agitation: Seroquel 75 mg BID;  Insomnia: Managed with Trazodone    Pain- Generalized. Currently managed with acetaminophen 650 mg TID prn pain.  Follow up Palliative Care Visit: Palliative care will continue to follow for complex medical decision making, advance care planning, and clarification of goals. Return in 6-8 weeks or prn.   PPS: 50%  HOSPICE ELIGIBILITY/DIAGNOSIS: TBD  Chief Complaint: Palliative Medicine follow up visit.   HISTORY OF PRESENT ILLNESS:  Vincent Ayala is a 76 y.o. year old male   with Frontotemporal dementia, depression, hypertension, insomnia, polymyalgia rheumatica.  History obtained from review of EMR, discussion with primary team, and interview with family, facility staff/caregiver and/or Mr. Nyquist.  I reviewed available labs, medications, imaging, studies and related documents from the EMR.  Records reviewed and summarized above.   I spent 45 minutes providing this consultation; this includes time spent with patient/family, chart review and documentation. More than 50% of the time in this consultation was spent on counseling and coordinating communication   Thank you for the opportunity to participate in the care of Mr. Gelpi. Please call our office at [phone] if we can be of additional assistance.   Teodoro Spray, NP

## 2022-11-27 DIAGNOSIS — I129 Hypertensive chronic kidney disease with stage 1 through stage 4 chronic kidney disease, or unspecified chronic kidney disease: Secondary | ICD-10-CM | POA: Diagnosis not present

## 2022-11-27 DIAGNOSIS — G3109 Other frontotemporal dementia: Secondary | ICD-10-CM | POA: Diagnosis not present

## 2022-11-27 DIAGNOSIS — R451 Restlessness and agitation: Secondary | ICD-10-CM | POA: Diagnosis not present

## 2022-12-09 DIAGNOSIS — G3109 Other frontotemporal dementia: Secondary | ICD-10-CM | POA: Diagnosis not present

## 2022-12-09 DIAGNOSIS — M79604 Pain in right leg: Secondary | ICD-10-CM | POA: Diagnosis not present

## 2022-12-09 DIAGNOSIS — I129 Hypertensive chronic kidney disease with stage 1 through stage 4 chronic kidney disease, or unspecified chronic kidney disease: Secondary | ICD-10-CM | POA: Diagnosis not present

## 2022-12-09 DIAGNOSIS — F331 Major depressive disorder, recurrent, moderate: Secondary | ICD-10-CM | POA: Diagnosis not present

## 2022-12-09 DIAGNOSIS — F5101 Primary insomnia: Secondary | ICD-10-CM | POA: Diagnosis not present

## 2022-12-09 DIAGNOSIS — M79605 Pain in left leg: Secondary | ICD-10-CM | POA: Diagnosis not present

## 2022-12-09 DIAGNOSIS — N1831 Chronic kidney disease, stage 3a: Secondary | ICD-10-CM | POA: Diagnosis not present

## 2022-12-09 DIAGNOSIS — W010XXA Fall on same level from slipping, tripping and stumbling without subsequent striking against object, initial encounter: Secondary | ICD-10-CM | POA: Diagnosis not present

## 2022-12-11 DIAGNOSIS — E669 Obesity, unspecified: Secondary | ICD-10-CM | POA: Diagnosis not present

## 2022-12-11 DIAGNOSIS — D509 Iron deficiency anemia, unspecified: Secondary | ICD-10-CM | POA: Diagnosis not present

## 2022-12-12 DIAGNOSIS — F02C11 Dementia in other diseases classified elsewhere, severe, with agitation: Secondary | ICD-10-CM | POA: Diagnosis not present

## 2022-12-12 DIAGNOSIS — G3109 Other frontotemporal dementia: Secondary | ICD-10-CM | POA: Diagnosis not present

## 2022-12-12 DIAGNOSIS — F5101 Primary insomnia: Secondary | ICD-10-CM | POA: Diagnosis not present

## 2022-12-12 DIAGNOSIS — F330 Major depressive disorder, recurrent, mild: Secondary | ICD-10-CM | POA: Diagnosis not present

## 2022-12-24 ENCOUNTER — Non-Acute Institutional Stay: Payer: PPO | Admitting: Hospice

## 2022-12-24 DIAGNOSIS — R451 Restlessness and agitation: Secondary | ICD-10-CM

## 2022-12-24 DIAGNOSIS — F0280 Dementia in other diseases classified elsewhere without behavioral disturbance: Secondary | ICD-10-CM

## 2022-12-24 DIAGNOSIS — R52 Pain, unspecified: Secondary | ICD-10-CM

## 2022-12-24 DIAGNOSIS — G4700 Insomnia, unspecified: Secondary | ICD-10-CM

## 2022-12-24 DIAGNOSIS — Z515 Encounter for palliative care: Secondary | ICD-10-CM

## 2022-12-24 NOTE — Progress Notes (Signed)
    [facility] Consult Note Telephone: [phone]  Fax: [phone]    Date of encounter: 12/24/22 3:30PM PATIENT NAME: Vincent Ayala [address] [place] [place] [address]   [phone] (home)  DOB: [birth year] MRN: [id number] PRIMARY CARE PROVIDER:    Virgie Dad, NP-[facility]  REFERRING PROVIDER:   Virgie Dad, NP-[facility]   RESPONSIBLE PARTY:    Contact Information     Name Relation Home Work Mobile   [place] Spouse [phone]  [phone]   [place], Elman [phone]          I met face to face with patient and family in the facility. Palliative Care was asked to follow this patient by consultation request of Vincent Dad, NP-[facility]  to address advance care planning and complex medical decision making. This is a follow up visit.                                   ASSESSMENT AND PLAN / RECOMMENDATIONS:   Advance Care Planning/Goals of Care: Goals include to maximize quality of life and symptom management. Patient/health care surrogate gave his/her permission to discuss.  CODE STATUS: DNR   Symptom Management/Plan:  Frontotemporal dementia- Memory loss/confusion  with occasional agitation. Staff to reorient and redirect as needed, monitor for falls/safety. Monitor for agitation, behaviors. Continue memantine 10 mg daily, rivastigmine 1.5 mg BID,   Agitation: Continue Seroquel as ordered. Psych consult as planned/as needed.  Insomnia: Sleep hygiene discussed. Continue Trazodone as ordered.    Pain- Left knee. Continue acetaminophen 650 mg TID prn pain.  Follow up Palliative Care Visit: Palliative care will continue to follow for complex medical decision making, advance care planning, and clarification of goals. Return in 6-8 weeks or prn.  This visit was coded based on medical decision making (MDM).  PPS: 50%  HOSPICE ELIGIBILITY/DIAGNOSIS: TBD  Chief Complaint: Palliative Medicine follow up visit.    HISTORY OF PRESENT ILLNESS:  Vincent Ayala is a 77 y.o. year old male  with Frontotemporal dementia, depression, hypertension, insomnia, polymyalgia rheumatica.  History obtained from review of EMR, discussion with primary team, and interview with family, facility staff/caregiver and/or Vincent Ayala.  A 10 point ROS asked and negative. I reviewed available labs, medications, imaging, studies and related documents from the EMR.  Records reviewed and summarized above.  I spent 40 minutes providing this consultation; time includes spent with patient/family, chart review and documentation. More than 50% of the time in this consultation was spent on care coordination.   Thank you for the opportunity to participate in the care of Vincent Ayala. Please call our office at [phone] if we can be of additional assistance.   Teodoro Spray, NP

## 2023-01-11 ENCOUNTER — Other Ambulatory Visit: Payer: Self-pay

## 2023-01-11 ENCOUNTER — Emergency Department
Admission: EM | Admit: 2023-01-11 | Discharge: 2023-01-11 | Disposition: A | Discharge status: Skilled Nursing Facility | Payer: Medicare Other | Attending: Emergency Medicine | Admitting: Emergency Medicine

## 2023-01-11 ENCOUNTER — Emergency Department: Payer: Medicare Other

## 2023-01-11 DIAGNOSIS — S0993XA Unspecified injury of face, initial encounter: Secondary | ICD-10-CM | POA: Diagnosis present

## 2023-01-11 DIAGNOSIS — F0390 Unspecified dementia without behavioral disturbance: Secondary | ICD-10-CM | POA: Insufficient documentation

## 2023-01-11 DIAGNOSIS — S0081XA Abrasion of other part of head, initial encounter: Secondary | ICD-10-CM | POA: Insufficient documentation

## 2023-01-11 DIAGNOSIS — I10 Essential (primary) hypertension: Secondary | ICD-10-CM | POA: Insufficient documentation

## 2023-01-11 DIAGNOSIS — W19XXXA Unspecified fall, initial encounter: Secondary | ICD-10-CM | POA: Insufficient documentation

## 2023-01-11 NOTE — ED Triage Notes (Signed)
Arrived by [facility] for fall while walking down the hall of [facility] memory care.  Did not lose consciousness.  Pt is alert and oriented x2.  Room air, lacerations on forehead and nose.  VSS per EMS.

## 2023-01-11 NOTE — ED Notes (Signed)
Pt wheeled to front circle and picked up by son who is taking him back to nursing home.  Pt was able to ambulate from wheelchair to vehicle without difficulty. Discharge papers given to son along with all belongings.

## 2023-01-11 NOTE — ED Provider Notes (Signed)
[facility] Provider Note    Event Date/Time   First MD Initiated Contact with Patient 01/11/23 1228     (approximate)   History   Chief Complaint Fall   HPI  Vincent Ayala is a 77 y.o. male with past medical history of hypertension, polymyalgia rheumatica, and frontotemporal dementia who presents to the ED following fall.  Per EMS, patient had a witnessed fall at [facility] where he lost his balance and fell forward, striking his head on the ground.  He did not lose consciousness and does not take any blood thinners.  Patient complains of headache and pain along the left side of his face, denies any pain in his neck or extremities.  He is at his baseline mental status per EMS.     Physical Exam   Triage Vital Signs: ED Triage Vitals  Enc Vitals Group     BP      Pulse      Resp      Temp      Temp src      SpO2      Weight      Height      Head Circumference      Peak Flow      Pain Score      Pain Loc      Pain Edu?      Excl. in [place]?     Most recent vital signs: Vitals:   01/11/23 1229 01/11/23 1230  BP: (!) 144/122 (!) 159/89  Pulse: 78 76  Resp: 18 10  Temp: (!) 97.5 F (36.4 C)   SpO2: 100% 99%    Constitutional: Alert and oriented to person and place, but not time or situation. Eyes: Conjunctivae are normal. Head: Abrasion over left frontal scalp, left nose, and left cheek with no hematoma or step-off. Nose: No congestion/rhinnorhea. Mouth/Throat: Mucous membranes are moist.  Neck: No midline cervical spine tenderness to palpation. Cardiovascular: Normal rate, regular rhythm. Grossly normal heart sounds.  2+ radial pulses bilaterally. Respiratory: Normal respiratory effort.  No retractions. Lungs CTAB.  No chest wall tenderness to palpation. Gastrointestinal: Soft and nontender. No distention. Musculoskeletal: No lower extremity tenderness nor edema.  No upper extremity bony tenderness to palpation.  No midline  thoracic or lumbar spinal tenderness to palpation. Neurologic:  Normal speech and language. No gross focal neurologic deficits are appreciated.    ED Results / Procedures / Treatments   Labs (all labs ordered are listed, but only abnormal results are displayed) Labs Reviewed - No data to display   EKG  ED ECG REPORT I, Blake Divine, the attending physician, personally viewed and interpreted this ECG.   Date: 01/11/2023  EKG Time: 12:31  Rate: 73  Rhythm: normal sinus rhythm  Axis: Normal  Intervals:none  ST&T Change: None  PROCEDURES:  Critical Care performed: No  Procedures   MEDICATIONS ORDERED IN ED: Medications - No data to display   IMPRESSION / MDM / [place] / ED COURSE  I reviewed the triage vital signs and the nursing notes.                              77 y.o. male with past medical history of hypertension, polymyalgia rheumatica, and frontotemporal dementia who presents to the ED following witnessed fall at his nursing facility just prior to arrival, striking his head.  Patient's presentation is most consistent  with acute complicated illness / injury requiring diagnostic workup.  Differential diagnosis includes, but is not limited to, intracranial hemorrhage, cervical spine fracture, skull fracture, facial fracture, head contusion.  Patient nontoxic-appearing and in no acute distress, vital signs are unremarkable.  He has abrasion to the left side of his face with associated tenderness, will check CT head, maxillofacial, and cervical spine.  No evidence of injury to his trunk or extremities and patient declines pain medication.  He appears at his baseline mental status with no focal neurologic deficits.  Patient's son now at bedside, states he is patient's medical POA and would like to hold off on any CT imaging.  I explained that we would be unable to rule out any significant traumatic injury to his head, neck, or face without this imaging and  son expresses understanding.  He states that even if we were to find a significant injury, they would not want to proceed with any intervention, which is reasonable given patient's advanced dementia.  Son is requesting to have patient discharged home to his nursing facility, he was counseled that they may return to the ED at any time for reevaluation if desired.  Son agrees with plan.      FINAL CLINICAL IMPRESSION(S) / ED DIAGNOSES   Final diagnoses:  Fall, initial encounter  Abrasion of face, initial encounter     Rx / DC Orders   ED Discharge Orders     None        Note:  This document was prepared using Dragon voice recognition software and may include unintentional dictation errors.   Blake Divine, MD 01/11/23 1322

## 2023-01-23 ENCOUNTER — Non-Acute Institutional Stay: Payer: PPO | Admitting: Hospice

## 2023-01-23 DIAGNOSIS — Z515 Encounter for palliative care: Secondary | ICD-10-CM

## 2023-01-23 DIAGNOSIS — G4700 Insomnia, unspecified: Secondary | ICD-10-CM

## 2023-01-23 DIAGNOSIS — W19XXXD Unspecified fall, subsequent encounter: Secondary | ICD-10-CM

## 2023-01-23 DIAGNOSIS — R451 Restlessness and agitation: Secondary | ICD-10-CM

## 2023-01-23 DIAGNOSIS — F0280 Dementia in other diseases classified elsewhere without behavioral disturbance: Secondary | ICD-10-CM

## 2023-01-23 DIAGNOSIS — R52 Pain, unspecified: Secondary | ICD-10-CM

## 2023-01-23 NOTE — Progress Notes (Signed)
    [facility] Consult Note Telephone: [phone]  Fax: [phone]    Date of encounter: 01/23/23 3:30PM PATIENT NAME: Vincent Ayala [id number]   [phone] (home)  DOB: December 02, 1946 MRN: [id number] PRIMARY CARE PROVIDER:    Virgie Dad, NP-[facility]  REFERRING PROVIDER:   Virgie Dad, NP-[facility]   RESPONSIBLE PARTY:   Isaac Bliss Information     Name Relation Home Work Mobile   Tarez, Bowns [phone]          I met face to face with patient and family in the facility. Palliative Care was asked to follow this patient by consultation request of Virgie Dad, NP-[facility]  to address advance care planning and complex medical decision making. This is a follow up visit. NP called [name] and updated him on visit.  He reiterated that he wants patient comfortable, no aggressive treatments.  Family is open to hospice service in the future when patient qualifies for it.                                   ASSESSMENT AND PLAN / RECOMMENDATIONS:   Advance Care Planning/Goals of Care: Goals include to maximize quality of life and symptom management. Patient/health care surrogate gave his/her permission to discuss.  CODE STATUS: DNR   Symptom Management/Plan:  Fall: Seen in the ED 01/11/2023 for fall.  CT imaging not done due to advanced dementia, per son's request. Patient is back to baseline with no noticeable neurological deficits.  Abrasion to left side of his face healing well.  PT/OT is ongoing for gait training and ambulation.  NP reiterated the use of rolling walker to help provide support and prevent fall. 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 Memory loss/confusion  with occasional agitation. Staff to reorient and redirect as needed, monitor for falls/safety. Monitor for agitation, behaviors.  Agitation: Managed with Seroquel. Psych consult as planned/as  needed.   Insomnia: Sleep hygiene discussed. Continue Trazodone as ordered.    Pain- Left knee. Continue acetaminophen 650 mg TID prn pain.  Follow up Palliative Care Visit: Palliative care will continue to follow for complex medical decision making, advance care planning, and clarification of goals. Return in 6-8 weeks or prn.  PPS: 50%  HOSPICE ELIGIBILITY/DIAGNOSIS: TBD  Chief Complaint: Palliative Medicine follow up visit.   HISTORY OF PRESENT ILLNESS:  Vincent Ayala is a 77 y.o. year old male  with Frontotemporal dementia, depression, hypertension, insomnia, polymyalgia rheumatica.  Patient denies pain/discomfort, in no acute distress.  Nursing with no concerns today. History obtained from review of EMR, discussion with primary team, and interview with family, facility staff/caregiver and/or Mr. Schnoor.  A 10 point ROS asked and negative. I reviewed available labs, medications, imaging, studies and related documents from the EMR.  Records reviewed and summarized above.   I spent 60 minutes providing this consultation; time includes spent with patient/family, chart review and documentation. More than 50% of the time in this consultation was spent on care coordination.   Thank you for the opportunity to participate in the care of Vincent Ayala. Please call our office at [phone] if we can be of additional assistance.   Teodoro Spray, NP

## 2023-03-02 ENCOUNTER — Non-Acute Institutional Stay: Payer: PPO | Admitting: Hospice

## 2023-03-02 DIAGNOSIS — R52 Pain, unspecified: Secondary | ICD-10-CM

## 2023-03-02 DIAGNOSIS — G4700 Insomnia, unspecified: Secondary | ICD-10-CM

## 2023-03-02 DIAGNOSIS — R451 Restlessness and agitation: Secondary | ICD-10-CM

## 2023-03-02 DIAGNOSIS — Z515 Encounter for palliative care: Secondary | ICD-10-CM

## 2023-03-02 DIAGNOSIS — W19XXXD Unspecified fall, subsequent encounter: Secondary | ICD-10-CM

## 2023-03-02 DIAGNOSIS — F0280 Dementia in other diseases classified elsewhere without behavioral disturbance: Secondary | ICD-10-CM

## 2023-03-02 NOTE — Progress Notes (Signed)
    [facility] Consult Note Telephone: [phone]  Fax: [phone]    Date of encounter: 03/02/23 3:30PM PATIENT NAME: Vincent Ayala [id number]   [phone] (home)  DOB: 01/26/1946 MRN: [id number] PRIMARY CARE PROVIDER:    Virgie Dad, NP-[facility]  REFERRING PROVIDER:   Virgie Dad, NP-[facility]   RESPONSIBLE PARTY:   Vincent Ayala Information     Name Relation Home Work Mobile   [name], [name] [phone]          I met face to face with patient and family in the facility. Palliative Care was asked to follow this patient by consultation request of Vincent Dad, NP-[facility]  to address advance care planning and complex medical decision making. This is a follow up visit.                                    ASSESSMENT AND PLAN / RECOMMENDATIONS:   Advance Care Planning/Goals of Care: Goals include to maximize quality of life and symptom management.  Comfort care only, no aggressive measures Vincent Ayala. Family is open to hospice service in the future when patient qualifies for it.  CODE STATUS: DNR   Symptom Management/Plan:  Fall: PT/OT is ongoing for gait training and ambulation. Continue Fall precautions.  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 Memory loss/confusion  with occasional agitation. Provide assistance with ADLs as needed, monitor for falls/safety. Monitor for agitation, behaviors.  Agitation: Managed with Seroquel. Psych consult as planned/as needed.   Insomnia: Continue Trazodone as ordered. Sleep hygiene.    Pain- Left knee. Chronic/Stable. Continue acetaminophen and Tramadol as ordered.   Follow up Palliative Care Visit: Palliative care will continue to follow for complex medical decision making, advance care planning, and clarification of goals. Return in 6-8 weeks or prn.  PPS: 50%  HOSPICE ELIGIBILITY/DIAGNOSIS: TBD  Chief  Complaint: Palliative Medicine follow up visit.   HISTORY OF PRESENT ILLNESS:  Vincent Ayala is a 77 y.o. year old male  with Frontotemporal dementia, depression, hypertension, insomnia, polymyalgia rheumatica.  Patient denies pain/discomfort, in no acute distress. FLACC 0.  Nursing with no concerns today. History obtained from review of EMR, discussion with primary team, and interview with family, facility staff/caregiver and/or Vincent Ayala.  A 10 point ROS asked and negative. I reviewed available labs, medications, imaging, studies and related documents from the EMR.  Records reviewed and summarized above.   I spent 40 minutes providing this consultation; time includes spent with patient/family, chart review and documentation. More than 50% of the time in this consultation was spent on care coordination.   Thank you for the opportunity to participate in the care of Vincent Ayala. Please call our office at [phone] if we can be of additional assistance.   Teodoro Spray, NP

## 2023-03-17 ENCOUNTER — Emergency Department
Admission: EM | Admit: 2023-03-17 | Discharge: 2023-03-17 | Disposition: A | Discharge status: Home / Self Care | Payer: Medicare Other | Attending: Emergency Medicine | Admitting: Emergency Medicine

## 2023-03-17 ENCOUNTER — Other Ambulatory Visit: Payer: Self-pay

## 2023-03-17 DIAGNOSIS — Z0489 Encounter for examination and observation for other specified reasons: Secondary | ICD-10-CM | POA: Diagnosis not present

## 2023-03-17 DIAGNOSIS — Z5321 Procedure and treatment not carried out due to patient leaving prior to being seen by health care provider: Secondary | ICD-10-CM | POA: Insufficient documentation

## 2023-03-17 LAB — BASIC METABOLIC PANEL
Anion gap: 9 (ref 5–15)
BUN: 17 mg/dL (ref 8–23)
CO2: 26 mmol/L (ref 22–32)
Calcium: 9.4 mg/dL (ref 8.9–10.3)
Chloride: 104 mmol/L (ref 98–111)
Creatinine, Ser: 1.26 mg/dL — ABNORMAL HIGH (ref 0.61–1.24)
GFR, Estimated: 59 mL/min — ABNORMAL LOW (ref >60)
Glucose, Bld: 103 mg/dL — ABNORMAL HIGH (ref 70–99)
Potassium: 4 mmol/L (ref 3.5–5.1)
Sodium: 139 mmol/L (ref 135–145)

## 2023-03-17 LAB — CBC
HCT: 44.7 % (ref 39.0–52.0)
Hemoglobin: 14.8 g/dL (ref 13.0–17.0)
MCH: 31.5 pg (ref 26.0–34.0)
MCHC: 33.1 g/dL (ref 30.0–36.0)
MCV: 95.1 fL (ref 80.0–100.0)
Platelets: 299 10*3/uL (ref 150–400)
RBC: 4.7 MIL/uL (ref 4.22–5.81)
RDW: 12.6 % (ref 11.5–15.5)
WBC: 8.3 10*3/uL (ref 4.0–10.5)
nRBC: 0 % (ref 0.0–0.2)

## 2023-03-17 NOTE — ED Notes (Addendum)
Pt does not want EKG at this time.

## 2023-03-17 NOTE — ED Triage Notes (Signed)
First nurse note: Pt here from [facility] from [facility] with c/o of having issues arousing pt. Pt alert now.   158/130 CBG 123

## 2023-03-17 NOTE — ED Triage Notes (Signed)
Pt here after not being responsive at the nursing home. Pt denies pain. Pt states nothing is wrong.

## 2023-05-14 ENCOUNTER — Non-Acute Institutional Stay: Payer: PPO | Admitting: Hospice

## 2023-05-14 DIAGNOSIS — R451 Restlessness and agitation: Secondary | ICD-10-CM

## 2023-05-14 DIAGNOSIS — Z515 Encounter for palliative care: Secondary | ICD-10-CM

## 2023-05-14 DIAGNOSIS — G4700 Insomnia, unspecified: Secondary | ICD-10-CM

## 2023-05-14 DIAGNOSIS — F0280 Dementia in other diseases classified elsewhere without behavioral disturbance: Secondary | ICD-10-CM

## 2023-05-14 DIAGNOSIS — R269 Unspecified abnormalities of gait and mobility: Secondary | ICD-10-CM

## 2023-05-14 NOTE — Progress Notes (Signed)
    Therapist, nutritional Palliative Care Consult Note Telephone: [phone]  Fax: [phone]    Date of encounter: 05/14/23 3:30PM PATIENT NAME: Vincent Ayala [address] [place] [address]   [phone] (home)  DOB: 1946/10/15 MRN: [id number] PRIMARY CARE PROVIDER:    Melonie Florida, NP-[facility]  REFERRING PROVIDER:   Melonie Florida, NP-[facility]   RESPONSIBLE PARTY:   Vincent Ayala Information     Name Relation Home Work Mobile   Vincent Ayala, Vincent Ayala [phone]          I met face to face with patient and family in the facility. Palliative Care was asked to follow this patient by consultation request of Vincent Florida, NP-[facility]  to address advance care planning and complex medical decision making. This is a follow up visit.                                    ASSESSMENT AND PLAN / RECOMMENDATIONS:   Advance Care Planning/Goals of Care: Goals include to maximize quality of life and symptom management.  Comfort care only, no aggressive measures Vincent Ayala. Family is open to hospice service in the future when patient qualifies for it.  CODE STATUS: DNR   Symptom Management/Plan: Comfort care only.  No ED visits or exacerbations/acuities since last visit.  Gait disturbance: Continue to use assistive device - walker for mobility to help prevent fall. PT/OT is ongoing for gait training and ambulation. Continue Fall precautions.  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 Memory loss/confusion  with occasional agitation. Provide assistance with ADLs as needed, monitor for falls/safety. Monitor for agitation, behaviors.  Agitation: Managed with Seroquel. Psych consult as planned/as needed.   Insomnia: Continue Trazodone as ordered. Sleep hygiene.    Pain- Left knee. Chronic/Stable. Continue acetaminophen and Tramadol as ordered.   Follow up Palliative Care Visit: Palliative care will continue to follow for complex  medical decision making, advance care planning, and clarification of goals. Return in 6-8 weeks or prn.  PPS: 50%  HOSPICE ELIGIBILITY/DIAGNOSIS: TBD  Chief Complaint: Palliative Medicine follow up visit.   HISTORY OF PRESENT ILLNESS:  Vincent Ayala is a 77 y.o. year old male  with Frontotemporal dementia, depression, hypertension, insomnia, polymyalgia rheumatica.  Patient denies pain/discomfort, in no acute distress. FLACC 0.  Nursing with no concerns today, no hospitalizations since last visit. History obtained from review of EMR, discussion with primary team, and interview with family, facility staff/caregiver and/or Vincent Ayala.  A 10 point ROS asked and negative. I reviewed available labs, medications, imaging, studies and related documents from the EMR.  Records reviewed and summarized above.   I spent 40 minutes providing this consultation; time includes spent with patient/family, chart review and documentation. More than 50% of the time in this consultation was spent on care coordination.   Thank you for the opportunity to participate in the care of Vincent Ayala. Please call our office at [phone] if we can be of additional assistance.   Vincent Carpenter, NP

## 2023-05-26 ENCOUNTER — Non-Acute Institutional Stay: Payer: PPO | Admitting: Hospice

## 2023-05-26 DIAGNOSIS — R451 Restlessness and agitation: Secondary | ICD-10-CM

## 2023-05-26 DIAGNOSIS — F0280 Dementia in other diseases classified elsewhere without behavioral disturbance: Secondary | ICD-10-CM

## 2023-05-26 DIAGNOSIS — G4700 Insomnia, unspecified: Secondary | ICD-10-CM

## 2023-05-26 DIAGNOSIS — Z515 Encounter for palliative care: Secondary | ICD-10-CM

## 2023-05-26 DIAGNOSIS — R634 Abnormal weight loss: Secondary | ICD-10-CM

## 2023-05-26 NOTE — Progress Notes (Signed)
Therapist, nutritional Palliative Care Consult Note Telephone: [phone]  Fax: [phone]    Date of encounter: 05/26/23 3:30PM PATIENT NAME: Vincent Ayala [address] [place] [address]   [phone] (home)  DOB: 1946-10-27 MRN: [id number] PRIMARY CARE PROVIDER:    Melonie Florida, NP-[facility]  REFERRING PROVIDER:   Melonie Florida, NP-[facility]   RESPONSIBLE PARTY:   Arlyss Repress Information     Name Relation Home Work Mobile   Vincent Ayala [phone]          I met face to face with patient and family in the facility. Palliative Care was asked to follow this patient by consultation request of Melonie Florida, NP-[facility]  to address advance care planning and complex medical decision making. This is a follow up visit following hospice evaluation 05/25/23 which patient did not qualify.  NP called Vincent Ayala and updated him on visit, and further discussed hospice criteria eligibility.  He was assured of close monitoring and recommendation for hospice services once patient qualifies for it.  He expressed appreciation for the call.                                    ASSESSMENT AND PLAN / RECOMMENDATIONS:   Advance Care Planning/Goals of Care: Goals include to maximize quality of life and symptom management.  Comfort care only, no aggressive measures Vincent Ayala. Family is open to hospice service in the future when patient qualifies for it.  CODE STATUS: DNR   Symptom Management/Plan: Comfort care only.  Weight loss: Patient's weight in December 2023 was 290 lbs, 05/25/2023 - 279.4 lbs.  Loss of 10.6 lbs in 6 months, which is a 3.6% wt loss.  Height 6 feet 6 inches. Monitor weight closely Patient's appetite is good, eats at least 75% of his meals. Offer assistance as needed during meals to ensure adequate oral intake.    Gait disturbance: Maintain fall precautions.  Continue to use assistive device - walker for mobility to help prevent fall. PT/OT is ongoing  for gait training and ambulation.   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  Ongoing memory loss/confusion  with occasional agitation. Provide cueing and redirection as needed, provide assistance with ADLs as needed, monitor for falls/safety. Monitor for agitation, behaviors.   Agitation: Managed with Seroquel. Psych consult as planned/as needed.   Insomnia: Continue Trazodone as ordered. Sleep hygiene.    Pain- Left knee. Chronic/Stable. Continue acetaminophen and Tramadol as ordered.    Follow up Palliative Care Visit: Palliative care will continue to follow for complex medical decision making, advance care planning, and clarification of goals. Return in 6-8 weeks or prn.  PPS: 50%  HOSPICE ELIGIBILITY/DIAGNOSIS: TBD  Chief Complaint: Palliative Medicine follow up visit.   HISTORY OF PRESENT ILLNESS:  Vincent Ayala is a 77 y.o. year old male  with Frontotemporal dementia, depression, hypertension, insomnia, polymyalgia rheumatica.  Patient denies pain/discomfort, in no acute distress. FLACC 0. Discussion with Nurse supervisor Beth on patient's functional status , and hospice eligibility criteria.  Nursing with no concerns today. History obtained from review of EMR, discussion with primary team, and interview with family, facility staff/caregiver and/or Vincent Ayala.  A 10 point ROS asked and negative. I reviewed available labs, medications, imaging, studies and related documents from the EMR.  Records reviewed and summarized above.   I spent 60 minutes providing this consultation; time includes  spent with patient/family, chart review and documentation. More than 50% of the time in this consultation was spent on care coordination.   Thank you for the opportunity to participate in the care of Vincent Ayala. Please call our office at [phone] if we can be of additional assistance.   Rosaura Carpenter, NP

## 2024-06-05 ENCOUNTER — Emergency Department: Admission: EM | Admit: 2024-06-05 | Discharge: 2024-06-05 | Disposition: A | Discharge status: Home / Self Care

## 2024-06-05 ENCOUNTER — Other Ambulatory Visit: Payer: Self-pay

## 2024-06-05 ENCOUNTER — Emergency Department

## 2024-06-05 DIAGNOSIS — W08XXXA Fall from other furniture, initial encounter: Secondary | ICD-10-CM | POA: Diagnosis not present

## 2024-06-05 DIAGNOSIS — I10 Essential (primary) hypertension: Secondary | ICD-10-CM | POA: Insufficient documentation

## 2024-06-05 DIAGNOSIS — S0990XA Unspecified injury of head, initial encounter: Secondary | ICD-10-CM | POA: Diagnosis present

## 2024-06-05 DIAGNOSIS — S0081XA Abrasion of other part of head, initial encounter: Secondary | ICD-10-CM | POA: Insufficient documentation

## 2024-06-05 DIAGNOSIS — G3109 Other frontotemporal dementia: Secondary | ICD-10-CM | POA: Insufficient documentation

## 2024-06-05 DIAGNOSIS — W19XXXA Unspecified fall, initial encounter: Secondary | ICD-10-CM

## 2024-06-05 NOTE — ED Triage Notes (Signed)
 Pt to ED via [facility] from [facility] for unwitnessed fall. Pt stated he rolled off the couch. The time of the fall is unknown. Pt has abrasion to forehead.   Vitals were within normal limits for EMS. Pt does not have any blood thinners on medication list.

## 2024-06-05 NOTE — Discharge Instructions (Signed)
 Your father's evaluation in the emergency department was overall reassuring, though this was limited by a lack of imaging.  You can return to the hospital at any time if his goals of care decisions change for reevaluation.  Otherwise please follow-up with his primary care provider.  Take care.

## 2024-06-05 NOTE — ED Provider Notes (Signed)
 [facility] Provider Note    Event Date/Time   First MD Initiated Contact with Patient 06/05/24 [phone]     (approximate)   History   Fall  Pt to ED via [facility] from [facility] for unwitnessed fall. Pt stated he rolled off the couch. The time of the fall is unknown. Pt has abrasion to forehead.   Vitals were within normal limits for EMS. Pt does not have any blood thinners on medication list.      HPI Vincent Ayala is a 78 y.o. male PMH frontotemporal dementia, hypertension, polymyalgia rheumatica presents for evaluation after fall - Patient reportedly rolled out of his bed hitting his head.  No known LOC.  Abrasion to forehead.  At baseline mental state.  Hemodynamically stable.  Previously complained of knee pain. - On my evaluation patient is not able to provide a clear story though does state that he fell out of bed.  Denies any pain.  Per chart review, seen in emergency department in January 2024 for similar.  Did not pursue imaging at that time has any findings would not be actionable per goals of care.      Physical Exam   Triage Vital Signs: ED Triage Vitals  Encounter Vitals Group     BP 06/05/24 0656 124/78     Girls Systolic BP Percentile --      Girls Diastolic BP Percentile --      Boys Systolic BP Percentile --      Boys Diastolic BP Percentile --      Pulse Rate 06/05/24 0656 70     Resp 06/05/24 0656 18     Temp 06/05/24 0656 98.2 F (36.8 C)     Temp src --      SpO2 06/05/24 0656 98 %     Weight --      Height --      Head Circumference --      Peak Flow --      Pain Score 06/05/24 0655 0     Pain Loc --      Pain Education --      Exclude from Growth Chart --     Most recent vital signs: Vitals:   06/05/24 0656  BP: 124/78  Pulse: 70  Resp: 18  Temp: 98.2 F (36.8 C)  SpO2: 98%     General: Awake, no distress.  HEENT:  Abrasion to forehead, no lacerations, no other evidence of injury Neck/back: No  midline tenderness, full range of motion of neck CV:  Good peripheral perfusion. RRR, RP 2+ Resp:  Normal effort. CTAB Abd:  No distention. Nontender to deep palpation throughout Other:  Full range of motion of all joints.  No tenderness to palpation throughout.  Does have mild bruising to bilateral knees, no effusion or deformity appreciated.   ED Results / Procedures / Treatments   Labs (all labs ordered are listed, but only abnormal results are displayed) Labs Reviewed - No data to display   EKG  N/a   RADIOLOGY Declined by patient / POA    PROCEDURES:  Critical Care performed: No  Procedures   MEDICATIONS ORDERED IN ED: Medications - No data to display   IMPRESSION / MDM / ASSESSMENT AND PLAN / ED COURSE  I reviewed the triage vital signs and the nursing notes.  DDX/MDM/AP: Differential diagnosis includes, but is not limited to, likely uncomplicated fall from bed, doubt underlying organic etiology at this time.  No recent infectious symptoms or change in mental state to suggest this.  With regard to trauma, consider skull fracture, intracranial hemorrhage, C-spine fracture.  Considered but doubt knee fractures, do not suspect dislocation.  Will plan for screening imaging.  Plan: - CT head, CT C-spine - X-ray of bilateral knees - Patient declined Tylenol   Patient's presentation is most consistent with acute presentation with potential threat to life or bodily function.   ED course below.  Patient declined above imaging.  In discussion with son/POA (who appears appropriately concerned and involved in care), would not like to pursue imaging as would not take any action on any findings--not within patient's goals of care and his advanced dementia state.  Patient does appear quite well here beyond the forehead abrasion.  Mild bruising to bilateral knees though patient's son states he has been complaining of bilateral knee pain for a long  time now.  POA understands that we may miss diagnoses of life-threatening injury such as intracranial hemorrhage or C-spine injury by not pursuing imaging.  I believe this is a reasonable plan in this elderly patient with advanced dementia.  Discharged back to facility with son bedside.  Family aware they can return to the emergency department at any time if they decide they would like further workup.    Clinical Course as of 06/05/24 9255  Sun Jun 05, 2024  0709 Spoke w/ Vincent Ayala at [facility], took over for provider from last night, familiar with patient - fell from bed, hit his head, c/o knee pain - at baseline mental state after fall, mild agitation at baseline - DNR/DNI - not on blood thinners - otherwise in usual state of health recently - Does not believe patient was down for a prolonged period of time [MM]  0720 Patient declining transfer to CT  Per chart review, on last visit for fall with headstrike in 2024, POA/son declined imaging as it would not be actionable per GOC if anything were found.   Attempted to call son, Vincent Ayala at number listed on paperwork, no answer. Will try again shortly.  [MM]  0721 Spoke w/ son Vincent Ayala -Discussed that patient is declining imaging, would potentially require sedation if we were to obtain imaging -Son/POA prefers to defer any imaging  - would not want to take action if any pathology was found  Declines imaging. Understands we would not be able to diagnose life-threatening issues without this imaging such as intracranial hemorrhage, C-spine fracture--confirms treating these would not be within goals of care.  I believe this is very reasonable in this elderly patient with advanced dementia.  Son will be here shortly. [MM]    Clinical Course User Index [MM] Clarine Ozell LABOR, MD     FINAL CLINICAL IMPRESSION(S) / ED DIAGNOSES   Final diagnoses:  Fall, initial encounter  Abrasion of forehead, initial encounter     Rx / DC Orders    ED Discharge Orders     None        Note:  This document was prepared using Dragon voice recognition software and may include unintentional dictation errors.   Clarine Ozell LABOR, MD 06/05/24 [phone]

## 2024-06-05 NOTE — ED Notes (Signed)
 Pt refusing CT and x-ray.  Dr. Clarine notified.
